# Patient Record
Sex: Female | Born: 1995 | Race: Asian | Hispanic: No | Marital: Married | Smoking: Never smoker
Health system: Southern US, Community
[De-identification: ages and names within clinical notes are randomized; demographics above are authoritative.]

## PROBLEM LIST (undated history)

## (undated) ENCOUNTER — Emergency Department (HOSPITAL_COMMUNITY): Admission: EM | Payer: Medicaid Other

## (undated) DIAGNOSIS — I10 Essential (primary) hypertension: Secondary | ICD-10-CM

## (undated) DIAGNOSIS — O139 Gestational [pregnancy-induced] hypertension without significant proteinuria, unspecified trimester: Secondary | ICD-10-CM

## (undated) DIAGNOSIS — Z789 Other specified health status: Secondary | ICD-10-CM

## (undated) DIAGNOSIS — O24419 Gestational diabetes mellitus in pregnancy, unspecified control: Secondary | ICD-10-CM

## (undated) DIAGNOSIS — Z8759 Personal history of other complications of pregnancy, childbirth and the puerperium: Secondary | ICD-10-CM

## (undated) DIAGNOSIS — Q437 Persistent cloaca: Secondary | ICD-10-CM

## (undated) HISTORY — DX: Gestational diabetes mellitus in pregnancy, unspecified control: O24.419

## (undated) HISTORY — PX: NO PAST SURGERIES: SHX2092

## (undated) HISTORY — DX: Gestational (pregnancy-induced) hypertension without significant proteinuria, unspecified trimester: O13.9

## (undated) HISTORY — DX: Other specified health status: Z78.9

## (undated) HISTORY — DX: Essential (primary) hypertension: I10

---

## 2018-04-19 DIAGNOSIS — Z9889 Other specified postprocedural states: Secondary | ICD-10-CM

## 2019-06-26 HISTORY — PX: RECTOVAGINAL FISTULA CLOSURE: SUR265

## 2020-06-02 ENCOUNTER — Ambulatory Visit (INDEPENDENT_AMBULATORY_CARE_PROVIDER_SITE_OTHER): Payer: Medicaid Other | Admitting: Family Medicine

## 2020-06-02 ENCOUNTER — Ambulatory Visit (HOSPITAL_COMMUNITY)
Admission: RE | Admit: 2020-06-02 | Discharge: 2020-06-02 | Disposition: A | Discharge status: Home / Self Care | Payer: Medicaid Other | Source: Ambulatory Visit | Attending: Family Medicine | Admitting: Family Medicine

## 2020-06-02 ENCOUNTER — Other Ambulatory Visit: Payer: Self-pay

## 2020-06-02 VITALS — BP 110/72 | HR 105 | Ht 64.25 in | Wt 141.2 lb

## 2020-06-02 DIAGNOSIS — Z0289 Encounter for other administrative examinations: Secondary | ICD-10-CM | POA: Diagnosis not present

## 2020-06-02 DIAGNOSIS — R9431 Abnormal electrocardiogram [ECG] [EKG]: Secondary | ICD-10-CM | POA: Diagnosis not present

## 2020-06-02 DIAGNOSIS — N823 Fistula of vagina to large intestine: Secondary | ICD-10-CM | POA: Diagnosis not present

## 2020-06-02 DIAGNOSIS — O26892 Other specified pregnancy related conditions, second trimester: Secondary | ICD-10-CM | POA: Insufficient documentation

## 2020-06-02 DIAGNOSIS — Z3492 Encounter for supervision of normal pregnancy, unspecified, second trimester: Secondary | ICD-10-CM | POA: Diagnosis not present

## 2020-06-02 DIAGNOSIS — Z3490 Encounter for supervision of normal pregnancy, unspecified, unspecified trimester: Secondary | ICD-10-CM

## 2020-06-02 DIAGNOSIS — Z3A18 18 weeks gestation of pregnancy: Secondary | ICD-10-CM | POA: Insufficient documentation

## 2020-06-02 DIAGNOSIS — Z3403 Encounter for supervision of normal first pregnancy, third trimester: Secondary | ICD-10-CM | POA: Insufficient documentation

## 2020-06-02 LAB — POCT URINALYSIS DIP (MANUAL ENTRY)
Bilirubin, UA: NEGATIVE
Blood, UA: NEGATIVE
Glucose, UA: NEGATIVE mg/dL
Leukocytes, UA: NEGATIVE
Nitrite, UA: NEGATIVE
Protein Ur, POC: NEGATIVE mg/dL
Spec Grav, UA: >=1.03 — AB (ref 1.010–1.025)
Urobilinogen, UA: 0.2 E.U./dL
pH, UA: 5.5 (ref 5.0–8.0)

## 2020-06-02 LAB — POCT URINE PREGNANCY: Preg Test, Ur: POSITIVE — AB

## 2020-06-02 MED ORDER — ASPIRIN EC 81 MG PO TBEC: 81.0000 mg | DELAYED_RELEASE_TABLET | Freq: Every day | ORAL | 2 refills | Status: DC

## 2020-06-02 MED ORDER — PRENATAL MULTIVITAMIN CH: 1.0000 | ORAL_TABLET | Freq: Every day | ORAL | 2 refills | Status: DC

## 2020-06-02 NOTE — Progress Notes (Signed)
Patient Name: Kelsey Mcdaniel Date of Birth: [birth year] Date of Visit: 06/02/20 PCP: Lenoria Chime, MD  Chief Complaint: initial OB exam, new patient  The patient's preferred language is Pashto. An interpreter was used for the entire visit.  Interpreter Name or ID: Abdur, video interpretor  Later called patient back to clarify history with female interpretor ID#: [id number]   Subjective: Kelsey Mcdaniel is a pleasant 24 y.o. presenting today for an initial refugee and immigrant clinic visit as well as initial prenatal visit. + fetal movement, no vaginal bleeding or leakage of fluid, no contractions or abdominal pain.  Her LMP was 5 months ago. Records from the [employer] [facility] show ultrasound performed on 05/16/20 showing [redacted]w[redacted]d gestational age with EDD by [place] 10/14/20. This was done at [facility]. AFI was normal with MVP 3.3 cm. EFW 219g. AC also measuring at 108w4d. This has been scanned into chart.  Vaccination records show she possibly received varicella and MMR vaccines on 03/13/20- these are contraindicated in pregnancy. She was given flu vaccine on 04/07/20. She was given J&J vaccine on 03/18/20.  Records show CXR screening negative for TB.   Records also showed an episode of syncope vs pre-syncope after morning sickness with nausea/vomiting on 04/13/20, her ECG was reported to have prolonged QTc and she was referred to cardiology, she states she never went and her nausea and vomiting has currently resolved and she has no further sx. Also denies any history of syncope, chest pain, palpitations at other times in this pregnancy or outside of pregnany.  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 She notes this episiotomy cut into the rectum leading to fistula that needed to be repaired. She denies trouble with diabetes, but does not she was told about 7 months in that she had very high blood pressures and had hands and feet swelling. She denies seizures or being told that she had pre-eclampsia.   PSH: Episiotomy with recto-vaginal fistula- fistula repaired 5 months postpartum  FH: Father had heart disease  Allergies:  None  Current Medications:  None- not currently taking a PNV because doesn't want her baby to grow too big  Social History: Tobacco Use: denies Alcohol Use: denies  Vitals:   06/02/20 0910  BP: 110/72  Pulse: (!) 105   HEENT: Sclera anicteric. Appears well hydrated. Neck: Supple Lungs: normal work of breathing  Abdomen: No tenderness to deep or light palpation. No rebound or guarding. Abdomen palpated without tenderness, uterine fundus noted just at level of umbilicus. Skin: no rash Psych: Pleasant and appropriate  MSK: non-antalgic gait  ECG read and interpreted by myself: NSR, normal axis, rate 72 bpm, QTc 400 ms, no ST segment changes or T wave inversions, no signs of LVH  Assessment & Plan:  [facility] IUP - per prior 18wk sono, repeat ultrasound scheduled next week - initial OB labs today + Hgb electrophoresis and lead level - deferred pelvic  exam at this time of first visit- she will be due for pap smear in the future as well  History of recto-vaginal fistula s/p repair - will refer to OB, discussed with patient risk of recurrence/affect this may have if she is wanting to have vaginal delivery again, discussed they may recommend  Cesarean section based on her history  History of pre-syncope vs syncope- per chart [facility], had hyperemesis sx with a prolonged QTc and presented with syncope that got better with IVF, ECG today normal and patient notes she has had no history of syncope, arrhythmia, chest pain, or other abnormality while pregnant or outside of pregnancy. Suspect abnormality on ECG may have been due to electrolyte abnormality and pre-syncope 2/2 dehydration, discussed with patient and her husband and no further work-up to be pursued at this time, did discussed precautions and reasons to call.  Late to [facility] - given script for PNV, repeat US scheduled for next week for anatomy/to see if consistent dating as prior US.  History of high blood pressure in pregnancy- per patient report, given script for 81 mg aspirin to start today for pre-E prevention. Blood pressure normal today.  Of note, strong preference for female providers and female interpretors. Had difficulty discussing fistula and clarifying what happened with female interpretor and needed to call back with female phone interpretor.   Designated Partner Release signed with agency 06/02/20.   Release of information signed for Health Department 06/02/20- she states that she has not been to the health department yet.   Return to care in 2 weeks [facility] with PCP Dr Thompson Grayer for follow-up OB and refugee initial intake exam.   Vaccines: Per records, reviewed MMR and varicella vaccines while pregnant. Consider placing on registry.

## 2020-06-02 NOTE — Patient Instructions (Signed)
It was wonderful to see you today.  Please bring ALL of your medications with you to every visit.   Today we talked about:  Scheduled [place] F/u appt with me EKG looked okay- heart is okay and we will monitor for symptoms  Thank you for choosing [facility].   Please call [phone] with any questions about today's appointment.  Please be sure to schedule follow up at the front  desk before you leave today.   Burley Saver, MD  Family Medicine

## 2020-06-04 LAB — URINE CULTURE, OB REFLEX: Organism ID, Bacteria: NO GROWTH

## 2020-06-04 LAB — CULTURE, OB URINE

## 2020-06-06 LAB — OBSTETRIC PANEL, INCLUDING HIV
Antibody Screen: NEGATIVE
Basophils Absolute: 0.2 10*3/uL (ref 0.0–0.2)
Basos: 2 %
EOS (ABSOLUTE): 0.5 10*3/uL — ABNORMAL HIGH (ref 0.0–0.4)
Eos: 4 %
HIV Screen 4th Generation wRfx: NONREACTIVE
Hematocrit: 30.8 % — ABNORMAL LOW (ref 34.0–46.6)
Hemoglobin: 10.5 g/dL — ABNORMAL LOW (ref 11.1–15.9)
Hepatitis B Surface Ag: NEGATIVE
Immature Grans (Abs): 0 10*3/uL (ref 0.0–0.1)
Immature Granulocytes: 0 %
Lymphocytes Absolute: 2.8 10*3/uL (ref 0.7–3.1)
Lymphs: 22 %
MCH: 29.5 pg (ref 26.6–33.0)
MCHC: 34.1 g/dL (ref 31.5–35.7)
MCV: 87 fL (ref 79–97)
Monocytes Absolute: 0.7 10*3/uL (ref 0.1–0.9)
Monocytes: 6 %
Neutrophils Absolute: 8.6 10*3/uL — ABNORMAL HIGH (ref 1.4–7.0)
Neutrophils: 66 %
Platelets: 282 10*3/uL (ref 150–450)
RBC: 3.56 x10E6/uL — ABNORMAL LOW (ref 3.77–5.28)
RDW: 13.4 % (ref 11.7–15.4)
RPR Ser Ql: NONREACTIVE
Rh Factor: POSITIVE
Rubella Antibodies, IGG: 25.1 index (ref >0.99)
WBC: 12.9 10*3/uL — ABNORMAL HIGH (ref 3.4–10.8)

## 2020-06-06 LAB — COMPREHENSIVE METABOLIC PANEL
ALT: 11 IU/L (ref 0–32)
AST: 14 IU/L (ref 0–40)
Albumin/Globulin Ratio: 0.9 — ABNORMAL LOW (ref 1.2–2.2)
Albumin: 3.4 g/dL — ABNORMAL LOW (ref 3.9–5.0)
Alkaline Phosphatase: 41 IU/L — ABNORMAL LOW (ref 44–121)
BUN/Creatinine Ratio: 15 (ref 9–23)
BUN: 8 mg/dL (ref 6–20)
Bilirubin Total: 0.4 mg/dL (ref 0.0–1.2)
CO2: 21 mmol/L (ref 20–29)
Calcium: 8.6 mg/dL — ABNORMAL LOW (ref 8.7–10.2)
Chloride: 102 mmol/L (ref 96–106)
Creatinine, Ser: 0.52 mg/dL — ABNORMAL LOW (ref 0.57–1.00)
GFR calc Af Amer: 155 mL/min/{1.73_m2} (ref >59)
GFR calc non Af Amer: 134 mL/min/{1.73_m2} (ref >59)
Globulin, Total: 3.7 g/dL (ref 1.5–4.5)
Glucose: 81 mg/dL (ref 65–99)
Potassium: 4.2 mmol/L (ref 3.5–5.2)
Sodium: 135 mmol/L (ref 134–144)
Total Protein: 7.1 g/dL (ref 6.0–8.5)

## 2020-06-06 LAB — LIPID PANEL
Chol/HDL Ratio: 3.8 ratio (ref 0.0–4.4)
Cholesterol, Total: 163 mg/dL (ref 100–199)
HDL: 43 mg/dL (ref >39)
LDL Chol Calc (NIH): 93 mg/dL (ref 0–99)
Triglycerides: 152 mg/dL — ABNORMAL HIGH (ref 0–149)
VLDL Cholesterol Cal: 27 mg/dL (ref 5–40)

## 2020-06-06 LAB — LEAD, BLOOD (ADULT >= 16 YRS): Lead-Whole Blood: 3 ug/dL (ref 0–4)

## 2020-06-06 LAB — HCV INTERPRETATION

## 2020-06-06 LAB — HGB FRACTIONATION CASCADE
Hgb A2: 2.9 % (ref 1.8–3.2)
Hgb A: 97.1 % (ref 96.4–98.8)
Hgb F: 0 % (ref 0.0–2.0)
Hgb S: 0 %

## 2020-06-06 LAB — HCV AB W REFLEX TO QUANT PCR: HCV Ab: <0.1 s/co ratio (ref 0.0–0.9)

## 2020-06-06 LAB — TSH: TSH: 2.79 u[IU]/mL (ref 0.450–4.500)

## 2020-06-06 LAB — HEPATITIS B SURFACE ANTIBODY, QUANTITATIVE: Hepatitis B Surf Ab Quant: 245.7 m[IU]/mL (ref >9.9)

## 2020-06-06 LAB — VARICELLA ZOSTER ANTIBODY, IGG: Varicella zoster IgG: 601 index (ref >165)

## 2020-06-06 LAB — HEPATITIS B CORE ANTIBODY, TOTAL: Hep B Core Total Ab: NEGATIVE

## 2020-06-10 ENCOUNTER — Other Ambulatory Visit: Payer: Self-pay

## 2020-06-10 ENCOUNTER — Ambulatory Visit: Payer: Medicaid Other | Admitting: *Deleted

## 2020-06-10 ENCOUNTER — Encounter: Payer: Self-pay | Admitting: *Deleted

## 2020-06-10 ENCOUNTER — Ambulatory Visit: Payer: Medicaid Other | Attending: Family Medicine

## 2020-06-10 VITALS — BP 115/57 | HR 91

## 2020-06-10 DIAGNOSIS — Z8759 Personal history of other complications of pregnancy, childbirth and the puerperium: Secondary | ICD-10-CM | POA: Diagnosis present

## 2020-06-10 DIAGNOSIS — Z3490 Encounter for supervision of normal pregnancy, unspecified, unspecified trimester: Secondary | ICD-10-CM | POA: Diagnosis present

## 2020-06-14 DIAGNOSIS — D721 Eosinophilia, unspecified: Secondary | ICD-10-CM | POA: Insufficient documentation

## 2020-06-14 NOTE — Progress Notes (Signed)
  Patient Name: Kelsey Mcdaniel Date of Birth: 01-25-1996 Date of Visit: 06/15/20 PCP: Billey Co, MD  Chief Complaint: follow-up OB visit   The patient's preferred language is Pashto. An interpreter was used for the entire visit.  Interpreter Name or ID: Janene Harvey, in person interpretor    Subjective: Rumor Reach is a pleasant 24 y.o. presenting today for a follow-up obstetrics visit.  She was referred to OB at our last appointment due to her history of recto-vaginal fistula s/p repair in 2019 after the birth of her first child. She has not heard about an appointment with them yet.   ROS: + fetal movement, no vaginal bleeding or leakage of fluid, no contractions  no headaches, no vision changes, no edema, no RUQ pain, no chest pain, no shortness of breath   Current Medications:  Aspirin 81 mg daily Prenatal vitamin   OBJECTIVE:  Vitals:   06/15/20 1455  BP: 112/62  Pulse: 91   General: alert & oriented, no apparent distress, well groomed HEENT: normocephalic, atraumatic, EOM grossly intact, oral mucosa moist, neck supple Respiratory: normal respiratory effort GI: non-distended, fundus 1 cm below umbilicus Skin: no rashes, no jaundice, no skin incisions noted on abdomen Psych: appropriate mood and affect  FHT: 152 bpm by bedside doppler  ASSESSMENT & PLAN  [place] IUP Late to Prenatal Care - dating by 18wk sono, consistent with recent anatomy sono on 06/10/20 at 22.0 WGA, discussed normal anatomy ultrasound results today - pap smear due, pt declined today, plan at next visit or with OB provider - need [employer] booster vaccine, discussed with patient and she plans to follow at the health department, permission given to email case manager to discuss, discussed increased risk of blood clots with J&J vaccine - Initial OB lab work normal except for eosinophilia noted, Hgb 10.5 which is lower limit of normal for second trimester, will recheck at 28 weeks, Hgb  electrophoresis normal without signs of thalassemia  History of Gestational HTN- continue aspirin 81 mg daily, baseline labs collected at initial refugee appointment, blood pressure appropriate today  History of recto-vaginal fistula- called to scheduled OB appointment today on 06/30/2020, plan to establish prenatal care there due to complex medical history  Eosinophilia- mild, noted on CBC w/diff, presumptive parasite treatment not recommended in pregnant women per CDC, plan to repeat post-partum as patient is asymptomatic at this time, could treat with albenzdazole and ivermectin postpartum if eosinophilia persists.  F/u with OB scheduled for prenatal care, can resume care with me after pregnancy or for other issues outside of pregnancy.

## 2020-06-15 ENCOUNTER — Ambulatory Visit (INDEPENDENT_AMBULATORY_CARE_PROVIDER_SITE_OTHER): Payer: Medicaid Other | Admitting: Family Medicine

## 2020-06-15 ENCOUNTER — Other Ambulatory Visit: Payer: Self-pay

## 2020-06-15 VITALS — BP 112/62 | HR 91 | Wt 145.0 lb

## 2020-06-15 DIAGNOSIS — Z3492 Encounter for supervision of normal pregnancy, unspecified, second trimester: Secondary | ICD-10-CM

## 2020-06-15 DIAGNOSIS — D721 Eosinophilia, unspecified: Secondary | ICD-10-CM

## 2020-06-15 DIAGNOSIS — Z0289 Encounter for other administrative examinations: Secondary | ICD-10-CM

## 2020-06-15 DIAGNOSIS — N823 Fistula of vagina to large intestine: Secondary | ICD-10-CM

## 2020-06-15 MED ORDER — ACETAMINOPHEN 500 MG PO TABS: 500.0000 mg | ORAL_TABLET | Freq: Four times a day (QID) | ORAL | 1 refills | Status: DC | PRN

## 2020-06-15 NOTE — Patient Instructions (Addendum)
It was wonderful to see you today.  Please bring ALL of your medications with you to every visit.   Today we talked about:  - Referral to OB GYN due to your history of rectovaginal fistula -  Follow up at health department for second J&J COVID vaccination   Thank you for choosing [facility].   Please call [phone] with any questions about today's appointment.  Please be sure to schedule follow up at the front  desk before you leave today.   Burley Saver, MD  Family Medicine

## 2020-06-25 NOTE — L&D Delivery Note (Addendum)
Delivery Note Kelsey Mcdaniel is a 25 y.o. G2P1001 at [redacted]w[redacted]d admitted for labor.   GBS Status:  Negative/-- (03/25 1016) Maximum Maternal Temperature: 98.8  Labor course: Initial SVE: 3/80/-2. Augmentation with: AROM. She then progressed to complete.  ROM: 1h 85m with light meconium fluid  Birth: As pt pushing, ~2cm hole visible b/w perineum and anus, this tore as head birthed. At 2207 a viable female was delivered via spontaneous vaginal delivery (Presentation: ROA). Nuchal cord present: Yes, loose, delivered immediately after birth of baby.  Shoulders and body delivered in usual fashion. Infant placed directly on mom's abdomen for bonding/skin-to-skin, baby dried and stimulated. Cord clamped x 2 after 1 minute and cut by FOB.  Cord blood collected.  The placenta separated spontaneously and delivered via gentle cord traction.  Pitocin infused rapidly IV per protocol.  Fundus firm with massage.  Placenta inspected and appears to be intact with a 3 VC.  Placenta/Cord with the following complications: none .  Cord pH: n/a Sponge and instrument count were correct x2.  Intrapartum complications:  None Anesthesia:  IV pain meds Episiotomy: none Lacerations:  No vaginal lacs, perineum appears intact, rectal exam reveals 4th degree. Pt adamant does not want female MD to repair. [name] not credentialed to repair 4th degree. Pt wants to wait to repair until female MD available. [name] to try to call in female MD for evaluation/repair.    [name] in and repaired 4th degree in usual fashion-please see her note Initial EBL: at birth, another during 4th degree lac repair- had steady oozing, clots removed from LUS, TXA given Total EBL (mL):  Infant: APGAR (1 MIN): 9   APGAR (5 MINS): 9   APGAR (10 MINS):    Infant weight: pending  Mom to postpartum.  Baby to Couplet care / Skin to Skin. Placenta to L&D   Plans to Breastfeed Contraception: condoms Circumcision: N/A   PP note  not routed- pt [facility]  Cheral Marker CNM, [facility] 10/11/2020 10:52 PM

## 2020-06-30 ENCOUNTER — Other Ambulatory Visit: Payer: Self-pay

## 2020-06-30 ENCOUNTER — Encounter: Payer: Self-pay | Admitting: Obstetrics & Gynecology

## 2020-06-30 ENCOUNTER — Ambulatory Visit (INDEPENDENT_AMBULATORY_CARE_PROVIDER_SITE_OTHER): Payer: Medicaid Other | Admitting: Obstetrics & Gynecology

## 2020-06-30 VITALS — BP 116/73 | HR 96 | Wt 147.7 lb

## 2020-06-30 DIAGNOSIS — Z3492 Encounter for supervision of normal pregnancy, unspecified, second trimester: Secondary | ICD-10-CM | POA: Diagnosis not present

## 2020-06-30 DIAGNOSIS — Z8759 Personal history of other complications of pregnancy, childbirth and the puerperium: Secondary | ICD-10-CM

## 2020-06-30 DIAGNOSIS — Z789 Other specified health status: Secondary | ICD-10-CM

## 2020-06-30 NOTE — Progress Notes (Signed)
History:   Kelsey Mcdaniel is a 25 y.o. G2P1001 at [redacted]w[redacted]d by second trimester ultrasound is being seen today for her first obstetrical visit and for consultation regarding possible vaginal delivery with this pregnancy vs elective cesarean section.  Pt has recently moved to the [place] from [place].  With her first pregnancy in 2020, she had a vaginal breech delivery of her first child.  She reports having an episiotomy cut with her delivery.  After her delivery and the healing process, she developed a fistula.  Exact details of this are unavailable but she reports having leakage of stool from the vagina daily.  So, it is most likely she developed a rectovaginal fistula.  Pt reports she and her husband had to sell all of their belongings to be able to afford the surgical repair.  This was completed in [place] and she did well with the surgery.  She is fully continent of both urine and stool.  She denies pain with intercourse or any pelvic pain.  She denies pain with urination.  Her first child is doing well.  With current pregnancy, she denies vaginal bleeding or discharge.  She had good fetal movement.  She denies any leakage of fluid.     HISTORY: OB History  Gravida Para Term Preterm AB Living  2 1 1  0 0 1  SAB IAB Ectopic Multiple Live Births  0 0 0 0 1    # Outcome Date GA Lbr Len/2nd Weight Sex Delivery Anes PTL Lv  2 Current           1 Term 04/20/19    F Vag-Breech None N LIV     Complications: History of episiotomy    Last pap smear is not in Epic.  Pt reports she's never been told she had an abnormal pap smear.  Past Medical History:  Diagnosis Date  . Pregnancy induced hypertension    Past Surgical History:  Procedure Laterality Date  . RECTOVAGINAL FISTULA CLOSURE  2021   Family History  Problem Relation Age of Onset  . Heart disease Father    Social History   Tobacco Use  . Smoking status: Never Smoker  . Smokeless tobacco: Never Used  Vaping Use  . Vaping Use:  Never used  Substance Use Topics  . Alcohol use: Never  . Drug use: Never   No Known Allergies Current Outpatient Medications on File Prior to Visit  Medication Sig Dispense Refill  . acetaminophen (TYLENOL) 500 MG tablet Take 1 tablet (500 mg total) by mouth every 6 (six) hours as needed. (Patient not taking: Reported on 06/30/2020) 60 tablet 1  . aspirin EC 81 MG tablet Take 1 tablet (81 mg total) by mouth daily. Swallow whole. (Patient not taking: No sig reported) 60 tablet 2  . Prenatal Vit-Fe Fumarate-FA (PRENATAL MULTIVITAMIN) TABS tablet Take 1 tablet by mouth daily at 12 noon. (Patient not taking: No sig reported) 60 tablet 2   No current facility-administered medications on file prior to visit.    Review of Systems Pertinent items noted in HPI and remainder of comprehensive ROS otherwise negative.  Physical Exam:   Vitals:   06/30/20 1413  BP: 116/73  Pulse: 96  Weight: 147 lb 11.2 oz (67 kg)   Fetal Heart Rate (bpm): 155  Physical Exam Exam conducted with a chaperone present.  Constitutional:      Appearance: Normal appearance. She is normal weight.  Abdominal:     Palpations: Abdomen is soft.  Hernia: There is no hernia in the left inguinal area or right inguinal area.     Comments: Fundus soft and measures 24 cm in height  Genitourinary:    General: Normal vulva.     Labia:        Right: No tenderness, lesion or injury.        Left: No tenderness, lesion or injury.      Urethra: No urethral lesion.     Vagina: Normal.     Rectum: Normal. Normal anal tone.     Comments: Normal anatomy and normal size of perineal body. Lymphadenopathy:     Lower Body: No left inguinal adenopathy.  Neurological:     Mental Status: She is alert.     Assessment:    Pregnancy: G2P1001 Patient Active Problem List   Diagnosis Date Noted  . History of gestational hypertension 06/30/2020  . Eosinophilia 06/14/2020  . Supervision of low-risk pregnancy, second trimester  06/02/2020  . Encounter for health examination of refugee 06/02/2020  . Rectovaginal fistula 06/02/2020     Plan:    1. History of postpartum fistula -Lengthy discussion with pt occurred.  She understands that her fistula was most likely due to vaginal breech extraction with first pregnancy after episiotomy was cut and that risks for this occurring again if has baby who is in vertex presentation is less likely.  Of course, it is reasonable to offer a elective cesarean section for this pregnancy.  She is aware her repair looks very good but that scarred tissue is more likely to tear than tissue that has never been torn, repaired.  She would very much like a trial of labor.  I think this is reasonable as long as the baby is not large for gestational age and is in vertex presentation.  I would recommend a 36 week ultrasound for estimation of weight.  Limitations of late ultrasounds for weight determination were discussed as well.   2. Supervision of low-risk pregnancy, second trimester -Pt feels very comfortable with Dr. Miquel Dunn and [facility] providing obstetrical care.  Will communicate with Dr. Miquel Dunn about pt's care.  We are very happy to take care of her if that is desired by [facility].  3. History of gestational hypertension -on baby ASA and PNV  4. Language barrier -translator used throughout visit for all communication.  40 minutes of total time was spent for this patient encounter, including preparation, face-to-face counseling with the patient and coordination of care, and documentation of the encounter.   Lum Keas, MD, FACOG Obstetrician & Gynecologist, [facility] for [facility], [facility]

## 2020-07-04 ENCOUNTER — Encounter: Payer: Self-pay | Admitting: Obstetrics & Gynecology

## 2020-07-11 NOTE — Progress Notes (Deleted)
HPI:  Patient is a 25 y.o. G2P1001 at [redacted]w[redacted]d by *** presenting for prenatal visit. Pregnancy was planned.  Concerns today:   Review of Systems: + fetal movement, denies vaginal bleeding, leakage of fluid, or contractions.   EDD: LMP: _ , Sharlyne Cai EDD by LMP: _, documented on _ EDD by U/S: _, U/S performed on _ at _ weeks Pregnancy dated by: _   Past Pregnancy History .gravidapara  .pastpregnancyhistory  Pregnancy #1_: _ / viable _male infant / NSVD_ LTCS_ for _ / _ wga / no complications_ / weight_ / hospital_  Past Medical History Patient denies personal history of the following: DM, HTN, heart disease, autoimmune disorders, renal disease, seizure disorder, hepatitis or liver disease, varicosities or phlebitis, thyroid dysfunction, blood transfusions, D (Rh) isoimmunization or sensitization, pulmonary disease (TB, asthma), or uterine anomalies. She denies mental health disorders or history/current domestic violence.  Past Surgical History Patient denies history of breast or gynecological surgeries.  Past OB/GYN History Pap smear: last done on _ & did not_ show any abnormalities, denies history of abnormal paps STD History: _  Genetic Screening: (includes patient, babies father, and any family members- if positive specify relation) Genetic disorders including thalassemia, neural tube defects, congenital heart defect, down syndrome, tay sachs, sickle cell disease or trait, hemophilia, muscular dystrophy, CF, huntingtons chorea, mental retardation/autism, or any other genetic disorders or congenital anomalies  Family History: (infant or childhood deaths, diabetes, HTN, bleeding or clotting disorders, recurrent pregnancy history)  Risk Factors: Medications used since LMP: _none Tobacco: _denies smoking or history of tobacco use Alcohol: _denies Drug use: _denies Exercise: _ BMI: _ on _ Age: <35_  Hypertension risk: none_, reviewed on _ Risk for preeclampsia  according to USPSTF & ACOG High risk (if 1 or more, start 81 mg aspirin daily at 12-16 wga, stop 5 days prior to delivery): previous pregnancy with preeclampsia, multifetal gestation, chronic hypertension, T1DM or T2DM, renal disease, autoimmune disease (APLS, SLE) Moderate risk (if 2 or more, start aspirin 81 mg daily at 12-16 wga, stop 5 days prior to delivery): nulliparous, obese (BMI >30), family history of preeclampsia in mother or sister, >63 years old, AA, low SES, history of low birth weight or SGA, previous adverse pregnancy outcome, >10 year inter-pregnancy interval DM risk: none_, reviewed on _ Screen if obese (BMI >25 or Asian BMI >23) & one or more risk factor: physical inactivity, 1st degree relative with DM, previous GDM, previous macrosomic infant (greater than 4000 gm), high risk ethnicities for DM, HTN, PCOS, or signs of metabolic disorders. Use 1 hour GTT. If >135, get HgbA1c to assess for prior DM, and 3 hour GTT for confirmation. Preterm labor risk: none_, reviewed on _ Delivery prior to 37 WGA, consider 17 hydroxyprogesterone weekly injections from 16-36 weeks, must be started prior to 21 weeks  Genetic Testing [ _ ] Offer genetic testing: Quad screen at 16-21 wks, ideally 18 wks (done at our clinic) or refer to MFM for cell free DNA and/or neural tube defect screening done by nuchal translucency ideally done at 13 WGA (or other genetic screening) _Date offered _Test or referral completed _Patient unsure, will contact office if she desires testing _Patient declined [ _ ] CF carrier screening offered (Should be offered to all women) _Date offered _Test or referral completed _Patient unsure, will contact office if she desires testing _Patient declined [ _ ] Hemoglobin Electrophoresis indicated and ordered (Indicated if high risk ethnicity - African, Mediterranean, [place], or [place] descent OR low MCV/MCH)   Home Medications:  Allergies:  NKDA  OBJECTIVE .vitals_ Delete PE after IOB unless you perform specific components General: A&Ox3 in NAD, sitting on exam table comfortably, well nourished, well dressed, appears stated age HEENT: [place]/AT, gross hearing intact, moist mucous membranes, no oral lesions Breast Exam: Symmetric and nipples are everted. No masses palpated and no lymphadenopathy in axilla. Cardiac: RRR, +S1/S2, no murmurs, gallops or clicks Resp: CTA bilaterally, no R/R/W, no increased work of breathing Abdomen: gravid, ND/NT, no RUQ tenderness, no rebound, + normoactive BS x4 quadrants, no organomegaly Extremities: no cyanosis, pulses 2+ distally, no edema, no clubbing Psych: no unusual anxiety or evidence of depression, interacts appropriately with examiner GU: external female genitalia is normal without lesions or skin changes, vagina has normal pink mucosa with small_ amount of thin white discharge, no lesions noted, cervix appears non-friable and without obvious lesions or polyps, pap test_ collected. Uterine size is consistent with _ WGA.  Recommended weight gain: BMI < 18.5 = 28-40 lbs or 13-18 kg (1 lb/week or 0.5 kg/week in 2nd and 3rd tri) BMI 18.5 - 24.9 = 25-35 lbs or 11-16 kg (1 lb/week or 0.5 kg/week in 2nd and 3rd tri) BMI 25 - 29.9 = 15-25 lbs or 7-11 kg (0.6 lb/week or 0.3 kg/week in 2nd and 3rd tri) BMI 30 or greater = 11-20 lbs or 5-9 kg (0.5 lb/week or 0.2kg/week in 2nd and 3rd tri)  Prenatal Labs Early 1 hour GTT (goal <135): _ on _, passed _ If abnormal 1 hour GTT, 3 hour GTT: fasting _ (<95), 1 hour _ (<180), 2 hour _ (<155), 3 hour _ (<140) Blood Type & Rh: _ Antibody screen: _ Hgb: _ Platelet count: _ HbsAg: _ Rubella: _ RPR: _ HIV: _ Gonorrhea: _ Chlamydia: _ Affirm:_ Urine culture: _ Pap Smear: _ Genetic screening: _  28 week labs 1 hour GTT (goal <135) : _ on _, passed_ If abnormal 1 hour GTT, 3 hour GTT: fasting _ (<95), 1 hour _ (<180), 2 hour _ (<155), 3 hour _  (<140) Repeat Hgb: _ (do on everyone) Repeat Plts: _ (do on everyone) RPR: _ (do on everyone) Antibody Screen __ (do on Rh negative pts before Rhogam is given) HIV: _ (repeat if high risk or STI this pregnancy) Hep C ab:_ (repeat if high risk or STI this pregnancy)  36 week labs GBS: _ on _ Gonorrhea: __ (repeat if high risk or STI this pregnancy) Chlamydia: __ (repeat if high risk or STI this pregnancy) HIV: _ (repeat if high risk or STI this pregnancy)   Prenatal Check List: [ _ ] U/S for fetal anatomic survey: around 20 weeks (check cervical length by transvaginal if history of LEEP or PTD, repeat if placenta previa or low lying. 20 wks ideal for checking cardiac outflow tract) [ _ ] Referral to [employer] (<32 weeks, English speaking) [ _ ] Dental visit reccomended if not routinely seeing dentist [ _ ] Influenza vaccination (at any point in pregnancy once vaccine is available) [ _ ] Rhogam: 28 wks (if Rh negative, order antibody screen under blood bank before injection is done, can be done the same day) [ _ ] GDM screening: 28 weeks (1 hour GTT or 2 hour GTT) [ _ ] Anemia screening: 28 weeks or 36 weeks (CBC, start Fe if Hgb <10.5, repeat at 36 weeks if anemic to monitor) [ _ ] Tdap vaccine: 28-36 weeks [ _ ] Repeat STD screening (  RPR on all patients & HIV/Hep C if previous STI or high risk) [ _ ] Rectovaginal GBS swab/culture: 35-37 weeks (If PCN allergic w h/o anaphylaxis, include order for Abx sensitivities to determine if pt will need clinda, erythro or vanco) [ _ ] STI Screening: 36 weeks (re-screen for all STI at 2336 wga if had STI this pregnancy or deemed high risk - include HIV, Hep C, RPR, & swabs) [ _ ] Assess for breech presentation: at visits after 36 weeks (may be candidate for ECV) [ _ ] RLTCS scheduled: 39 weeks [ _ ] IOL scheduled by 41 WGA (If IOL declined at 41 WGA, serial fetal surveillance with twice weekly NST and weekly AFI measurements. IOL at 39-41 wks for non  medical indications acceptable if discussed with faculty on call)   OB Education and Counseling Checklist: [ _ ] Expected course of prenatal care [ _ ] Continuing education - patient was provided a list of helpful websites, apps, and materials for reliable information about her pregnancy [ _ ] Prenatal vitamins Calcium/Folic Acid/Multivitamin prescribed (Calcium 1200-1500mg /day; reduces risk of pre-eclampsia if insufficient intake. Folic acid supplementation [id number]/day for primary prevention (up to 4 mg/day if increased risk NTD) ideally from 4 weeks pre-conception through first trimester, supplement is in addition to RDA of 600mcg daily from food) [ _ ] Ultrasounds Discussed initial sono for dating and fetal anatomical survey at 20 weeks unless other indications [ _ ] Nutrition counseling Discussed needing only 100-300 additional calories to support a baby, importance of taking prenatal vitamin for increased folic acid intake, avoidance of raw meat, deli meat, soft chesses, and large fish, avoidance of alcohol tobacco and drugs completely during pregnancy, as well as to limit caffeine, and wash fruits and vegetables prior to consumption. [ _ ] Domestic violence screening (pt reports that she feels safe at home, no history of abuse) [ _ ] Medications Discussed avoiding unnecessary medications. May use OTC tums, Tylenol, Zantac, and Benadryl. Also instructed to contact our office if considering taking any other medications. Provided list of common medications and which to avoid. [ _ ] Hospitals Discussed that as a group, [facility] and [facility] [ _ ] Exercise & recommended weight gain Healthy women with uncomplicated pregnancies should continue to exercise 30-60 minutes most days of the week. [ _ ] Seat belts Continue to wear seat belt with lap belt across the hips and below the uterus. [ _ ] Signs of  preeclampsia Headaches not relieved by Tylenol, change in vision, shortness of air, right upper quadrant pain [ _ ] [facility] movement [facility] if significant change in movement from baseline, monitor kick counts after 28 weeks, can use Count the Kicks app [ _ ] Signs of preterm labor Discussed signs/symptoms of preterm labor, including LOF, vaginal bleeding, and contractions. Instructed to go to hospital if these occur. [ _ ] Vaginal discharge [ _] What to excpect during labor and at the hospital FSE, IUPC, Anesthesia, AROM, c-section indications, operative vaginal delivery indications, NICU presence, skin to skin [ _ ] Newborn provider: _ [ _ ] Breastfeeding vs bottle feeding: _ Breastfeeding benefits for baby: lower risk of infection (ieAOM, RSV), asthma, childhood cancers, bonding, cost-effective Benefits for mom: lowers risk for breast cancer/diabetes/HTN, bonding, and helps with weight loss [ _ ] Pacifiers Not provided at the hospital except for painful procedures (i.e. circumcisions),  can mask feeding problems and interfere w/ establishing breastfeeding. If want pacifier, need to bring one. [ _ ] Reviewed safe sleep Alone on back in crib, no co-sleeping, no exposure to smoking, make sure all caregivers aware of recommendations [ _ ] Postpartum family planning: she elects _ [ _ ] Circumcision: Discussed procedure, risks and benefits with patient [ _ ] Anesthesia: Discussed epidural anesthesia risks and benefits [ _ ] TOLAC: risk, benefits, and alternatives were discussed and consent was signed on _.  ASSESSMENT/PLAN _ year old G_ P_ at _ weeks & _ days by _ :  [facility]leton IUP - Continue routine prenatal care, next visit in _ weeks - The above marked checklist items were discussed today - Educational handout was provided to the patient for further education

## 2020-07-11 NOTE — Patient Instructions (Incomplete)
It was wonderful to see you today.  Please bring ALL of your medications with you to every visit.   Today we talked about:  - Next prenatal visit in one month - pap smear completed today  Thank you for choosing [facility].   Please call [phone] with any questions about today's appointment.  Please be sure to schedule follow up at the front  desk before you leave today.   Burley Saver, MD  Family Medicine

## 2020-07-13 ENCOUNTER — Ambulatory Visit (INDEPENDENT_AMBULATORY_CARE_PROVIDER_SITE_OTHER): Payer: Medicaid Other | Admitting: Family Medicine

## 2020-07-13 ENCOUNTER — Other Ambulatory Visit: Payer: Self-pay | Admitting: Family Medicine

## 2020-07-13 ENCOUNTER — Other Ambulatory Visit: Payer: Self-pay

## 2020-07-13 VITALS — BP 110/80 | HR 87 | Ht 65.87 in | Wt 152.6 lb

## 2020-07-13 DIAGNOSIS — B3731 Acute candidiasis of vulva and vagina: Secondary | ICD-10-CM

## 2020-07-13 DIAGNOSIS — Z3492 Encounter for supervision of normal pregnancy, unspecified, second trimester: Secondary | ICD-10-CM

## 2020-07-13 DIAGNOSIS — N898 Other specified noninflammatory disorders of vagina: Secondary | ICD-10-CM

## 2020-07-13 DIAGNOSIS — R81 Glycosuria: Secondary | ICD-10-CM | POA: Diagnosis not present

## 2020-07-13 DIAGNOSIS — B373 Candidiasis of vulva and vagina: Secondary | ICD-10-CM | POA: Insufficient documentation

## 2020-07-13 DIAGNOSIS — R7309 Other abnormal glucose: Secondary | ICD-10-CM

## 2020-07-13 DIAGNOSIS — Z8759 Personal history of other complications of pregnancy, childbirth and the puerperium: Secondary | ICD-10-CM

## 2020-07-13 LAB — POCT URINALYSIS DIP (MANUAL ENTRY)
Bilirubin, UA: NEGATIVE
Blood, UA: NEGATIVE
Glucose, UA: 100 mg/dL — AB
Nitrite, UA: NEGATIVE
Protein Ur, POC: NEGATIVE mg/dL
Spec Grav, UA: >=1.03 — AB (ref 1.010–1.025)
Urobilinogen, UA: 0.2 E.U./dL
pH, UA: 5.5 (ref 5.0–8.0)

## 2020-07-13 LAB — POCT UA - MICROSCOPIC ONLY

## 2020-07-13 LAB — GLUCOSE, POCT (MANUAL RESULT ENTRY): POC Glucose: 116 mg/dl — AB (ref 70–99)

## 2020-07-13 MED ORDER — CLOTRIMAZOLE 1 % VA CREA: 1.0000 | TOPICAL_CREAM | Freq: Every day | VAGINAL | 0 refills | Status: AC

## 2020-07-13 NOTE — Assessment & Plan Note (Signed)
U/A microscopy notable for glucosuria, CBG elevated at 116. Patient scheduled for lab visit on 1/21 for 1 hour glucola testing to be reviewed at next routine OB appt.

## 2020-07-13 NOTE — Progress Notes (Addendum)
    SUBJECTIVE:   CHIEF COMPLAINT / HPI: vaginal discharge/irritation  In-person interpreter was present for this encounter.   Patient reports that her vulva is red, itchy and raw that has been present for close to two weeks. She denies vaginal bleeding but reports white vaginal discharge that is of the consistency of yogurt. Patient denies presence of hematuria but reports that urine touching the vulva is uncomfortable due to the irritation of the skin. Patient requests a topical medication for her symptoms. She denies any back pain, fevers or chills.   PERTINENT  PMH / PSH:  G2P1001 at [redacted]w[redacted]d  OBJECTIVE:   BP 110/80   Pulse 87   Ht 5' 5.87" (1.673 m)   Wt 152 lb 9.6 oz (69.2 kg)   SpO2 100%   BMI 24.73 kg/m   General: female appearing stated age in no acute distress Cardio: Normal S1 and S2, no S3 or S4. Rhythm is regular. No murmurs or rubs.  Bilateral radial pulses palpable Pulm: normal WOB Abdomen: Bowel sounds normal. Abdomen soft and non-tender. Gravid uterus  Extremities: No peripheral edema.   ASSESSMENT/PLAN:   Vaginal candidiasis Patient declines pelvic exam and wet prep today. Based on clinical hx and results of yeast present on urine microscopy, symptoms are consistent with vaginal candidiasis. Other diagnoses include UTI but low suspicion due to lack of UTI related symptoms.  Will collect urine culture, U/A completed today  Will treat with topical clotrimazole for 7 nights  Patient to follow up on 1/28 for routine OB care    Elevated serum glucose with glucosuria U/A microscopy notable for glucosuria, CBG elevated at 116. Patient scheduled for lab visit on 1/21 for 1 hour glucola testing to be reviewed at next routine OB appt.     Ronnald Ramp, MD [facility]

## 2020-07-13 NOTE — Patient Instructions (Addendum)
Based on your symptoms, I will be treating you for vaginal candidiasis or yeast infection with a cream called Clotrimazole. Please apply at bedtime for 7 nights.   Your urine showed glucose which is concerning for gestational diabetes so I will check your blood sugar level today and determine if you need another medication.

## 2020-07-13 NOTE — Assessment & Plan Note (Signed)
Patient declines pelvic exam and wet prep today. Based on clinical hx and results of yeast present on urine microscopy, symptoms are consistent with vaginal candidiasis. Other diagnoses include UTI but low suspicion due to lack of UTI related symptoms.  Will collect urine culture, U/A completed today  Will treat with topical clotrimazole for 7 nights  Patient to follow up on 1/28 for routine OB care

## 2020-07-15 ENCOUNTER — Other Ambulatory Visit: Payer: Medicaid Other

## 2020-07-15 ENCOUNTER — Ambulatory Visit: Payer: Medicaid Other | Admitting: Family Medicine

## 2020-07-15 ENCOUNTER — Other Ambulatory Visit: Payer: Self-pay

## 2020-07-15 DIAGNOSIS — R81 Glycosuria: Secondary | ICD-10-CM

## 2020-07-15 DIAGNOSIS — N823 Fistula of vagina to large intestine: Secondary | ICD-10-CM

## 2020-07-15 DIAGNOSIS — Z8759 Personal history of other complications of pregnancy, childbirth and the puerperium: Secondary | ICD-10-CM

## 2020-07-15 DIAGNOSIS — R7309 Other abnormal glucose: Secondary | ICD-10-CM

## 2020-07-15 DIAGNOSIS — Z3492 Encounter for supervision of normal pregnancy, unspecified, second trimester: Secondary | ICD-10-CM

## 2020-07-15 LAB — CULTURE, OB URINE

## 2020-07-15 LAB — URINE CULTURE, OB REFLEX

## 2020-07-16 LAB — GLUCOSE, 1 HOUR GESTATIONAL: Gestational Diabetes Screen: 109 mg/dL (ref 65–139)

## 2020-07-18 NOTE — Progress Notes (Unsigned)
  [facility] Prenatal Visit  Kelsey Mcdaniel is a 25 y.o. G2P1001 at [redacted]w[redacted]d here for routine follow up. She is dated by midtrimester ultrasound.  She reports backache. She reports fetal movement. She denies vaginal bleeding, contractions, or loss of fluid. See flow sheet for details.  Vitals:   07/22/20 0858  BP: 96/60  Pulse: 94      A/P: Pregnancy at [redacted]w[redacted]d.  Doing well.   1. Routine prenatal care:  Marland Kitchen Dating reviewed, dating tab is correct . Fetal heart tones Appropriate . Fundal height within expected range.  . Infant feeding choice: Breastfeeding . Contraception choice: Undecided , is not thinking she would want any medications. . Female fetus  . The patient does not have a history of Cesarean delivery and no referral to Center for [facility] is indicated . Influenza vaccine- previously given 04/07/2020 . Tdap was given today. . CBC, RPR, and HIV were obtained today.  1 hr GTT already collected and normal.  . Rh status was reviewed and patient does need Rhogam.  Rhogam was not given today.  . Pregnancy education regarding benefits of breastfeeding, contraception, fetal growth, expected weight gain, and safe infant sleep were discussed.  . Preterm labor and fetal movement precautions reviewed.  2. Pregnancy issues include the following and were addressed as appropriate today:  ASSESSMENT & PLAN  Singleton IUP Late to Prenatal Care - dating by 18wk sono, consistent with recent anatomy sono on 06/10/20 at 22.0 WGA - will need pap smear postpartum - has received J&J initially, then received Moderna booster last month, will bring vaccine record to next appt so we can document - repeat CBC, RPR, HIV today - Tdap given today 07/22/20  History of Gestational HTN- started aspirin 81 mg daily, baseline labs collected at initial refugee appointment, blood pressure appropriate today  History of recto-vaginal fistula- per OB, after extensive discussion patient  elects to continue [facility] and labor vaginally - plan for growth sono at 36 WGA- per OB if LGA would reconsider trial of vaginal labor/delivery   Eosinophilia- mild, noted on CBC w/diff, presumptive parasite treatment not recommended in pregnant women per CDC, plan to repeat post-partum as patient is asymptomatic at this time, could treat with albenzdazole and ivermectin postpartum if eosinophilia persists.  Back Pain, Sacral Dysfunction - exercises given today, PRN apap sent to pharmacy GERD- Pepcid sent to pharmacy  Resolved Yeast infection Glucosuria- noted last week on urinalysis, 1 hr GTT wnl, on repeat urinalysis today resolved  . Problem list and pregnancy box updated: Yes.   Follow up in 2 weeks.

## 2020-07-22 ENCOUNTER — Other Ambulatory Visit: Payer: Self-pay

## 2020-07-22 ENCOUNTER — Encounter: Payer: Self-pay | Admitting: Family Medicine

## 2020-07-22 ENCOUNTER — Ambulatory Visit (INDEPENDENT_AMBULATORY_CARE_PROVIDER_SITE_OTHER): Payer: Medicaid Other | Admitting: Family Medicine

## 2020-07-22 VITALS — BP 96/60 | HR 94 | Wt 155.0 lb

## 2020-07-22 DIAGNOSIS — Z23 Encounter for immunization: Secondary | ICD-10-CM

## 2020-07-22 DIAGNOSIS — Z3403 Encounter for supervision of normal first pregnancy, third trimester: Secondary | ICD-10-CM

## 2020-07-22 DIAGNOSIS — R81 Glycosuria: Secondary | ICD-10-CM

## 2020-07-22 DIAGNOSIS — K219 Gastro-esophageal reflux disease without esophagitis: Secondary | ICD-10-CM

## 2020-07-22 DIAGNOSIS — Z8759 Personal history of other complications of pregnancy, childbirth and the puerperium: Secondary | ICD-10-CM

## 2020-07-22 DIAGNOSIS — M545 Low back pain, unspecified: Secondary | ICD-10-CM

## 2020-07-22 LAB — POCT URINALYSIS DIP (MANUAL ENTRY)
Bilirubin, UA: NEGATIVE
Blood, UA: NEGATIVE
Glucose, UA: NEGATIVE mg/dL
Ketones, POC UA: NEGATIVE mg/dL
Leukocytes, UA: NEGATIVE
Nitrite, UA: NEGATIVE
Protein Ur, POC: NEGATIVE mg/dL
Spec Grav, UA: 1.025 (ref 1.010–1.025)
Urobilinogen, UA: 0.2 E.U./dL
pH, UA: 7 (ref 5.0–8.0)

## 2020-07-22 MED ORDER — ACETAMINOPHEN 500 MG PO TABS: 500.0000 mg | ORAL_TABLET | Freq: Four times a day (QID) | ORAL | 1 refills | Status: DC | PRN

## 2020-07-22 MED ORDER — FAMOTIDINE 20 MG PO TABS: 20.0000 mg | ORAL_TABLET | Freq: Every day | ORAL | 1 refills | Status: DC

## 2020-07-22 NOTE — Patient Instructions (Addendum)
It was wonderful to see you today.  Please bring ALL of your medications with you to every visit.   Today we talked about:  - Tetanus vaccine given today - lab work checked today, will call with results  - exercises given for low back pain, PRN acetaminophen sent to pharmacy - Pepcid sent for acid reflux - F/u appt in 2 weeks with any provider if I am not available  Thank you for choosing [facility].   Please call [phone] with any questions about today's appointment.  Please be sure to schedule follow up at the front  desk before you leave today.   Burley Saver, MD  Family Medicine

## 2020-07-23 LAB — HIV ANTIBODY (ROUTINE TESTING W REFLEX): HIV Screen 4th Generation wRfx: NONREACTIVE

## 2020-07-23 LAB — CBC
Hematocrit: 28.9 % — ABNORMAL LOW (ref 34.0–46.6)
Hemoglobin: 9.5 g/dL — ABNORMAL LOW (ref 11.1–15.9)
MCH: 28.4 pg (ref 26.6–33.0)
MCHC: 32.9 g/dL (ref 31.5–35.7)
MCV: 87 fL (ref 79–97)
Platelets: 292 10*3/uL (ref 150–450)
RBC: 3.34 x10E6/uL — ABNORMAL LOW (ref 3.77–5.28)
RDW: 12.7 % (ref 11.7–15.4)
WBC: 12.6 10*3/uL — ABNORMAL HIGH (ref 3.4–10.8)

## 2020-07-23 LAB — RPR: RPR Ser Ql: NONREACTIVE

## 2020-07-26 ENCOUNTER — Telehealth: Payer: Self-pay | Admitting: Family Medicine

## 2020-07-26 DIAGNOSIS — O99019 Anemia complicating pregnancy, unspecified trimester: Secondary | ICD-10-CM

## 2020-07-26 DIAGNOSIS — D509 Iron deficiency anemia, unspecified: Secondary | ICD-10-CM

## 2020-07-26 MED ORDER — FERROUS SULFATE 325 (65 FE) MG PO TBEC
325.0000 mg | DELAYED_RELEASE_TABLET | ORAL | 1 refills | Status: DC
Start: 2020-07-26 — End: 2021-03-28

## 2020-07-26 NOTE — Telephone Encounter (Signed)
Called with Pashto phone interpretor, patient's husband picked up but stated he was at work and then hung up, unable to discuss.  Called patient's sponsor Mac Stroupe per patient's request at last appt, let him know PO ferrous sulfate every other day sent to pharmacy to start taking and he states should be able to pick this up and bring to her.  Yehuda Savannah MD

## 2020-08-01 ENCOUNTER — Other Ambulatory Visit: Payer: Self-pay

## 2020-08-01 ENCOUNTER — Ambulatory Visit (INDEPENDENT_AMBULATORY_CARE_PROVIDER_SITE_OTHER): Payer: Medicaid Other | Admitting: Family Medicine

## 2020-08-01 VITALS — BP 100/60 | HR 94 | Wt 156.0 lb

## 2020-08-01 DIAGNOSIS — O99019 Anemia complicating pregnancy, unspecified trimester: Secondary | ICD-10-CM | POA: Insufficient documentation

## 2020-08-01 DIAGNOSIS — Z3403 Encounter for supervision of normal first pregnancy, third trimester: Secondary | ICD-10-CM

## 2020-08-01 NOTE — Progress Notes (Signed)
  [facility] Prenatal Visit  Kelsey Mcdaniel is a 25 y.o. G2P1001 at [redacted]w[redacted]d here for routine follow up. She is dated by [place] when arrived in [place] at 18 weeks.  She reports no complaints. She reports fetal movement. She denies vaginal bleeding, contractions, or loss of fluid. See flow sheet for details.  In-person interpreter present for entirety of encounter  Vitals:   08/01/20 1102  BP: 100/60  Pulse: 94     A/P: Pregnancy at [redacted]w[redacted]d.  Doing well.   1. Routine prenatal care:  Marland Kitchen Dating reviewed, dating tab is correct . Fetal heart tones Appropriate . Fundal height within expected range.  . Infant feeding choice: Breastfeeding . Contraception choice: Undecided  . Infant circumcision desired not applicable  . The patient does not have a history of Cesarean delivery and no referral to Center for [facility] is indicated . Influenza vaccine previously administered.   . Tdap was not given today.  Given at last visit. 1 hour glucola, CBC, RPR, and HIV were not obtained today.  Previously obtained  . Rh status was reviewed and patient does not need Rhogam.  Rhogam was not given today.  . Pregnancy medical home and PHQ-9 forms were not done today and reviewed.   . Childbirth and education classes were not offered. . Pregnancy education regarding benefits of breastfeeding, contraception, fetal growth, expected weight gain, and safe infant sleep were discussed.  . Preterm labor and fetal movement precautions reviewed.  2. Pregnancy issues include the following and were addressed as appropriate today:  . Late to prenatal care  Dated by 18-week sonogram consistent with anatomy scan on 06/10/2020  Needs postpartum Pap smear  Has had Moderna booster last month, again did not bring her vaccine record  with her  Anemia of pregnancy  Started on ferrous sulfate every other day by Dr. Miquel Dunn after last visit on 1/28, tolerating well  Repeat in 4 weeks, 6 weeks from initiation of  treatment  History of gestational hypertension  Has been on aspirin 81 mg daily, BP appropriate today  History of rectovaginal fistula  Per OB, patient would like to labor vaginally and continue prenatal care with [facility]  We will need a growth sonogram at 36 weeks per OB, if LGA would reconsider trial of vaginal labor/delivery  Eosinophilia  Mild and noted on CBC, plan to repeat postpartum as she is asymptomatic and presumptive treatment is not recommended in pregnant women per CDC  . Problem list and pregnancy box updated: Yes.   Patient scheduled in [facility] during third trimester on 3/3.  Follow up 2 weeks.

## 2020-08-01 NOTE — Patient Instructions (Signed)
Third Trimester of Pregnancy  The third trimester of pregnancy is from week 28 through week 40. This is also called months 7 through 9. This trimester is when your unborn baby (fetus) is growing very fast. At the end of the ninth month, the unborn baby is about 20 inches long. It weighs about 6-10 pounds. Body changes during your third trimester Your body continues to go through many changes during this time. The changes vary and generally return to normal after the baby is born. Physical changes  Your weight will continue to increase. You may gain 25-35 pounds (11-16 kg) by the end of the pregnancy. If you are underweight, you may gain 28-40 lb (about 13-18 kg). If you are overweight, you may gain 15-25 lb (about 7-11 kg).  You may start to get stretch marks on your hips, belly (abdomen), and breasts.  Your breasts will continue to grow and may hurt. A yellow fluid (colostrum) may leak from your breasts. This is the first milk you are making for your baby.  You may have changes in your hair.  Your belly button may stick out.  You may have more swelling in your hands, face, or ankles. Health changes  You may have heartburn.  You may have trouble pooping (constipation).  You may get hemorrhoids. These are swollen veins in the butt that can itch or get painful.  You may have swollen veins (varicose veins) in your legs.  You may have more body aches in the pelvis, back, or thighs.  You may have more tingling or numbness in your hands, arms, and legs. The skin on your belly may also feel numb.  You may feel short of breath as your womb (uterus) gets bigger. Other changes  You may pee (urinate) more often.  You may have more problems sleeping.  You may notice the unborn baby "dropping," or moving lower in your belly.  You may have more discharge coming from your vagina.  Your joints may feel loose, and you may have pain around your pelvic bone. Follow these instructions at  home: Medicines  Take over-the-counter and prescription medicines only as told by your doctor. Some medicines are not safe during pregnancy.  Take a prenatal vitamin that contains at least 600 micrograms (mcg) of folic acid. Eating and drinking  Eat healthy meals that include: ? Fresh fruits and vegetables. ? Whole grains. ? Good sources of protein, such as meat, eggs, or tofu. ? Low-fat dairy products.  Avoid raw meat and unpasteurized juice, milk, and cheese. These carry germs that can harm you and your baby.  Eat 4 or 5 small meals rather than 3 large meals a day.  You may need to take these actions to prevent or treat trouble pooping: ? Drink enough fluids to keep your pee (urine) pale yellow. ? Eat foods that are high in fiber. These include beans, whole grains, and fresh fruits and vegetables. ? Limit foods that are high in fat and sugar. These include fried or sweet foods. Activity  Exercise only as told by your doctor. Stop exercising if you start to have cramps in your womb.  Avoid heavy lifting.  Do not exercise if it is too hot or too humid, or if you are in a place of great height (high altitude).  If you choose to, you may have sex unless your doctor tells you not to. Relieving pain and discomfort  Take breaks often, and rest with your legs raised (elevated) if you have   leg cramps or low back pain.  Take warm water baths (sitz baths) to soothe pain or discomfort caused by hemorrhoids. Use hemorrhoid cream if your doctor approves.  Wear a good support bra if your breasts are tender.  If you develop bulging, swollen veins in your legs: ? Wear support hose as told by your doctor. ? Raise your feet for 15 minutes, 3-4 times a day. ? Limit salt in your food. Safety  Talk to your doctor before traveling far distances.  Do not use hot tubs, steam rooms, or saunas.  Wear your seat belt at all times when you are in a car.  Talk with your doctor if someone is  hurting you or yelling at you a lot. Preparing for your baby's arrival To prepare for the arrival of your baby:  Take prenatal classes.  Visit the hospital and tour the maternity area.  Buy a rear-facing car seat. Learn how to install it in your car.  Prepare the baby's room. Take out all pillows and stuffed animals from the baby's crib. General instructions  Avoid cat litter boxes and soil used by cats. These carry germs that can cause harm to the baby and can cause a loss of your baby by miscarriage or stillbirth.  Do not douche or use tampons. Do not use scented sanitary pads.  Do not smoke or use any products that contain nicotine or tobacco. If you need help quitting, ask your doctor.  Do not drink alcohol.  Do not use herbal medicines, illegal drugs, or medicines that were not approved by your doctor. Chemicals in these products can affect your baby.  Keep all follow-up visits. This is important. Where to find more information  American Pregnancy Association: americanpregnancy.org  American College of Obstetricians and Gynecologists: www.acog.org  Office on Women's Health: womenshealth.gov/pregnancy Contact a doctor if:  You have a fever.  You have mild cramps or pressure in your lower belly.  You have a nagging pain in your belly area.  You vomit, or you have watery poop (diarrhea).  You have bad-smelling fluid coming from your vagina.  You have pain when you pee, or your pee smells bad.  You have a headache that does not go away when you take medicine.  You have changes in how you see, or you see spots in front of your eyes. Get help right away if:  Your water breaks.  You have regular contractions that are less than 5 minutes apart.  You are spotting or bleeding from your vagina.  You have very bad belly cramps or pain.  You have trouble breathing.  You have chest pain.  You faint.  You have not felt the baby move for the amount of time told by  your doctor.  You have new or increased pain, swelling, or redness in an arm or leg. Summary  The third trimester is from week 28 through week 40 (months 7 through 9). This is the time when your unborn baby is growing very fast.  During this time, your discomfort may increase as you gain weight and as your baby grows.  Get ready for your baby to arrive by taking prenatal classes, buying a rear-facing car seat, and preparing the baby's room.  Get help right away if you are bleeding from your vagina, you have chest pain and trouble breathing, or you have not felt the baby move for the amount of time told by your doctor. This information is not intended to replace advice given   to you by your health care provider. Make sure you discuss any questions you have with your health care provider. Document Revised: 11/18/2019 Document Reviewed: 09/24/2019 Elsevier Patient Education  2021 Elsevier Inc.   Dental list         Updated 11.20.18 These dentists all accept Medicaid.  The list is a courtesy and for your convenience. Estos dentistas aceptan Medicaid.  La lista es para su [place] y es una cortesa.     [facility]     [phone] [address]  [address] [place] [address] Se habla espaol From 62 to 36 years old Parent may go with child only for cleaning Vinson Moselle DDS     [phone] Milus Banister, DDS (Spanish speaking) [address] [place]  [address] Se habla espaol From 53 to 64 years old Parent may go with child   Marolyn Hammock DMD    [phone] [address] [place] [address] Se habla espaol [place] spoken From 64 years old Parent may go with child [facility]     [phone] [address] [place] [address] Se habla espaol From 48 to 22 years old Parent may NOT go with child  Winfield Rast DDS  [phone] [facility]      [address]  [place] [address] Se habla  espaol [place] spoken (preferred to bring translator) From teeth coming in to 13 years old Parent may go with child  [facility]     [phone] [address] [place]. [place] [address] Requires certification. Call for information. Requiere certificacin. Llame para informacin. Algunos dias se habla espaol  From birth to 20 years Parent possibly goes with child   Bradd Canary DDS     [phone] [address]  [address] [place] [address] Se habla espaol From 18 months to 18 years  Parent may go with child  [facility] DDS     Garlon Hatchet DDS  [phone] [address] [place] [address] Se habla espaol From 81 year old Parent may go with child   Melynda Ripple DDS    [phone] [address] [place] [address] Se habla espaol  From 18 months to 63 years old Parent may go with child Dorian Pod DDS    [phone] [address] [place] [address] Se habla espaol From 16 to 6 years old Parent may go with child  [facility]    [phone] [address] [place] [address] No se Wayne Sever From birth [facility]  [phone] [address] [place] [address] Se habla espanol Interpretation for other languages Special needs children welcome  Geryl Councilman, DDS PA     [phone] [address]  [place], [place] [address] From 25 years old   Special needs children welcome  [facility]   [phone] Dr. Orlean Patten [address] [place], [place] [address] Se habla espaol From birth to 12 years Special needs children welcome   [facility] [phone] [address] [place], [place] [address]   [facility] [phone] [address] [place], [place] [address]

## 2020-08-15 NOTE — Progress Notes (Signed)
  [facility] Prenatal Visit  Kelsey Mcdaniel is a 25 y.o. G2P1001 at [redacted]w[redacted]d here for routine follow up. She is dated by early ultrasound.  She reports backache that primarily affects her at night.  She reports fetal movement. She denies vaginal bleeding, contractions, or loss of fluid.  See flow sheet for details.  Vitals:   08/16/20 0920  BP: (!) 98/54  Pulse: 99    PE General: female appearing stated age in no acute distress Cardio: Normal S1 and S2, no S3 or S4. Rhythm is regular. systolic murmurs,no rubs.  Bilateral radial pulses palpable Pulm: Clear to auscultation bilaterally, no crackles, wheezing, or diminished breath sounds. Normal respiratory effort, stable on RA Abdomen: Gravid uterus, Bowel sounds normal. Abdomen soft and non-tender.  Extremities: No peripheral edema. Warm/ well perfused.     A/P: Pregnancy at [redacted]w[redacted]d.  Doing well.   1. Routine prenatal care:  Marland Kitchen Dating reviewed, dating tab is correct . Fetal heart tones: Appropriate . Fundal height: within expected range.  . The patient does not have a history of HSV and valacyclovir is not indicated at this time.  . The patient does not have a history of Cesarean delivery and no referral to [facility] is indicated . Infant feeding choice: Breastfeeding . Contraception choice: Undecided  . Infant circumcision desired not applicable . Influenza vaccine previously administered.   . Tdap was not given today. 1/28 given  . COVID vaccination was discussed and patient declines at this time.  . Childbirth and education classes were offered. . Pregnancy education regarding benefits of breastfeeding, contraception, fetal growth, expected weight gain, and safe infant sleep were discussed.  . Preterm labor and fetal movement precautions reviewed.       -156 from 141, WNL for weight gain, current BMI 25  2. Pregnancy issues include the following and were addressed as appropriate today: . 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     Problem list and pregnancy box updated: Yes.   Scheduled for [facility] clinic in third trimester on 3/3.

## 2020-08-16 ENCOUNTER — Other Ambulatory Visit: Payer: Self-pay

## 2020-08-16 ENCOUNTER — Encounter: Payer: Self-pay | Admitting: Family Medicine

## 2020-08-16 ENCOUNTER — Ambulatory Visit (INDEPENDENT_AMBULATORY_CARE_PROVIDER_SITE_OTHER): Payer: Medicaid Other | Admitting: Family Medicine

## 2020-08-16 VITALS — BP 98/54 | HR 99 | Wt 156.2 lb

## 2020-08-16 DIAGNOSIS — Z3483 Encounter for supervision of other normal pregnancy, third trimester: Secondary | ICD-10-CM

## 2020-08-16 DIAGNOSIS — O99013 Anemia complicating pregnancy, third trimester: Secondary | ICD-10-CM

## 2020-08-16 DIAGNOSIS — N823 Fistula of vagina to large intestine: Secondary | ICD-10-CM

## 2020-08-16 NOTE — Patient Instructions (Addendum)
Please use the attached stretches to help with your back pain. I expect this to improve in the next few weeks.    [email or web address]">  Third Trimester of Pregnancy  The third trimester of pregnancy is from week 28 through week 40. This is months 7 through 9. The third trimester is a time when the unborn baby (fetus) is growing rapidly. At the end of the ninth month, the fetus is about 20 inches long and weighs 6-10 pounds. Body changes during your third trimester During the third trimester, your body will continue to go through many changes. The changes vary and generally return to normal after your baby is born. Physical changes  Your weight will continue to increase. You can expect to gain 25-35 pounds (11-16 kg) by the end of the pregnancy if you begin pregnancy at a normal weight. If you are underweight, you can expect to gain 28-40 lb (about 13-18 kg), and if you are overweight, you can expect to gain 15-25 lb (about 7-11 kg).  You may begin to get stretch marks on your hips, abdomen, and breasts.  Your breasts will continue to grow and may hurt. A yellow fluid (colostrum) may leak from your breasts. This is the first milk you are producing for your baby.  You may have changes in your hair. These can include thickening of your hair, rapid growth, and changes in texture. Some people also have hair loss during or after pregnancy, or hair that feels dry or thin.  Your belly button may stick out.  You may notice more swelling in your hands, face, or ankles. Health changes  You may have heartburn.  You may have constipation.  You may develop hemorrhoids.  You may develop swollen, bulging veins (varicose veins) in your legs.  You may have increased body aches in the pelvis, back, or thighs. This is due to weight gain and increased hormones that are relaxing your joints.  You may have increased tingling or numbness in your hands,  arms, and legs. The skin on your abdomen may also feel numb.  You may feel short of breath because of your expanding uterus. Other changes  You may urinate more often because the fetus is moving lower into your pelvis and pressing on your bladder.  You may have more problems sleeping. This may be caused by the size of your abdomen, an increased need to urinate, and an increase in your body's metabolism.  You may notice the fetus "dropping," or moving lower in your abdomen (lightening).  You may have increased vaginal discharge.  You may notice that you have pain around your pelvic bone as your uterus distends. Follow these instructions at home: Medicines  Follow your health care provider's instructions regarding medicine use. Specific medicines may be either safe or unsafe to take during pregnancy. Do not take any medicines unless approved by your health care provider.  Take a prenatal vitamin that contains at least 600 micrograms (mcg) of folic acid. Eating and drinking  Eat a healthy diet that includes fresh fruits and vegetables, whole grains, good sources of protein such as meat, eggs, or tofu, and low-fat dairy products.  Avoid raw meat and unpasteurized juice, milk, and cheese. These carry germs that can harm you and your baby.  Eat 4 or 5 small meals rather than 3 large meals a day.  You may need to take these actions to prevent or treat constipation: ? Drink enough fluid to keep your urine pale yellow. ? Eat foods  that are high in fiber, such as beans, whole grains, and fresh fruits and vegetables. ? Limit foods that are high in fat and processed sugars, such as fried or sweet foods. Activity  Exercise only as directed by your health care provider. Most people can continue their usual exercise routine during pregnancy. Try to exercise for 30 minutes at least 5 days a week. Stop exercising if you experience contractions in the uterus.  Stop exercising if you develop pain or  cramping in the lower abdomen or lower back.  Avoid heavy lifting.  Do not exercise if it is very hot or humid or if you are at a high altitude.  If you choose to, you may continue to have sex unless your health care provider tells you not to. Relieving pain and discomfort  Take frequent breaks and rest with your legs raised (elevated) if you have leg cramps or low back pain.  Take warm sitz baths to soothe any pain or discomfort caused by hemorrhoids. Use hemorrhoid cream if your health care provider approves.  Wear a supportive bra to prevent discomfort from breast tenderness.  If you develop varicose veins: ? Wear support hose as told by your health care provider. ? Elevate your feet for 15 minutes, 3-4 times a day. ? Limit salt in your diet. Safety  Talk to your health care provider before traveling far distances.  Do not use hot tubs, steam rooms, or saunas.  Wear your seat belt at all times when driving or riding in a car.  Talk with your health care provider if someone is verbally or physically abusive to you. Preparing for birth To prepare for the arrival of your baby:  Take prenatal classes to understand, practice, and ask questions about labor and delivery.  Visit the hospital and tour the maternity area.  Purchase a rear-facing car seat and make sure you know how to install it in your car.  Prepare the baby's room or sleeping area. Make sure to remove all pillows and stuffed animals from the baby's crib to prevent suffocation. General instructions  Avoid cat litter boxes and soil used by cats. These carry germs that can cause birth defects in the baby. If you have a cat, ask someone to clean the litter box for you.  Do not douche or use tampons. Do not use scented sanitary pads.  Do not use any products that contain nicotine or tobacco, such as cigarettes, e-cigarettes, and chewing tobacco. If you need help quitting, ask your health care provider.  Do not use  any herbal remedies, illegal drugs, or medicines that were not prescribed to you. Chemicals in these products can harm your baby.  Do not drink alcohol.  You will have more frequent prenatal exams during the third trimester. During a routine prenatal visit, your health care provider will do a physical exam, perform tests, and discuss your overall health. Keep all follow-up visits. This is important. Where to find more information  American Pregnancy Association: americanpregnancy.org  [employer] of Obstetricians and Gynecologists: https://www.todd-brady.net/  Office on [employer] Health: MightyReward.co.nz Contact a health care provider if you have:  A fever.  Mild pelvic cramps, pelvic pressure, or nagging pain in your abdominal area or lower back.  Vomiting or diarrhea.  Bad-smelling vaginal discharge or foul-smelling urine.  Pain when you urinate.  A headache that does not go away when you take medicine.  Visual changes or see spots in front of your eyes. Get help right away if:  Your water breaks.  You have regular contractions less than 5 minutes apart.  You have spotting or bleeding from your vagina.  You have severe abdominal pain.  You have difficulty breathing.  You have chest pain.  You have fainting spells.  You have not felt your baby move for the time period told by your health care provider.  You have new or increased pain, swelling, or redness in an arm or leg. Summary  The third trimester of pregnancy is from week 28 through week 40 (months 7 through 9).  You may have more problems sleeping. This can be caused by the size of your abdomen, an increased need to urinate, and an increase in your body's metabolism.  You will have more frequent prenatal exams during the third trimester. Keep all follow-up visits. This is important. This information is not intended to replace advice given to you by your health care provider. Make sure  you discuss any questions you have with your health care provider. Document Revised: 11/18/2019 Document Reviewed: 09/24/2019 Elsevier Patient Education  2021 [employer].

## 2020-08-19 ENCOUNTER — Other Ambulatory Visit: Payer: Self-pay | Admitting: Obstetrics and Gynecology

## 2020-08-19 ENCOUNTER — Ambulatory Visit
Admission: RE | Admit: 2020-08-19 | Discharge: 2020-08-19 | Disposition: A | Discharge status: Home / Self Care | Payer: Medicaid Other | Source: Ambulatory Visit | Attending: Obstetrics and Gynecology | Admitting: Obstetrics and Gynecology

## 2020-08-19 ENCOUNTER — Other Ambulatory Visit: Payer: Self-pay

## 2020-08-19 DIAGNOSIS — Z111 Encounter for screening for respiratory tuberculosis: Secondary | ICD-10-CM

## 2020-08-25 ENCOUNTER — Ambulatory Visit (INDEPENDENT_AMBULATORY_CARE_PROVIDER_SITE_OTHER): Payer: Medicaid Other | Admitting: Family Medicine

## 2020-08-25 ENCOUNTER — Other Ambulatory Visit: Payer: Self-pay

## 2020-08-25 VITALS — BP 102/62 | HR 87 | Wt 158.0 lb

## 2020-08-25 DIAGNOSIS — Z3483 Encounter for supervision of other normal pregnancy, third trimester: Secondary | ICD-10-CM

## 2020-08-25 NOTE — Patient Instructions (Signed)
It was great seeing you today!  I am glad your pregnancy is going well thus far!  Go to the [facility]U at [facility] if:  You begin to have strong, frequent contractions  Your water breaks.  Sometimes it is a big gush of fluid, sometimes it is just a trickle that keeps getting your underwear wet or running down your legs  You have vaginal bleeding.  It is normal to have a small amount of spotting if your cervix was checked.   You do not feel your baby moving like normal.  If you do not, get something to eat and drink and lay down and focus on feeling your baby move.   If your baby is still not moving like normal, you should go to MAU.   Please follow up in 2 weeks for your next OB appointment, if anything arises between now and then, please don't hesitate to contact our office.   Thank you for allowing [place] to be a part of your medical care!  Thank you, Dr. Robyne Peers

## 2020-08-25 NOTE — Progress Notes (Addendum)
  [facility] Prenatal Visit  Kelsey Mcdaniel is a 25 y.o. G2P1001 at [redacted]w[redacted]d here for routine follow up. She is dated by midtrimester ultrasound.  She reports backache and occasional lower abdominal pain.  She reports fetal movement. She denies vaginal bleeding, contractions, or loss of fluid.  See flow sheet for details.  Vitals:   08/25/20 1132  BP: 102/62  Pulse: 87     A/P: Pregnancy at [redacted]w[redacted]d.  Doing well.   Routine prenatal care:  Dating reviewed, dating tab is correct Fetal heart tones: Appropriate Fundal height: within expected range.  The patient does not have a history of HSV and valacyclovir is not indicated at this time.  The patient does not have a history of Cesarean delivery and no referral to [facility] is indicated Infant feeding choice: Breastfeeding Contraception choice: Condoms Infant circumcision desired not applicable Influenza vaccine previously administered.   Tdap was not given today but was previously administered on 07/22/2020. COVID vaccination (both doses along with the booster) already administered at prior visit.  Childbirth and education classes were offered. Pregnancy education regarding benefits of breastfeeding, contraception, fetal growth, expected weight gain, and safe infant sleep were discussed.  Preterm labor and fetal movement precautions reviewed.   2. Pregnancy issues include the following and were addressed as appropriate today:  PHQ-9 score of 0 reviewed.  Problem list and pregnancy box updated: Yes.   In-person interpretation utilized throughout the entirety of the encounter.  Follow up 2 weeks, patient declined PAP. Consider PAP at next follow up visit.

## 2020-08-26 LAB — CBC WITH DIFFERENTIAL/PLATELET
Basophils Absolute: 0.1 10*3/uL (ref 0.0–0.2)
Basos: 0 %
EOS (ABSOLUTE): 0.3 10*3/uL (ref 0.0–0.4)
Eos: 2 %
Hematocrit: 30.5 % — ABNORMAL LOW (ref 34.0–46.6)
Hemoglobin: 10.1 g/dL — ABNORMAL LOW (ref 11.1–15.9)
Immature Grans (Abs): 0.1 10*3/uL (ref 0.0–0.1)
Immature Granulocytes: 1 %
Lymphocytes Absolute: 2.8 10*3/uL (ref 0.7–3.1)
Lymphs: 21 %
MCH: 28.6 pg (ref 26.6–33.0)
MCHC: 33.1 g/dL (ref 31.5–35.7)
MCV: 86 fL (ref 79–97)
Monocytes Absolute: 1.1 10*3/uL — ABNORMAL HIGH (ref 0.1–0.9)
Monocytes: 8 %
Neutrophils Absolute: 9.2 10*3/uL — ABNORMAL HIGH (ref 1.4–7.0)
Neutrophils: 68 %
Platelets: 260 10*3/uL (ref 150–450)
RBC: 3.53 x10E6/uL — ABNORMAL LOW (ref 3.77–5.28)
RDW: 14.2 % (ref 11.7–15.4)
WBC: 13.5 10*3/uL — ABNORMAL HIGH (ref 3.4–10.8)

## 2020-08-26 LAB — FERRITIN: Ferritin: 12 ng/mL — ABNORMAL LOW (ref 15–150)

## 2020-09-09 ENCOUNTER — Other Ambulatory Visit: Payer: Self-pay | Admitting: Family Medicine

## 2020-09-09 NOTE — Progress Notes (Signed)
[facility] Visit  Kelsey Mcdaniel is a 25 y.o. G2P1001 at [redacted]w[redacted]d (via 18wk ultrasound) who presents to [facility] for routine follow up. Prenatal course, history, notes, ultrasounds, and laboratory results reviewed. Some constant lower pelvic pain, worse with movement.   Denies cramping/ctx, fluid leaking, vaginal bleeding, or decreased fetal movement. Taking PNV, iron every other day, and aspirin.    Primary Prenatal Care Provider: Dr Miquel Dunn  Postpartum Plans: - delivery planning: vaginal, discussed risk of recurrence of fistula with high risk OB and aware of risks but desires vaginal delivery, will plan for growth [place] next week 09/19/2020 to assess EFW - circumcision: n/a girl - feeding: breast - pediatrician: [facility] - contraception: condoms  Objective:  FHR: 144 Uterine size: 36 cm  GU: Normal appearance of labia majora and minora, without lesions. Vagina tissue pink, moist, without lesions or abrasions. Cervix normal appearance, friable, mucoid discharge noted in vaginal vault, no leakage of fluid from os or pooling, small amount of bleeding notes with spatula used for pap smear. Abd: soft, no guarding or rebound, gravid, some tenderness over pubic symphysis MSK: mild TTP of bilateral lumbar paraspinal muscles, no midline tenderness  Sabino Niemann CMA present as chaperone for pelvic exam.  Assessment & Plan  1. Routine prenatal care: - GBS & GC/Chlamydia collected today - pap smear collected today - Vertex by bedside ultrasound  2. 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 Iron def anemia in pregnancy- on 08/25/20 Hgb 10.1, ferritin 12. Continue iron every other day. Consider rechecking at next visit.  4. History of gHTN in previous pregnancy- on  aspirin, blood pressure appropriate today, asymptomatic. Discussed pre-E signs/symptoms in detail today.  Next prenatal visit in 1 weeks . Labor & fetal movement precautions discussed.  Burley Saver, MD [facility]

## 2020-09-16 ENCOUNTER — Other Ambulatory Visit: Payer: Self-pay

## 2020-09-16 ENCOUNTER — Ambulatory Visit (INDEPENDENT_AMBULATORY_CARE_PROVIDER_SITE_OTHER): Payer: Medicaid Other | Admitting: Family Medicine

## 2020-09-16 ENCOUNTER — Other Ambulatory Visit (HOSPITAL_COMMUNITY)
Admission: RE | Admit: 2020-09-16 | Discharge: 2020-09-16 | Disposition: A | Discharge status: Home / Self Care | Payer: Medicaid Other | Source: Ambulatory Visit | Attending: Family Medicine | Admitting: Family Medicine

## 2020-09-16 VITALS — BP 96/58 | HR 100 | Wt 162.0 lb

## 2020-09-16 DIAGNOSIS — Z3403 Encounter for supervision of normal first pregnancy, third trimester: Secondary | ICD-10-CM | POA: Insufficient documentation

## 2020-09-16 NOTE — Patient Instructions (Addendum)
It was wonderful to see you today.  Please bring ALL of your medications with you to every visit.   Today we talked about:  - You will have weekly appointments now - We checked GBS and GC/chlamydia today, and done your pap smear - We did an ultrasound to confirm your baby is head down! - We have scheduled an ultrasound to look at the size of your baby for next Monday 09/19/2020 - Pregnancy belt would be helpful for your lower abdominal pain  We will see you back in one week.   Thank you for choosing [facility].   Please call [phone] with any questions about today's appointment.  Please be sure to schedule follow up at the front  desk before you leave today.   Burley Saver, MD  Family Medicine   Pregnancy Related Return Precautions The follow are signs/symptoms that are abnormal in pregnancy and may require further evaluation by a physician: Go to the MAU at [facility] at [facility] if:  You have cramping/contractions that do not go away with drinking water, especially if they are lasting 30 seconds to 1.5 minutes, coming and going every 5-10 minutes for an hour or more, or are getting stronger and you cannot walk or talk while having a contraction/cramp.  Your water breaks.  Sometimes it is a big gush of fluid, sometimes it is just a trickle that keeps getting your underwear wet or running down your legs  You have vaginal bleeding.     You do not feel your baby moving like normal.  If you do not, get something to eat and drink (something cold or something with sugar like peanut butter or juice) and lay down and focus on feeling your baby move. If your baby is still not moving like normal, you should go to MAU. You should feel your baby move 6 times in one hour, or 10 times in two hours.  You have a persistent headache that does not go away with 1 g of Tylenol, vision changes, chest pain, difficulty breathing, severe pain in your right upper  abdomen, worsening leg swelling- these can all be signs of high blood pressure in pregnancy and need to be evaluated by a provider immediately  These are all concerning in pregnancy and if you have any of these I recommend you call your PCP and present to the Maternity Admissions Unit (map below) for further evaluation.  For any pregnancy-related emergencies, please go to the Maternity Admissions Unit in the [facility]'s [facility]'s Center at [facility]. You will use hospital Entrance C.

## 2020-09-19 ENCOUNTER — Ambulatory Visit: Payer: Medicaid Other | Attending: Family Medicine

## 2020-09-19 ENCOUNTER — Ambulatory Visit: Payer: Medicaid Other | Admitting: *Deleted

## 2020-09-19 ENCOUNTER — Encounter: Payer: Self-pay | Admitting: *Deleted

## 2020-09-19 ENCOUNTER — Other Ambulatory Visit: Payer: Self-pay

## 2020-09-19 DIAGNOSIS — D649 Anemia, unspecified: Secondary | ICD-10-CM

## 2020-09-19 DIAGNOSIS — Z3A36 36 weeks gestation of pregnancy: Secondary | ICD-10-CM | POA: Diagnosis not present

## 2020-09-19 DIAGNOSIS — Z362 Encounter for other antenatal screening follow-up: Secondary | ICD-10-CM | POA: Diagnosis not present

## 2020-09-19 DIAGNOSIS — O99013 Anemia complicating pregnancy, third trimester: Secondary | ICD-10-CM

## 2020-09-19 DIAGNOSIS — N823 Fistula of vagina to large intestine: Secondary | ICD-10-CM | POA: Diagnosis not present

## 2020-09-19 DIAGNOSIS — Z8759 Personal history of other complications of pregnancy, childbirth and the puerperium: Secondary | ICD-10-CM

## 2020-09-20 LAB — CULTURE, BETA STREP (GROUP B ONLY): Strep Gp B Culture: NEGATIVE

## 2020-09-20 LAB — CYTOLOGY - PAP
Chlamydia: NEGATIVE
Comment: NEGATIVE
Comment: NEGATIVE
Comment: NORMAL
Diagnosis: NEGATIVE
Neisseria Gonorrhea: NEGATIVE
Trichomonas: NEGATIVE

## 2020-09-23 ENCOUNTER — Encounter: Payer: Self-pay | Admitting: Family Medicine

## 2020-09-23 ENCOUNTER — Other Ambulatory Visit: Payer: Self-pay

## 2020-09-23 ENCOUNTER — Ambulatory Visit (INDEPENDENT_AMBULATORY_CARE_PROVIDER_SITE_OTHER): Payer: Medicaid Other | Admitting: Family Medicine

## 2020-09-23 VITALS — BP 100/56 | HR 86 | Wt 162.5 lb

## 2020-09-23 DIAGNOSIS — Z3483 Encounter for supervision of other normal pregnancy, third trimester: Secondary | ICD-10-CM

## 2020-09-23 DIAGNOSIS — O99019 Anemia complicating pregnancy, unspecified trimester: Secondary | ICD-10-CM

## 2020-09-23 MED ORDER — BLOOD PRESSURE MONITOR AUTOMAT DEVI: 1.0000 [IU] | Freq: Every day | 0 refills | Status: DC

## 2020-09-23 NOTE — Patient Instructions (Signed)
Pregnancy Related Return Precautions The follow are signs/symptoms that are abnormal in pregnancy and may require further evaluation by a physician: Go to the MAU at [facility] at [place] if: You have cramping/contractions that do not go away with drinking water, especially if they are lasting 30 seconds to 1.5 minutes, coming and going every 5-10 minutes for an hour or more, or are getting stronger and you cannot walk or talk while having a contraction/cramp. Your water breaks.  Sometimes it is a big gush of fluid, sometimes it is just a trickle that keeps getting your underwear wet or running down your legs You have vaginal bleeding.    You do not feel your baby moving like normal.  If you do not, get something to eat and drink (something cold or something with sugar like peanut butter or juice) and lay down and focus on feeling your baby move. If your baby is still not moving like normal, you should go to MAU. You should feel your baby move 6 times in one hour, or 10 times in two hours. You have a persistent headache that does not go away with 1 g of Tylenol, vision changes, chest pain, difficulty breathing, severe pain in your right upper abdomen, worsening leg swelling- these can all be signs of high blood pressure in pregnancy and need to be evaluated by a provider immediately  These are all concerning in pregnancy and if you have any of these I recommend you call your PCP and present to the Maternity Admissions Unit (map below) for further evaluation.  For any pregnancy-related emergencies, please go to the Maternity Admissions Unit in the [facility] at [facility]. You will use hospital Entrance C.    

## 2020-09-23 NOTE — Progress Notes (Signed)
  [facility] Prenatal Visit  Kelsey Mcdaniel is a 25 y.o. G2P1001 at [redacted]w[redacted]d here for routine follow up. She is dated by 18 week ultrasound.  She reports backache and leg cramps that wake her up at night.  Has been having cramps for about 3 months. She reports getting medicine from over the counter but is unsure what she has been taking. Reports that it is not working. She also reports swelling in her hands first thing in the morning that improves throughout the day.   She reports fetal movement. She denies vaginal bleeding, contractions, or loss of fluid. See flow sheet for details.  She denies headaches, changes in vision, abdominal pain. She has been taking her ASA daily.    Vitals:   09/23/20 1535  BP: (!) 100/56  Pulse: 86  Mild edema in her lower extremities, R > L.   A/P: Pregnancy at [redacted]w[redacted]d.  Doing well.   1. Routine prenatal care  . Dating reviewed, dating tab is correct . Fetal heart tones Appropriate . Fundal height within expected range.  . Fetal position confirmed Vertex using Ultrasound .  Marland Kitchen GBS not collected today due to previously collected. .  . Repeat GC/CT not collected today due to previously collected .  Marland Kitchen The patient does not have a history of HSV and valacyclovir is not indicated at this time.  . Infant feeding choice: Breastfeeding . Contraception choice: condoms  . Infant circumcision desired not applicable . Influenza vaccine previously administered.   . Tdap previously administered between 27-36 weeks  . COVID vaccination: previously administered .  Marland Kitchen Pregnancy education regarding preterm labor, fetal movement,  benefits of breastfeeding, contraception, fetal growth, expected weight gain, and safe infant sleep were discussed.    2. Pregnancy issues include the following and were addressed as appropriate today:  Back and hip pain - Conservative management. discussed stretches and using a yoga ball to help with pain.   HTN: BP wnl today. She does  not have a cuff at home. Sent one to the pharamcy for her. MAU precautions provided.   Anemia during Pregnancy: repeat CBC today   Lower extremity edema: no concern for DVT, likely due to pregnancy. Talked about elevation and provided return precuations.   Ramadan starts tomorrow. Patient is asking for recommendations and reports that she is not required to fast. Discussed importance of staying hydrated.   . Problem list and pregnancy box updated: Yes.   Follow up 1 week.   Melene Plan, M.D.  9:43 PM 09/23/2020

## 2020-09-24 LAB — CBC
Hematocrit: 32.7 % — ABNORMAL LOW (ref 34.0–46.6)
Hemoglobin: 10.6 g/dL — ABNORMAL LOW (ref 11.1–15.9)
MCH: 28.1 pg (ref 26.6–33.0)
MCHC: 32.4 g/dL (ref 31.5–35.7)
MCV: 87 fL (ref 79–97)
Platelets: 283 10*3/uL (ref 150–450)
RBC: 3.77 x10E6/uL (ref 3.77–5.28)
RDW: 15.2 % (ref 11.7–15.4)
WBC: 15.3 10*3/uL — ABNORMAL HIGH (ref 3.4–10.8)

## 2020-09-29 NOTE — Progress Notes (Signed)
[facility] Visit  Kelsey Mcdaniel is a 25 y.o. G2P1001 at [redacted]w[redacted]d (via 73wk sono) who presents to for routine follow up. Prenatal course, history, notes, ultrasounds, and laboratory results reviewed.  Denies cramping/ctx, fluid leaking, vaginal bleeding, or decreased fetal movement. Taking PNV.    Primary Prenatal Care Provider: Dr Miquel Dunn  Postpartum Plans: - delivery planning: vaginal, discussed risk of recurrence of fistula with high risk OB and aware of risks but desires vaginal delivery, growth ultrasound EFW 61%ile at 36.3 WGA - circumcision: n/a girl - feeding: breast - pediatrician: [facility] - contraception: condoms  FHR: 144 Uterine size: 37 cm TWG: 23 lbs (2 lbs since last week)  Assessment & Plan  1. Routine prenatal care: -  Vertex by Leopolds, GBS negative, f/u in 1 week  2. 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 Right lower extremity swelling- more noted in the foot, worsening since last week, but she does note right calf pain only and occasional shortness of breath and sometimes chest pain. Calf diameters are both 35 cm, symmetric, and most of the swelling seems to be in her right foot, with the feet R>L swelling. But with worsening since last week, asymmetry, and pain, need to rule out DVT in pregnancy. Appt scheduled for 1500 today at [facility] [address] location, date and time given to patient today. If positive for DVT, will send to MAU.  4. Iron def anemia in pregnancy- Hgb 10.6 on 09/23/20. Continue iron every other day.  5. History of gHTN in previous pregnancy- on aspirin, blood pressure appropriate today, asymptomatic. Discussed pre-E signs/symptoms in detail today.  Next prenatal visit in 1 weeks . Labor & fetal movement precautions discussed. Offer cervical exam at next appt and  schedule for induction of labor at 41 WGA.  11/23/20, MD [facility] Faculty

## 2020-09-30 ENCOUNTER — Ambulatory Visit (HOSPITAL_COMMUNITY)
Admission: RE | Admit: 2020-09-30 | Discharge: 2020-09-30 | Disposition: A | Discharge status: Home / Self Care | Payer: Medicaid Other | Source: Ambulatory Visit | Attending: Family Medicine | Admitting: Family Medicine

## 2020-09-30 ENCOUNTER — Ambulatory Visit (INDEPENDENT_AMBULATORY_CARE_PROVIDER_SITE_OTHER): Payer: Medicaid Other | Admitting: Family Medicine

## 2020-09-30 ENCOUNTER — Other Ambulatory Visit: Payer: Self-pay

## 2020-09-30 VITALS — BP 105/60 | HR 82 | Wt 164.6 lb

## 2020-09-30 DIAGNOSIS — M7989 Other specified soft tissue disorders: Secondary | ICD-10-CM | POA: Insufficient documentation

## 2020-09-30 DIAGNOSIS — Z3483 Encounter for supervision of other normal pregnancy, third trimester: Secondary | ICD-10-CM

## 2020-09-30 NOTE — Patient Instructions (Addendum)
It was wonderful to see you today.  Please bring ALL of your medications with you to every visit.   Today we talked about:  - Ultrasound appointment: at [address] - will call if results abnormal - Follow-up appointment scheduled in 1 week  Thank you for choosing [facility].   Please call [phone] with any questions about today's appointment.  Please be sure to schedule follow up at the front  desk before you leave today.   Burley Saver, MD  Family Medicine   Pregnancy Related Return Precautions The follow are signs/symptoms that are abnormal in pregnancy and may require further evaluation by a physician: Go to the MAU at [facility] at [facility] if:  You have cramping/contractions that do not go away with drinking water, especially if they are lasting 30 seconds to 1.5 minutes, coming and going every 5-10 minutes for an hour or more, or are getting stronger and you cannot walk or talk while having a contraction/cramp.  Your water breaks.  Sometimes it is a big gush of fluid, sometimes it is just a trickle that keeps getting your underwear wet or running down your legs  You have vaginal bleeding.     You do not feel your baby moving like normal.  If you do not, get something to eat and drink (something cold or something with sugar like peanut butter or juice) and lay down and focus on feeling your baby move. If your baby is still not moving like normal, you should go to MAU. You should feel your baby move 6 times in one hour, or 10 times in two hours.  You have a persistent headache that does not go away with 1 g of Tylenol, vision changes, chest pain, difficulty breathing, severe pain in your right upper abdomen, worsening leg swelling- these can all be signs of high blood pressure in pregnancy and need to be evaluated by a provider immediately  These are all concerning in pregnancy and if you have any of these  I recommend you call your PCP and present to the Maternity Admissions Unit (map below) for further evaluation.  For any pregnancy-related emergencies, please go to the Maternity Admissions Unit in the Women's & Children's Center at [facility]. You will use hospital Entrance C.

## 2020-10-03 ENCOUNTER — Telehealth: Payer: Self-pay

## 2020-10-03 NOTE — Telephone Encounter (Signed)
Noted, have added to her pregnancy chart to request this interpretor. Not sure if we are able to accommodate this in the hospital.

## 2020-10-03 NOTE — Telephone Encounter (Signed)
Kelsey Mcdaniel patients advocate calls nurse line requesting a note be added to her chart to contact a specific interpreter when she goes into labor. Kelsey Mcdaniel I am not sure how that works, however will forward to PCP.   Interpreter: Kelsey Mcdaniel

## 2020-10-05 NOTE — Progress Notes (Signed)
  [facility] Prenatal Visit  Kelsey Mcdaniel is a 25 y.o. G2P1001 at [redacted]w[redacted]d here for routine follow up. She is dated by 18 week ultrasound.  She reports feels some cramping pain. Happened 6 times yesterday, lasting 3-4 seconds. Spaced out by hours.. She reports fetal movement. She denies vaginal bleeding, contractions, or loss of fluid. See flow sheet for details.  Vitals:   10/06/20 0942  BP: 102/64  Pulse: 83    A/P: Pregnancy at [redacted]w[redacted]d.  Doing well.   1. Routine prenatal care:  Marland Kitchen Dating reviewed, dating tab is correct . Fetal heart tones Appropriate . Fundal height within expected range.  . Fetal position confirmed Vertex using Leopold's .  Marland Kitchen Infant feeding choice: Breastfeeding . Contraception choice: Undecided ; plans to use condoms . Infant circumcision desired not applicable . Influenza vaccine previously administered.   . Tdap previously administered between 27-36 weeks  . GBS and gc/chlamydia testing results were reviewed today.   . Pregnancy education regarding labor, fetal movement,  benefits of breastfeeding, contraception, and safe infant sleep were discussed.  . Labor and fetal movement precautions reviewed. . Induction of labor discussed. She will need to be scheduled for induction at next visit, for approximately 41 weeks. BPP scheduled for 4/20.   2. Pregnancy issues include the following and were addressed as appropriate today:  .     Hx rectovaginal fistula repair after previous delivery of breech presentation. After OB consult and discussion of risk plan is to labor vaginally, [place] 09/19/20 @36 .3 WGA with EFW 61%ile, 3012g  Hx Anemia: taking iron supplements every other day  Hx of gHTN in previous pregnancy: taking ASA daily. Has BP cuff that she uses at home, reports normal BP's. BP normotensive here.   Declined cervical check today. Can offer again at next visit. .     Problem list and pregnancy box updated: Yes.   Follow up 1 week.

## 2020-10-06 ENCOUNTER — Ambulatory Visit (INDEPENDENT_AMBULATORY_CARE_PROVIDER_SITE_OTHER): Payer: Medicaid Other | Admitting: Family Medicine

## 2020-10-06 ENCOUNTER — Other Ambulatory Visit: Payer: Self-pay

## 2020-10-06 VITALS — BP 102/64 | HR 83 | Wt 164.0 lb

## 2020-10-06 DIAGNOSIS — Z3403 Encounter for supervision of normal first pregnancy, third trimester: Secondary | ICD-10-CM | POA: Diagnosis not present

## 2020-10-06 NOTE — Patient Instructions (Addendum)
It was wonderful to meet you today.  Please bring ALL of your medications with you to every visit.   Today we talked about:  -Your baby is growing well and sounds good.  -You have an appointment for an ultrasound on 4/20 at 1:15 at Center for [facility] at [facility].  -You have another appointment with me 4/22 at 10:45AM.   -Please go to the Maternal Assessment Unit at the hospital if you feel decreased fetal movement, are having regular contractions, have vaginal bleeding and/or your water breaks.   Thank you for choosing [facility].   Please call [phone] with any questions about today's appointment.  Please be sure to schedule follow up at the front  desk before you leave today.   Sabino Dick, DO PGY-1 Family Medicine

## 2020-10-11 ENCOUNTER — Other Ambulatory Visit: Payer: Self-pay

## 2020-10-11 ENCOUNTER — Inpatient Hospital Stay (HOSPITAL_COMMUNITY)
Admission: AD | Admit: 2020-10-11 | Discharge: 2020-10-13 | DRG: 768 | Disposition: A | Discharge status: Home / Self Care | Payer: Medicaid Other | Attending: Obstetrics and Gynecology | Admitting: Obstetrics and Gynecology

## 2020-10-11 ENCOUNTER — Encounter (HOSPITAL_COMMUNITY): Payer: Self-pay | Admitting: Obstetrics and Gynecology

## 2020-10-11 DIAGNOSIS — Z20822 Contact with and (suspected) exposure to covid-19: Secondary | ICD-10-CM | POA: Diagnosis present

## 2020-10-11 DIAGNOSIS — O26893 Other specified pregnancy related conditions, third trimester: Secondary | ICD-10-CM | POA: Diagnosis present

## 2020-10-11 DIAGNOSIS — O9902 Anemia complicating childbirth: Secondary | ICD-10-CM | POA: Diagnosis present

## 2020-10-11 DIAGNOSIS — Z3A39 39 weeks gestation of pregnancy: Secondary | ICD-10-CM

## 2020-10-11 DIAGNOSIS — D509 Iron deficiency anemia, unspecified: Secondary | ICD-10-CM | POA: Diagnosis present

## 2020-10-11 DIAGNOSIS — Z8759 Personal history of other complications of pregnancy, childbirth and the puerperium: Secondary | ICD-10-CM

## 2020-10-11 DIAGNOSIS — D649 Anemia, unspecified: Secondary | ICD-10-CM | POA: Diagnosis not present

## 2020-10-11 LAB — RESP PANEL BY RT-PCR (FLU A&B, COVID) ARPGX2
Influenza A by PCR: NEGATIVE
Influenza B by PCR: NEGATIVE
SARS Coronavirus 2 by RT PCR: NEGATIVE

## 2020-10-11 LAB — CBC
HCT: 34 % — ABNORMAL LOW (ref 36.0–46.0)
Hemoglobin: 11.1 g/dL — ABNORMAL LOW (ref 12.0–15.0)
MCH: 28.3 pg (ref 26.0–34.0)
MCHC: 32.6 g/dL (ref 30.0–36.0)
MCV: 86.7 fL (ref 80.0–100.0)
Platelets: 256 10*3/uL (ref 150–400)
RBC: 3.92 MIL/uL (ref 3.87–5.11)
RDW: 15.6 % — ABNORMAL HIGH (ref 11.5–15.5)
WBC: 15.3 10*3/uL — ABNORMAL HIGH (ref 4.0–10.5)
nRBC: 0 % (ref 0.0–0.2)

## 2020-10-11 LAB — TYPE AND SCREEN
ABO/RH(D): A POS
Antibody Screen: NEGATIVE

## 2020-10-11 MED ORDER — ACETAMINOPHEN 325 MG PO TABS: 650.0000 mg | ORAL_TABLET | ORAL | Status: DC | PRN

## 2020-10-11 MED ORDER — TRANEXAMIC ACID-NACL 1000-0.7 MG/100ML-% IV SOLN
INTRAVENOUS | Status: AC
  Filled 2020-10-11: qty 100

## 2020-10-11 MED ORDER — CEFAZOLIN SODIUM-DEXTROSE 2-4 GM/100ML-% IV SOLN: 2.0000 g | Freq: Once | INTRAVENOUS | Status: DC

## 2020-10-11 MED ORDER — LIDOCAINE HCL (PF) 1 % IJ SOLN
30.0000 mL | INTRAMUSCULAR | Status: AC | PRN
  Administered 2020-10-11: 30 mL via SUBCUTANEOUS
  Filled 2020-10-11: qty 30

## 2020-10-11 MED ORDER — OXYTOCIN-SODIUM CHLORIDE 30-0.9 UT/500ML-% IV SOLN
2.5000 [IU]/h | INTRAVENOUS | Status: DC
  Administered 2020-10-11: 2.5 [IU]/h via INTRAVENOUS
  Filled 2020-10-11: qty 500

## 2020-10-11 MED ORDER — LACTATED RINGERS IV SOLN: 500.0000 mL | INTRAVENOUS | Status: DC | PRN

## 2020-10-11 MED ORDER — ONDANSETRON HCL 4 MG/2ML IJ SOLN: 4.0000 mg | Freq: Four times a day (QID) | INTRAMUSCULAR | Status: DC | PRN

## 2020-10-11 MED ORDER — SOD CITRATE-CITRIC ACID 500-334 MG/5ML PO SOLN: 30.0000 mL | ORAL | Status: DC | PRN

## 2020-10-11 MED ORDER — LACTATED RINGERS IV SOLN: INTRAVENOUS | Status: DC

## 2020-10-11 MED ORDER — OXYTOCIN BOLUS FROM INFUSION
333.0000 mL | Freq: Once | INTRAVENOUS | Status: AC
  Administered 2020-10-11: 333 mL via INTRAVENOUS

## 2020-10-11 MED ORDER — FENTANYL CITRATE (PF) 100 MCG/2ML IJ SOLN
50.0000 ug | INTRAMUSCULAR | Status: DC | PRN
  Administered 2020-10-11 (×3): 100 ug via INTRAVENOUS
  Filled 2020-10-11 (×3): qty 2

## 2020-10-11 NOTE — Progress Notes (Addendum)
Patient ID: Kelsey Mcdaniel, female   DOB: 08-30-1995, 25 y.o.   MRN: [id number] Patient with h/o RV fistula, s/p repair x 4 in [place] following previous breech vaginal birth. Following delivery evidence of 4th degree without a large 2nd degree. Anal sphincter is separated and difficult to find.  4th degree, approximately 0.5 cm in length repaired with 4-0 Vicryl on an SH in running normal fashion in 2 layers. 3rd degree 3c repaired with 2-0 Vicryl on CT1 in 2 figure of eights. 2nd degree repaired in usual fashion. Rectal exam post repair intact.  Lidocaine used for anesthesia.

## 2020-10-11 NOTE — Discharge Summary (Signed)
Postpartum Discharge Summary  Date of Service updated     Patient Name: Kelsey Mcdaniel DOB: September 23, 1995 MRN: [id number]  Date of admission: 10/11/2020 Delivery date:10/11/2020  Delivering provider: Wells Guiles R  Date of discharge: 10/13/2020  Admitting diagnosis: Normal labor [O80, Z37.9] Intrauterine pregnancy: [redacted]w[redacted]d     Secondary diagnosis:  Active Problems:   Normal labor  Additional problems: 4th degree lac    Discharge diagnosis: Term Pregnancy Delivered                                              Post partum procedures:None Augmentation: AROM Complications: None  Hospital course: Onset of Labor With Vaginal Delivery      25 y.o. yo G2P1001 at [redacted]w[redacted]d was admitted in Active Labor on 10/11/2020. Patient had an uncomplicated labor course as follows:  Membrane Rupture Time/Date: 8:48 PM ,10/11/2020   Delivery Method:Vaginal, Spontaneous  Episiotomy: None  Lacerations:  4th degree  Patient had an uncomplicated postpartum course.  She is ambulating, tolerating a regular diet, passing flatus, and urinating well. Patient is discharged home in stable condition on 10/13/20.  Newborn Data: Birth date:10/11/2020  Birth time:10:07 PM  Gender:Female  Living status:Living  Apgars:9 ,9  Weight:3311 g   Magnesium Sulfate received: No BMZ received: No Rhophylac:N/A MMR:N/A T-DaP:Given prenatally Flu: given prenatally Transfusion:No  Physical exam  Vitals:   10/12/20 1100 10/12/20 1635 10/12/20 2138 10/13/20 0633  BP: (!) 96/59 103/63 113/63 106/67  Pulse: 74 72 65 63  Resp: $Remo'16 16 18 16  'GIojG$ Temp: 98 F (36.7 C) 98.5 F (36.9 C) 99.2 F (37.3 C) 98 F (36.7 C)  TempSrc:  Oral Oral Oral  SpO2:   100%   Weight:       General: alert, cooperative and no distress Lochia: appropriate Uterine Fundus: firm Incision: N/A DVT Evaluation: No evidence of DVT seen on physical exam. Labs: Lab Results  Component Value Date   WBC 15.3 (H) 10/11/2020   HGB 11.1 (L) 10/11/2020    HCT 34.0 (L) 10/11/2020   MCV 86.7 10/11/2020   PLT 256 10/11/2020   CMP Latest Ref Rng & Units 06/02/2020  Glucose 65 - 99 mg/dL 81  BUN 6 - 20 mg/dL 8  Creatinine 0.57 - 1.00 mg/dL 0.52(L)  Sodium 134 - 144 mmol/L 135  Potassium 3.5 - 5.2 mmol/L 4.2  Chloride 96 - 106 mmol/L 102  CO2 20 - 29 mmol/L 21  Calcium 8.7 - 10.2 mg/dL 8.6(L)  Total Protein 6.0 - 8.5 g/dL 7.1  Total Bilirubin 0.0 - 1.2 mg/dL 0.4  Alkaline Phos 44 - 121 IU/L 41(L)  AST 0 - 40 IU/L 14  ALT 0 - 32 IU/L 11   Edinburgh Score: Edinburgh Postnatal Depression Scale Screening Tool 10/13/2020  I have been able to laugh and see the funny side of things. 0  I have looked forward with enjoyment to things. 0  I have blamed myself unnecessarily when things went wrong. 0  I have been anxious or worried for no good reason. 0  I have felt scared or panicky for no good reason. 0  Things have been getting on top of me. 0  I have been so unhappy that I have had difficulty sleeping. 0  I have felt sad or miserable. 0  I have been so unhappy that I have been crying. 0  The thought of harming myself has occurred to me. 0  Edinburgh Postnatal Depression Scale Total 0     After visit meds:  Allergies as of 10/13/2020   No Known Allergies     Medication List    STOP taking these medications   aspirin EC 81 MG tablet     TAKE these medications   acetaminophen 500 MG tablet Commonly known as: TYLENOL Take 1 tablet (500 mg total) by mouth every 6 (six) hours as needed.   bisacodyl 10 MG suppository Commonly known as: DULCOLAX Place 1 suppository (10 mg total) rectally daily as needed for moderate constipation.   Blood Pressure Monitor Automat Devi 1 Units by Does not apply route daily.   docusate sodium 100 MG capsule Commonly known as: COLACE Take 1 capsule (100 mg total) by mouth in the morning, at noon, and at bedtime.   docusate sodium 100 MG capsule Commonly known as: COLACE Take 1 capsule (100 mg  total) by mouth 2 (two) times daily.   famotidine 20 MG tablet Commonly known as: Pepcid Take 1 tablet (20 mg total) by mouth daily.   ferrous sulfate 325 (65 FE) MG EC tablet Take 1 tablet (325 mg total) by mouth every other day.   measles, mumps & rubella vaccine injection Commonly known as: MMR Inject 0.5 mLs into the skin once for 1 dose.   polyethylene glycol 17 g packet Commonly known as: MIRALAX / GLYCOLAX Take 17 g by mouth daily.   polyethylene glycol 17 g packet Commonly known as: MIRALAX / GLYCOLAX Take 17 g by mouth daily. Start taking on: October 14, 2020   prenatal multivitamin Tabs tablet Take 1 tablet by mouth daily at 12 noon.        Discharge home in stable condition Infant Feeding: Breast Infant Disposition:home with mother Discharge instruction: per After Visit Summary and Postpartum booklet. Activity: Advance as tolerated. Pelvic rest for 6 weeks.  Diet: routine diet Future Appointments: No future appointments. Follow up Visit:  -referral for Pelvic floor pt ordered, brief description of pelvic floor therapy given to husband and patient  -BP is normal; not enrolled in [employer] program  -Detailed bowel regimen instructions given. Explained the need to purchase medicines OTC if her insurance does not pay for them. Discussed that she needs to avoid straining, needs to increase water and fiber, decrease sugars and carbs to promote BM.   - patient should be seen at Center for [facility] care  (any site) for 4th degree inspection with female only MD who does pelvic surgery in case patient needs surgery. Per. Dr. Loni Muse, this should be in the next 10-14 days.   Detailed instructions given for patient to have follow up perineal check on 5/4 at [facility]. There were no other appt available at [facility] with female only MD in the next ten days. Then, patient is to keep appt with Dr. Kennon Rounds on 5/25 for PP visit at [address]. [facility] [place] coordinated care for this  patient to have these separate follow up appts.   -Emphasized to patient the importance of these appointments, discussed where to go and that they are different locations but she needs to keep both of these appts.    10/13/2020 Starr Lake, CNM

## 2020-10-11 NOTE — H&P (Signed)
OBSTETRIC ADMISSION HISTORY AND PHYSICAL  Kelsey Mcdaniel is a 25 y.o. female G2P1001 with IUP at [redacted]w[redacted]d by 18wk [place] presenting for labor. Reports contractions started last night and have gotten more painful. Also reports noting some gushes of blood earlier at home. In MAU, noted to have some dark red vaginal bleeding. She reports +FMs, No LOF,  no blurry vision, headaches or peripheral edema, and RUQ pain.  She plans on breast feeding. She declines birth control. She received her prenatal care at [facility]   Dating: By 18 wk [place] --->  Estimated Date of Delivery: 10/14/20  Sono:    @[redacted]w[redacted]d , CWD, normal anatomy, cephalic presentation, 3012g, EFW   Prenatal History/Complications:   -hx of breech extraction with last delivery, complicated by rectovaginal fistula, s/p repair. No incontinence issues currently. Had consult with OB.  -h/o getational HTN in prior pregnancy, no issues this pregnancy  -Iron deficiency anemia - on PO iron    Past Medical History: Past Medical History:  Diagnosis Date  . Pregnancy induced hypertension     Past Surgical History: Past Surgical History:  Procedure Laterality Date  . RECTOVAGINAL FISTULA CLOSURE  2021    Obstetrical History: OB History    Gravida  2   Para  1   Term  1   Preterm  0   AB  0   Living  1     SAB      IAB      Ectopic      Multiple      Live Births  1           Social History Social History   Socioeconomic History  . Marital status: Married    Spouse name: Not on file  . Number of children: Not on file  . Years of education: Not on file  . Highest education level: Not on file  Occupational History  . Not on file  Tobacco Use  . Smoking status: Never Smoker  . Smokeless tobacco: Never Used  Vaping Use  . Vaping Use: Never used  Substance and Sexual Activity  . Alcohol use: Never  . Drug use: Never  . Sexual activity: Yes  Other Topics Concern  . Not on file  Social History Narrative  . Not on file    Social Determinants of Health   Financial Resource Strain: Not on file  Food Insecurity: Food Insecurity Present  . Worried About 2022 in the Last Year: Sometimes true  . Ran Out of Food in the Last Year: Sometimes true  Transportation Needs: Unmet Transportation Needs  . Lack of Transportation (Medical): Yes  . Lack of Transportation (Non-Medical): Yes  Physical Activity: Not on file  Stress: Not on file  Social Connections: Not on file    Family History: Family History  Problem Relation Age of Onset  . Heart disease Father     Allergies: No Known Allergies  Medications Prior to Admission  Medication Sig Dispense Refill Last Dose  . aspirin EC 81 MG tablet Take 1 tablet (81 mg total) by mouth daily. Swallow whole. 60 tablet 2 10/11/2020 at Unknown time  . Prenatal Vit-Fe Fumarate-FA (PRENATAL MULTIVITAMIN) TABS tablet Take 1 tablet by mouth daily at 12 noon. 60 tablet 2 10/11/2020 at Unknown time  . acetaminophen (TYLENOL) 500 MG tablet Take 1 tablet (500 mg total) by mouth every 6 (six) hours as needed. (Patient not taking: No sig reported) 60 tablet 1   . Blood  Pressure Monitoring (BLOOD PRESSURE MONITOR AUTOMAT) DEVI 1 Units by Does not apply route daily. 1 each 0   . famotidine (PEPCID) 20 MG tablet Take 1 tablet (20 mg total) by mouth daily. (Patient not taking: No sig reported) 60 tablet 1   . ferrous sulfate 325 (65 FE) MG EC tablet Take 1 tablet (325 mg total) by mouth every other day. 30 tablet 1      Review of Systems   All systems reviewed and negative except as stated in HPI  Blood pressure 122/68, pulse 92, temperature 98 F (36.7 C), temperature source Oral, resp. rate 16, weight 74.3 kg, SpO2 99 %. General appearance: alert, cooperative and appears stated age Lungs: normal effort Heart: regular rate and rhythm Abdomen: soft, non-tender; bowel sounds normal Pelvic: deferred (bloody show on pad)  Extremities: Homans sign is negative, no sign  of DVT Presentation: cephalic Fetal monitoring baseline 130, mod variability, pos accels, no decels  Uterine activity q3-4 min  Dilation: 4.5 Effacement (%): 80 Station: -2 Exam by:: Erle Crocker, RN   Prenatal labs: ABO, Rh: A/Positive/-- (12/09 1027) Antibody: Negative (12/09 1027) Rubella: 25.10 (12/09 1027) RPR: Non Reactive (01/28 1021)  HBsAg: Negative (12/09 1027)  HIV: Non Reactive (01/28 1021)  GBS: Negative/-- (03/25 1016)  1 hr Glucola passed Genetic screening  declined Anatomy US normal  Prenatal Transfer Tool  Maternal Diabetes: No Genetic Screening: Declined Maternal Ultrasounds/Referrals: Normal Fetal Ultrasounds or other Referrals:  None Maternal Substance Abuse:  No Significant Maternal Medications:  None Significant Maternal Lab Results: Group B Strep negative  No results found for this or any previous visit (from the past 24 hour(s)).  Patient Active Problem List   Diagnosis Date Noted  . Anemia during pregnancy 08/01/2020  . Vaginal candidiasis 07/13/2020  . Elevated serum glucose with glucosuria 07/13/2020  . History of gestational hypertension 06/30/2020  . Eosinophilia 06/14/2020  . Supervision of low-risk first pregnancy, third trimester 06/02/2020  . Encounter for health examination of refugee 06/02/2020  . Rectovaginal fistula 06/02/2020    Assessment/Plan:  Kelsey Mcdaniel is a 25 y.o. G2P1001 at [redacted]w[redacted]d here for labor.  #Labor: Expectant mgmt at this time.   #History of rectovaginal Fistula: s/p repair in 2020 in [place]. Patient has discussed options with both her prenatal care provider as well as with our OB group and has elected for an attempt at vaginal delivery.  #Vaginal Bleeding: stable at this time, reassuring fetal strip. Will monitor.   #Pain: Per patient request #FWB: Cat I #ID:  gbs neg #MOF: breast #MOC:declines  #Language barrier: in person interpreter  #History of gHTN: in prior pregnancy. normotensive on  admission, will monitor. #Fasting: for Ramadan. Will avoid fluids during hours of fasting per patient request.   Gita Kudo, MD  10/11/2020, 5:31 PM

## 2020-10-11 NOTE — Progress Notes (Signed)
OB Note Patient with uncomplicated SVD. Exam by Cedric Fishman and OB Fellow [place] done and they believe there is potentially laceration at the introitus to the rectal mucosa, ?involving it but they state the perineum feels intact  I spoke to the patient and she declines an exam by female provider. There are no female attendings on for tomorrow. I told her that we are unable to repair it unless an exam is done to see the full extent of the laceration. She states that she prefers it not repaired. I told her that the laceration would not heal itself and she runs the risk of having another RV fistula.  She states she understands this.  I told her I will try and see if I can find a female attending that can exam her  In person interpreter used.  Cornelia Copa MD Attending Center for [employer] (Faculty Practice) 10/11/2020 Time: 2240

## 2020-10-11 NOTE — Progress Notes (Signed)
Patient ID: Kelsey Mcdaniel, female   DOB: 1995/12/27, 25 y.o.   MRN: [id number] Kelsey Mcdaniel is a 25 y.o. G2P1001 at [redacted]w[redacted]d admitted for active labor  Subjective: requesting pain meds and uncomfortable w/ contractions  Objective: BP 112/60   Pulse 83   Temp 98.5 F (36.9 C) (Axillary)   Resp 16   Wt 74.3 kg   SpO2 99%   BMI 26.55 kg/m  No intake/output data recorded.  FHT:  FHR: 135 bpm, variability: moderate,  accelerations:  Present,  decelerations:  Absent UC:   regular, every 3-5 minutes  SVE:   Dilation: 6 Effacement (%): 80 Station: -1 Exam by:: Joellyn Haff, CNM Offered AROM, pt wants AROM small amt light MSF  Labs: Lab Results  Component Value Date   WBC 15.3 (H) 10/11/2020   HGB 11.1 (L) 10/11/2020   HCT 34.0 (L) 10/11/2020   MCV 86.7 10/11/2020   PLT 256 10/11/2020    Assessment / Plan: Spontaneous labor, progressing normally, now AROM'd, light MSF  Labor: active Fetal Wellbeing:  Category I Pain Control:  IV pain meds Pre-eclampsia: N/A I/D:  GBS neg Anticipated MOD: NSVB  Cheral Marker CNM, WHNP-BC 10/11/2020, 8:52 PM

## 2020-10-11 NOTE — Lactation Note (Signed)
Lactation Consultation Note  Patient Name: Kelsey Mcdaniel [id number] Date: 10/11/2020 Reason for consult: L&D Initial assessment Age:25 y.o. Consult was done thru Pashto interpreter:  L&D consult with 60+ minutes old infant and P2 mother. Parents, support person and interpreter present at time of consult. Congratulated them on their newborn. Assisted with latch laid back position to right breast. Discussed STS as ideal transition for infants after birth helping with temperature, blood sugar and comfort. Talked about primal reflexes such as rooting, hands to mouth, searching for the breast among others.   Colostrum is easily expressed, mother verbalized some discomfort with expression. Explained LC services availability during postpartum stay. Thanked family for their time.   Maternal Data Has patient been taught Hand Expression?: Yes Does the patient have breastfeeding experience prior to this delivery?: Yes How long did the patient breastfeed?: 24 months, mother stopped bf bc she got pregnant with this baby  Feeding Mother's Current Feeding Choice: Breast Milk  LATCH Score Latch: Repeated attempts needed to sustain latch, nipple held in mouth throughout feeding, stimulation needed to elicit sucking reflex.  Audible Swallowing: A few with stimulation  Type of Nipple: Everted at rest and after stimulation  Comfort (Breast/Nipple): Soft / non-tender  Hold (Positioning): Assistance needed to correctly position infant at breast and maintain latch.  LATCH Score: 7   Interventions Interventions: Breast feeding basics reviewed;Assisted with latch;Skin to skin;Breast massage;Hand express;Education  Discharge WIC Program:  (unknown, mother is a refugee and just relocated to this area)  Consult Status Consult Status: Follow-up Date: 10/12/20 Follow-up type: In-patient    Nihaal Friesen A Higuera Ancidey 10/11/2020, 11:17 PM

## 2020-10-11 NOTE — MAU Note (Signed)
Kelsey Mcdaniel is a 25 y.o. at [redacted]w[redacted]d here in MAU reporting: since last night has been having lower back and abdominal pain. Today these pains have gotten closer and more painful. Having the pains about every 7 minutes. States she felt a gush of bleeding when she was using the bathroom and so she put on a pad but has not checked for more bleeding. No LOF. +FM  Onset of complaint: last night  Pain score: 7/10  Vitals:   10/11/20 1538  BP: 135/75  Pulse: (!) 102  Resp: 16  Temp: 98 F (36.7 C)  SpO2: 99%     FHT: deferred, pt wearing a dress and reporting + FM  Lab orders placed from triage: none

## 2020-10-12 ENCOUNTER — Other Ambulatory Visit: Payer: Medicaid Other

## 2020-10-12 ENCOUNTER — Encounter (HOSPITAL_COMMUNITY): Payer: Self-pay | Admitting: Obstetrics and Gynecology

## 2020-10-12 DIAGNOSIS — O139 Gestational [pregnancy-induced] hypertension without significant proteinuria, unspecified trimester: Secondary | ICD-10-CM

## 2020-10-12 DIAGNOSIS — O09299 Supervision of pregnancy with other poor reproductive or obstetric history, unspecified trimester: Secondary | ICD-10-CM

## 2020-10-12 LAB — RPR: RPR Ser Ql: NONREACTIVE

## 2020-10-12 MED ORDER — TETANUS-DIPHTH-ACELL PERTUSSIS 5-2.5-18.5 LF-MCG/0.5 IM SUSY
0.5000 mL | PREFILLED_SYRINGE | Freq: Once | INTRAMUSCULAR | Status: DC
Start: 2020-10-12 — End: 2020-10-13

## 2020-10-12 MED ORDER — IBUPROFEN 600 MG PO TABS
600.0000 mg | ORAL_TABLET | Freq: Four times a day (QID) | ORAL | Status: DC
  Administered 2020-10-12 – 2020-10-13 (×6): 600 mg via ORAL
  Filled 2020-10-12 (×6): qty 1

## 2020-10-12 MED ORDER — MEASLES, MUMPS & RUBELLA VAC IJ SOLR: 0.5000 mL | Freq: Once | INTRAMUSCULAR | Status: DC

## 2020-10-12 MED ORDER — ACETAMINOPHEN 325 MG PO TABS
650.0000 mg | ORAL_TABLET | ORAL | Status: DC | PRN
  Administered 2020-10-12 – 2020-10-13 (×4): 650 mg via ORAL
  Filled 2020-10-12 (×4): qty 2

## 2020-10-12 MED ORDER — SIMETHICONE 80 MG PO CHEW: 80.0000 mg | CHEWABLE_TABLET | ORAL | Status: DC | PRN

## 2020-10-12 MED ORDER — COCONUT OIL OIL: 1.0000 "application " | TOPICAL_OIL | Status: DC | PRN

## 2020-10-12 MED ORDER — BISACODYL 10 MG RE SUPP: 10.0000 mg | Freq: Every day | RECTAL | Status: DC | PRN

## 2020-10-12 MED ORDER — POLYETHYLENE GLYCOL 3350 17 G PO PACK
17.0000 g | PACK | Freq: Every day | ORAL | Status: DC
  Administered 2020-10-12 – 2020-10-13 (×2): 17 g via ORAL
  Filled 2020-10-12 (×2): qty 1

## 2020-10-12 MED ORDER — ONDANSETRON HCL 4 MG PO TABS: 4.0000 mg | ORAL_TABLET | ORAL | Status: DC | PRN

## 2020-10-12 MED ORDER — SENNOSIDES-DOCUSATE SODIUM 8.6-50 MG PO TABS
2.0000 | ORAL_TABLET | Freq: Every day | ORAL | Status: DC
  Administered 2020-10-12 – 2020-10-13 (×2): 2 via ORAL
  Filled 2020-10-12 (×2): qty 2

## 2020-10-12 MED ORDER — ZOLPIDEM TARTRATE 5 MG PO TABS: 5.0000 mg | ORAL_TABLET | Freq: Every evening | ORAL | Status: DC | PRN

## 2020-10-12 MED ORDER — DOCUSATE SODIUM 100 MG PO CAPS
100.0000 mg | ORAL_CAPSULE | Freq: Two times a day (BID) | ORAL | Status: DC
  Administered 2020-10-12 – 2020-10-13 (×3): 100 mg via ORAL
  Filled 2020-10-12 (×3): qty 1

## 2020-10-12 MED ORDER — ONDANSETRON HCL 4 MG/2ML IJ SOLN: 4.0000 mg | INTRAMUSCULAR | Status: DC | PRN

## 2020-10-12 MED ORDER — DIPHENHYDRAMINE HCL 25 MG PO CAPS: 25.0000 mg | ORAL_CAPSULE | Freq: Four times a day (QID) | ORAL | Status: DC | PRN

## 2020-10-12 MED ORDER — DIBUCAINE (PERIANAL) 1 % EX OINT: 1.0000 "application " | TOPICAL_OINTMENT | CUTANEOUS | Status: DC | PRN

## 2020-10-12 MED ORDER — PRENATAL MULTIVITAMIN CH
1.0000 | ORAL_TABLET | Freq: Every day | ORAL | Status: DC
  Administered 2020-10-12 – 2020-10-13 (×2): 1 via ORAL
  Filled 2020-10-12 (×2): qty 1

## 2020-10-12 MED ORDER — SODIUM CHLORIDE 0.9 % IV SOLN: 250.0000 mL | INTRAVENOUS | Status: DC | PRN

## 2020-10-12 MED ORDER — SODIUM CHLORIDE 0.9% FLUSH: 3.0000 mL | INTRAVENOUS | Status: DC | PRN

## 2020-10-12 MED ORDER — BENZOCAINE-MENTHOL 20-0.5 % EX AERO
1.0000 "application " | INHALATION_SPRAY | CUTANEOUS | Status: DC | PRN
  Administered 2020-10-12: 1 via TOPICAL
  Filled 2020-10-12: qty 56

## 2020-10-12 MED ORDER — FLEET ENEMA 7-19 GM/118ML RE ENEM: 1.0000 | ENEMA | Freq: Every day | RECTAL | Status: DC | PRN

## 2020-10-12 MED ORDER — SODIUM CHLORIDE 0.9% FLUSH
3.0000 mL | Freq: Two times a day (BID) | INTRAVENOUS | Status: DC
  Administered 2020-10-12: 2.5 mL via INTRAVENOUS

## 2020-10-12 MED ORDER — WITCH HAZEL-GLYCERIN EX PADS: 1.0000 "application " | MEDICATED_PAD | CUTANEOUS | Status: DC | PRN

## 2020-10-12 NOTE — Progress Notes (Signed)
Post Partum Day 1 Subjective: Eating, drinking, voiding, ambulating well. Some weakness in legs bilaterally. +flatus.  Lochia and pain wnl.  Denies dizziness, lightheadedness, or sob. No complaints.  Wants to go home today d/t 25yo child upset/crying/wants parents (currently w/ FOBs sister)  Objective: Blood pressure (!) 101/55, pulse 69, temperature 98.1 F (36.7 C), temperature source Oral, resp. rate 16, weight 74.3 kg, SpO2 100 %.  Physical Exam:  General: alert, cooperative and no distress Lochia: appropriate Uterine Fundus: firm Incision: n/a DVT Evaluation: No evidence of DVT seen on physical exam. Negative Homan's sign. No cords or calf tenderness. No significant calf/ankle edema.  Recent Labs    10/11/20 1742  HGB 11.1*  HCT 34.0*    Assessment/Plan: Doing well PPD#1, wants to go home (d/t 25yo daughter upset), understands 24hr state labs on baby can't be done until after 2200 tonight. Can check w/ pediatrician.  Continue bowel regimen for 4th degree lac Plans condoms Breastfeeding   LOS: 1 day   Cheral Marker 10/12/2020, 7:57 AM

## 2020-10-12 NOTE — Progress Notes (Signed)
Laurence Spates in-person interpreter, utilized for admission teaching, plan of care, assessment, and patient questions. All concerns addressed. Breakfast, lunch, and dinner order noted and submitted to [employer] for in-room delivery at designated times per pt request. Home food warmed and hot tea given to MOB and FOB. Interpreter left for the night, but will return tomorrow afternoon/evening; phone number given and written on board for use anytime.    Elvia Collum, RN

## 2020-10-13 ENCOUNTER — Other Ambulatory Visit: Payer: Self-pay | Admitting: Student

## 2020-10-13 DIAGNOSIS — N823 Fistula of vagina to large intestine: Secondary | ICD-10-CM

## 2020-10-13 MED ORDER — MEASLES, MUMPS & RUBELLA VAC IJ SOLR: 0.5000 mL | Freq: Once | INTRAMUSCULAR | 0 refills | Status: AC

## 2020-10-13 MED ORDER — BISACODYL 10 MG RE SUPP: 10.0000 mg | Freq: Every day | RECTAL | 0 refills | Status: DC | PRN

## 2020-10-13 MED ORDER — POLYETHYLENE GLYCOL 3350 17 G PO PACK: 17.0000 g | PACK | Freq: Every day | ORAL | 0 refills | Status: DC

## 2020-10-13 MED ORDER — DOCUSATE SODIUM 100 MG PO CAPS: 100.0000 mg | ORAL_CAPSULE | Freq: Three times a day (TID) | ORAL | 1 refills | Status: DC

## 2020-10-13 MED ORDER — DOCUSATE SODIUM 100 MG PO CAPS: 100.0000 mg | ORAL_CAPSULE | Freq: Two times a day (BID) | ORAL | 0 refills | Status: DC

## 2020-10-13 NOTE — Lactation Note (Signed)
This note was copied from a baby's chart. Lactation Consultation Note  Patient Name: Kelsey Mcdaniel [id number] Date: 10/13/2020 Reason for consult: Follow-up assessment;Term Age:25 hours   P2 mother whose infant is now 32 hours old.  This is a term baby at 39+4 weeks.    Pashto interpreter [phone]) used for interpretation.  Baby was asleep in mother's arms when I arrived.  Mother had no questions/concerns related to breast feeding.  She has been latching and feeding well. Last LATCH score was an 8; voiding and stooling well.  Baby has been discharged and family is awaiting a discharge for mother.  They are anxious to get home to their three year old.  Mother offered a manual pump, however, she politely declined.  Father present and had no questions.  RN in room during my visit.     Maternal Data Has patient been taught Hand Expression?: Yes  Feeding Mother's Current Feeding Choice: Breast Milk  LATCH Score Latch: Grasps breast easily, tongue down, lips flanged, rhythmical sucking.  Audible Swallowing: A few with stimulation  Type of Nipple: Everted at rest and after stimulation  Comfort (Breast/Nipple): Soft / non-tender  Hold (Positioning): Assistance needed to correctly position infant at breast and maintain latch. (ENCOURAGE DEEPER LATCH)  LATCH Score: 8   Lactation Tools Discussed/Used    Interventions Interventions: Education  Discharge Discharge Education: Engorgement and breast care Pump: Personal (Does not desire a personal or a manual pump)  Consult Status Consult Status: Complete Date: 10/13/20 Follow-up type: Call as needed    Arlenis Blaydes R Emberlie Gotcher 10/13/2020, 10:57 AM

## 2020-10-13 NOTE — Progress Notes (Signed)
AMN interpreter used for maternal/infant discharge instructions and followup appointments and medications. Kelsey Mcdaniel states understanding information.

## 2020-10-14 ENCOUNTER — Encounter: Payer: Medicaid Other | Admitting: Family Medicine

## 2020-10-25 ENCOUNTER — Inpatient Hospital Stay (HOSPITAL_COMMUNITY): Payer: Medicaid Other

## 2020-10-25 ENCOUNTER — Encounter (HOSPITAL_COMMUNITY): Payer: Self-pay | Admitting: Obstetrics & Gynecology

## 2020-10-25 ENCOUNTER — Other Ambulatory Visit: Payer: Self-pay

## 2020-10-25 ENCOUNTER — Inpatient Hospital Stay (HOSPITAL_COMMUNITY)
Admission: AD | Admit: 2020-10-25 | Discharge: 2020-10-25 | Disposition: A | Discharge status: Home / Self Care | Payer: Medicaid Other | Attending: Obstetrics & Gynecology | Admitting: Obstetrics & Gynecology

## 2020-10-25 DIAGNOSIS — O99893 Other specified diseases and conditions complicating puerperium: Secondary | ICD-10-CM | POA: Insufficient documentation

## 2020-10-25 DIAGNOSIS — M545 Low back pain, unspecified: Secondary | ICD-10-CM | POA: Insufficient documentation

## 2020-10-25 DIAGNOSIS — N939 Abnormal uterine and vaginal bleeding, unspecified: Secondary | ICD-10-CM | POA: Diagnosis not present

## 2020-10-25 DIAGNOSIS — Z79899 Other long term (current) drug therapy: Secondary | ICD-10-CM | POA: Insufficient documentation

## 2020-10-25 DIAGNOSIS — R42 Dizziness and giddiness: Secondary | ICD-10-CM | POA: Insufficient documentation

## 2020-10-25 LAB — COMPREHENSIVE METABOLIC PANEL
ALT: 17 U/L (ref 0–44)
AST: 18 U/L (ref 15–41)
Albumin: 3 g/dL — ABNORMAL LOW (ref 3.5–5.0)
Alkaline Phosphatase: 49 U/L (ref 38–126)
Anion gap: 9 (ref 5–15)
BUN: 13 mg/dL (ref 6–20)
CO2: 23 mmol/L (ref 22–32)
Calcium: 8.7 mg/dL — ABNORMAL LOW (ref 8.9–10.3)
Chloride: 105 mmol/L (ref 98–111)
Creatinine, Ser: 0.62 mg/dL (ref 0.44–1.00)
GFR, Estimated: >60 mL/min (ref >60)
Glucose, Bld: 86 mg/dL (ref 70–99)
Potassium: 4.1 mmol/L (ref 3.5–5.1)
Sodium: 137 mmol/L (ref 135–145)
Total Bilirubin: 0.2 mg/dL — ABNORMAL LOW (ref 0.3–1.2)
Total Protein: 7.2 g/dL (ref 6.5–8.1)

## 2020-10-25 LAB — CBC
HCT: 35.7 % — ABNORMAL LOW (ref 36.0–46.0)
Hemoglobin: 11.3 g/dL — ABNORMAL LOW (ref 12.0–15.0)
MCH: 28 pg (ref 26.0–34.0)
MCHC: 31.7 g/dL (ref 30.0–36.0)
MCV: 88.4 fL (ref 80.0–100.0)
Platelets: 347 10*3/uL (ref 150–400)
RBC: 4.04 MIL/uL (ref 3.87–5.11)
RDW: 14.4 % (ref 11.5–15.5)
WBC: 8.1 10*3/uL (ref 4.0–10.5)
nRBC: 0 % (ref 0.0–0.2)

## 2020-10-25 LAB — TYPE AND SCREEN
ABO/RH(D): A POS
Antibody Screen: NEGATIVE

## 2020-10-25 LAB — HCG, QUANTITATIVE, PREGNANCY: hCG, Beta Chain, Quant, S: 10 m[IU]/mL — ABNORMAL HIGH (ref <5)

## 2020-10-25 MED ORDER — OXYCODONE-ACETAMINOPHEN 5-325 MG PO TABS
1.0000 | ORAL_TABLET | Freq: Once | ORAL | Status: AC
  Administered 2020-10-25: 1 via ORAL
  Filled 2020-10-25: qty 1

## 2020-10-25 MED ORDER — MISOPROSTOL 200 MCG PO TABS
800.0000 ug | ORAL_TABLET | Freq: Once | ORAL | Status: AC
  Administered 2020-10-25: 800 ug via ORAL
  Filled 2020-10-25: qty 4

## 2020-10-25 NOTE — MAU Provider Note (Addendum)
History     CSN: [id number]  Arrival date and time: 10/25/20 0910  Event Date/Time  First Provider Initiated Contact with Patient 10/25/20 1004     Chief Complaint  Patient presents with  . Vaginal Bleeding  . Back Pain  . Dizziness   HPI Kelsey Mcdaniel is a 25 y.o. [id number] postpartum patient. She presents to MAU via [facility] with chief complaints of heavy vaginal bleeding, low back pain and dizzinnes. All three complaints are new onset this morning.  She endorses passing multiple medium-sized clots at home. Patient states she has been resting comfortably, only physical exertion was entertaining a friend for tea yesterday.   Patient is s.p SVD on 10/11/2020. Patient's delivery was complicated by 4th degree perineal laceration. Hx fourth degree with subsequent RV fistual repair. She reports that she is passing stool and flatus without difficulty and is maintaining the bowel regimen she started while inpatient.  Patient is exclusively breastfeeding. She received prenatal care with [facility]  OB History    Gravida  2   Para  2   Term  2   Preterm  0   AB  0   Living  2     SAB      IAB      Ectopic      Multiple  0   Live Births  2           Past Medical History:  Diagnosis Date  . Pregnancy induced hypertension     Past Surgical History:  Procedure Laterality Date  . RECTOVAGINAL FISTULA CLOSURE  2021    Family History  Problem Relation Age of Onset  . Heart disease Father     Social History   Tobacco Use  . Smoking status: Never Smoker  . Smokeless tobacco: Never Used  Vaping Use  . Vaping Use: Never used  Substance Use Topics  . Alcohol use: Never  . Drug use: Never    Allergies: No Known Allergies  Medications Prior to Admission  Medication Sig Dispense Refill Last Dose  . acetaminophen (TYLENOL) 500 MG tablet Take 1 tablet (500 mg total) by mouth every 6 (six) hours as needed. (Patient not taking: No sig reported) 60 tablet 1   . bisacodyl  (DULCOLAX) 10 MG suppository Place 1 suppository (10 mg total) rectally daily as needed for moderate constipation. 12 suppository 0   . Blood Pressure Monitoring (BLOOD PRESSURE MONITOR AUTOMAT) DEVI 1 Units by Does not apply route daily. 1 each 0   . docusate sodium (COLACE) 100 MG capsule Take 1 capsule (100 mg total) by mouth in the morning, at noon, and at bedtime. 120 capsule 1   . docusate sodium (COLACE) 100 MG capsule Take 1 capsule (100 mg total) by mouth 2 (two) times daily. 10 capsule 0   . famotidine (PEPCID) 20 MG tablet Take 1 tablet (20 mg total) by mouth daily. (Patient not taking: No sig reported) 60 tablet 1   . ferrous sulfate 325 (65 FE) MG EC tablet Take 1 tablet (325 mg total) by mouth every other day. 30 tablet 1   . polyethylene glycol (MIRALAX / GLYCOLAX) 17 g packet Take 17 g by mouth daily. 14 each 0   . polyethylene glycol (MIRALAX / GLYCOLAX) 17 g packet Take 17 g by mouth daily. 14 each 0   . Prenatal Vit-Fe Fumarate-FA (PRENATAL MULTIVITAMIN) TABS tablet Take 1 tablet by mouth daily at 12 noon. 60 tablet 2     Review of  Systems  Gastrointestinal: Positive for abdominal pain.  Genitourinary: Positive for vaginal bleeding.  Neurological: Positive for dizziness.  All other systems reviewed and are negative.  Physical Exam   Blood pressure 120/68, pulse 88, temperature 98.3 F (36.8 C), temperature source Oral, resp. rate 16, SpO2 98 %, unknown if currently breastfeeding.  Physical Exam Vitals reviewed. Exam conducted with a chaperone present.  Constitutional:      Appearance: Normal appearance. She is not ill-appearing.  Cardiovascular:     Rate and Rhythm: Normal rate and regular rhythm.     Pulses: Normal pulses.     Heart sounds: Normal heart sounds.  Pulmonary:     Effort: Pulmonary effort is normal.  Abdominal:     General: Bowel sounds are normal.     Tenderness: There is no abdominal tenderness.  Genitourinary:    General: Normal vulva.      Comments: Speculum exam not attempted. Bimanual confirms repair of 4th degree is intact. Lemon-sized clot removed with gentle manual manipulation of clot. Skin:    General: Skin is warm and dry.     Capillary Refill: Capillary refill takes less than 2 seconds.  Neurological:     Mental Status: She is alert and oriented to person, place, and time.  Psychiatric:        Mood and Affect: Mood normal.        Behavior: Behavior normal.        Thought Content: Thought content normal.        Judgment: Judgment normal.     MAU Course  Procedures   --Patient actively passing large clot on arrival to MAU. Additional clot manually removed during exam. Scant bleeding between these episodes  Orders Placed This Encounter  Procedures  . US PELVIS (TRANSABDOMINAL ONLY)  . CBC  . Comprehensive metabolic panel  . hCG, quantitative, pregnancy  . Diet NPO time specified  . Type and screen [facility]  . Saline lock IV   Patient Vitals for the past 24 hrs:  BP Temp Temp src Pulse Resp SpO2  10/25/20 1405 106/66 98.2 F (36.8 C) Oral 67 18 98 %  10/25/20 0944 120/68 98.3 F (36.8 C) Oral 88 16 98 %  10/25/20 0916 106/75 98.7 F (37.1 C) -- 95 16 99 %   Results for orders placed or performed during the hospital encounter of 10/25/20 (from the past 24 hour(s))  CBC     Status: Abnormal   Collection Time: 10/25/20 10:05 AM  Result Value Ref Range   WBC 8.1 4.0 - 10.5 K/uL   RBC 4.04 3.87 - 5.11 MIL/uL   Hemoglobin 11.3 (L) 12.0 - 15.0 g/dL   HCT 74.2 (L) 59.5 - 63.8 %   MCV 88.4 80.0 - 100.0 fL   MCH 28.0 26.0 - 34.0 pg   MCHC 31.7 30.0 - 36.0 g/dL   RDW 75.6 43.3 - 29.5 %   Platelets 347 150 - 400 K/uL   nRBC 0.0 0.0 - 0.2 %  Comprehensive metabolic panel     Status: Abnormal   Collection Time: 10/25/20 10:05 AM  Result Value Ref Range   Sodium 137 135 - 145 mmol/L   Potassium 4.1 3.5 - 5.1 mmol/L   Chloride 105 98 - 111 mmol/L   CO2 23 22 - 32 mmol/L   Glucose, Bld  86 70 - 99 mg/dL   BUN 13 6 - 20 mg/dL   Creatinine, Ser 1.88 0.44 - 1.00 mg/dL   Calcium  8.7 (L) 8.9 - 10.3 mg/dL   Total Protein 7.2 6.5 - 8.1 g/dL   Albumin 3.0 (L) 3.5 - 5.0 g/dL   AST 18 15 - 41 U/L   ALT 17 0 - 44 U/L   Alkaline Phosphatase 49 38 - 126 U/L   Total Bilirubin 0.2 (L) 0.3 - 1.2 mg/dL   GFR, Estimated >99 >77 mL/min   Anion gap 9 5 - 15  hCG, quantitative, pregnancy     Status: Abnormal   Collection Time: 10/25/20 10:05 AM  Result Value Ref Range   hCG, Beta Chain, Quant, S 10 (H) <5 mIU/mL  Type and screen [facility]     Status: None   Collection Time: 10/25/20 10:05 AM  Result Value Ref Range   ABO/RH(D) A POS    Antibody Screen NEG    Sample Expiration      10/28/2020,2359 Performed at [facility], [address], [place], [place] [address]    Meds ordered this encounter  Medications  . misoprostol (CYTOTEC) tablet 800 mcg    Retained products  . oxyCODONE-acetaminophen (PERCOCET/ROXICET) 5-325 MG per tablet 1 tablet   US PELVIS (TRANSABDOMINAL ONLY)  Result Date: 10/25/2020 CLINICAL DATA:  Heavy vaginal bleeding postpartum, spontaneous vaginal delivery on 10/12/2019; grade 4 perineal laceration EXAM: TRANSABDOMINAL ULTRASOUND OF PELVIS TECHNIQUE: Transabdominal ultrasound examination of the pelvis was performed including evaluation of the uterus, ovaries, adnexal regions, and pelvic cul-de-sac. COMPARISON:  None FINDINGS: Uterus Measurements: 9.8 x 5.9 x 8.1 cm = volume: 247 mL. Anteverted. Normal morphology without mass Endometrium Endometrial layers poorly defined. Large complex heterogeneous collections seen distending the endometrial canal measuring up to 28 mm thick, consisting of peripheral echogenic material at fundus and posteriorly as well as heterogeneous isoechoic to hypoechoic material which likely represents blood clot. No definite internal blood flow is seen within any of the material distending the endometrial canal.  Right ovary Measurements: 2.5 x 1.6 x 1.4 cm = volume: 2.9 mL. Normal morphology without mass Left ovary Measurements: 3.4 x 2.9 x 2.7 cm = volume: 14.0 mL. Normal morphology without mass Other findings:  No free pelvic fluid.  No adnexal masses. IMPRESSION: Endometrial canal distended 28 mm thick by a combination of complex isoechoic to hypoechoic material as well as more echogenic material at the superior and posterior aspects superiorly. Findings likely represent a large amount of hemorrhage/clot within the endometrial canal with suspicion of coexistent retained products of conception. Electronically Signed   By: Ulyses Southward M.D.   On: 10/25/2020 12:40   Assessment and Plan  --25 y.o. [id number] postpartum patient --4th degree perineal laceration, repair intact --Large clot/RPOC, confirmed with Dr. Macon Large patient is appropriate for Cytotec --800 mcg Cytotec given in MAU --Reviewed expectations for timing and amount of bleeding with pt and husband --Language barrier: AMN interpreters utilized for all patient interaction --Discharge home in stable condition  F/U: --Patient has office appointment tomorrow at [facility]  Calvert Cantor, CNM 10/25/2020, 3:55 PM

## 2020-10-25 NOTE — ED Provider Notes (Signed)
Emergency Medicine Provider Triage Evaluation Note  Kelsey Mcdaniel , a 25 y.o. female  was evaluated in triage.  Pt complains of post-partum bleeding. G2P2 w/ SVD, uncomplicated, on 4/19, discharged on 4/21. For the past few hours, she's had increased vaginal bleeding. She reports associated back pain and weakness.  Review of Systems  Positive: Vaginal bleeding, weakness, back pain Negative: No fevers, vomiting, syncope  Physical Exam  BP 106/75 (BP Location: Right Arm)   Pulse 95   Temp 98.7 F (37.1 C)   Resp 16   SpO2 99%  Gen:   Awake, no distress  Resp:  Normal effort  MSK:   Moves extremities without difficulty  Neuro:  Fluent speech, A & O x3  Medical Decision Making  Medically screening exam initiated at 9:21 AM.  Appropriate orders placed.  Marda Gaumond was informed that the remainder of the evaluation will be completed by another provider, this initial triage assessment does not replace that evaluation, and the importance of remaining in the ED until their evaluation is complete.  Pt 2 weeks post-partum w/ new vaginal bleeding.  Hemodynamically stable and comfortable on exam.  I discussed with MAU provider and they will see the patient for further work-up.  Patient transferred by wheelchair to MAU.   Simrat Kendrick, Ambrose Finland, MD 10/25/20 1020

## 2020-10-25 NOTE — MAU Note (Signed)
Kelsey Mcdaniel is a 25 y.o. here in MAU reporting: s/p SVD on 4/19 with 4th degree laceration. This morning reports increased vaginal bleeding. States 10 days after delivery bleeding had gotten much lighter and then this morning bleeding had soaked through her clothes and onto the sheets. When she went to the bathroom the bleeding was like "water from the tap". Had several large clots. Having back pain and is also dizzy.  Onset of complaint: today  Pain score: 5/10  Vitals:   10/25/20 0916 10/25/20 0944  BP: 106/75 120/68  Pulse: 95 88  Resp: 16 16  Temp: 98.7 F (37.1 C) 98.3 F (36.8 C)  SpO2: 99% 98%     Lab orders placed from triage: none

## 2020-10-25 NOTE — Progress Notes (Addendum)
Provider and RN at bedside. Plan of care discussed. Patient verbalized understanding and agreed.  Harlow Asa # [phone]

## 2020-10-25 NOTE — Discharge Instructions (Signed)
Obstetrics and Gynecology, 128(1), e1-e15. [id number]">  Care of a Perineal Tear The following information offers guidance about how to care for a perineal tear (laceration). Some women develop a perineal tear during a vaginal birth. This can happen as the baby emerges from the birth canal and the area between the vagina and the anus (perineum) is stretched, or a surgical cut is made (episiotomy). There are four degrees of perineal tears based on the depth and length of the laceration.  First degree. This involves a shallow tear at the edge of the vaginal opening that extends slightly into the perineal skin.  Second degree. This involves tearing described in first-degree perineal tear, and an additional deeper tear of the vaginal opening and perineal tissues. It may also include tearing of a muscle just under the perineal skin.  Third degree. This involves tearing described in first-degree and second-degree perineal tears. Tearing in the third degree extends into the muscle of the anus (anal sphincter).  Fourth degree. This involves tears described in all three degrees. Tearing in the fourth degree extends into the rectum. First-degree and second-degree perineal tears may or may not be stitched closed, depending on their location and appearance. Third-degree and fourth-degree perineal tears are stitched closed immediately after the baby's birth. What are the risks? Depending on the type of perineal tear you have, you may be at risk for:  Bleeding.  Developing a collection of blood in the perineal tear area (hematoma).  Pain when you urinate or have a bowel movement.  Infection at the site of the tear.  Fever.  Trouble controlling urination or bowel movements (incontinence).  Painful sex. How to care for a perineal tear Wound care  Take sitz baths as told by your health care provider to speed up healing. In a sitz bath, you sit down in warm water. A  sitz bath can be taken in one of two ways: ? Using an over-the-toilet sitz bath. ? Using a bathtub. The water should be deep enough to cover your hips and buttocks.  Wear a sanitary pad as told by your health care provider. Change the pad as often as told.  Leave stitches (sutures), skin glue, or adhesive tape in place. Do not remove adhesive tape unless your health care provider tells you to do so.  Check your wound every day for signs of infection. Check for: ? Redness, swelling, or pain. ? Fluid or blood. ? Warmth. ? Pus or a bad smell.      Managing pain  If directed, put ice on the affected area. To do this: ? Put ice in a plastic bag. ? Place a towel between your skin and the bag. ? Leave the ice on for 20 minutes, 2-3 times a day. ? Remove the ice if your skin turns bright red. This is very important. If you cannot feel pain, heat, or cold, you have a greater risk of damage to the area.  Apply a numbing spray to the perineal tear site as told by your health care provider. This may help with discomfort.  Take or apply over-the-counter and prescription medicines only as told by your health care provider.  If told, put about three witch hazel-containing hemorrhoid treatment pads on top of your sanitary pad. The witch hazel in the hemorrhoid pads helps with swelling and discomfort.  If comfortable, sit on an inflatable ring or pillow.   General instructions  Use a squirt bottle to squeeze warm water on your perineum after urinating to  clean the area. This is instead of wiping and should be done from front to back. Pat the area gently to dry it.  Do not have sex, use tampons, or place anything in your vagina for at least 6 weeks or as told by your health care provider.  Keep all follow-up visits, including postpartum visits. This is important. Contact a health care provider if:  Your pain is not relieved with medicines.  You have painful urination.  You have redness,  swelling, or pain around your tear.  You have fluid or blood coming from your tear.  Your tear feels warm to the touch.  You have pus or a bad smell coming from your tear.  You have a fever. Get help right away if:  Your tear opens.  You cannot urinate.  You have an increase in bleeding.  You have severe pain. Summary  A perineal tear is a tear (laceration) in the tissue between the opening of the vagina and the anus (perineum).  There are four degrees of perineal tears based on how deep and long the laceration is.  First-degree and second-degree perineal tears may or may not be stitched closed, depending on their location and appearance. Third-degree and fourth-degree perineal tears are stitched closed immediately after the baby's birth.  Get help right away if your tear opens, you cannot urinate, you have an increase in bleeding, or you have severe pain. This information is not intended to replace advice given to you by your health care provider. Make sure you discuss any questions you have with your health care provider. Document Revised: 02/25/2020 Document Reviewed: 02/25/2020 Elsevier Patient Education  2021 Elsevier Inc.  

## 2020-10-26 ENCOUNTER — Encounter: Payer: Self-pay | Admitting: Obstetrics and Gynecology

## 2020-10-26 ENCOUNTER — Ambulatory Visit (INDEPENDENT_AMBULATORY_CARE_PROVIDER_SITE_OTHER): Payer: Medicaid Other | Admitting: Obstetrics and Gynecology

## 2020-10-26 NOTE — Progress Notes (Signed)
Pt is in the office for vaginal incision check due to 4th degree laceration from delivery on 10-11-20.

## 2020-10-26 NOTE — Progress Notes (Signed)
25 yo P2 s/p SVD on 4/19 with 4th degree laceration presenting for perineal check. Patient was seen in MAU yesterday secondary to heavy vaginal bleeding with passage of clots. Patient was treated with cytotec and reports significant improvement in her vaginal bleeding. Patient reports a regular bowel movement  Past Medical History:  Diagnosis Date  . Pregnancy induced hypertension    Past Surgical History:  Procedure Laterality Date  . RECTOVAGINAL FISTULA CLOSURE  2021   Family History  Problem Relation Age of Onset  . Heart disease Father    Social History   Tobacco Use  . Smoking status: Never Smoker  . Smokeless tobacco: Never Used  Vaping Use  . Vaping Use: Never used  Substance Use Topics  . Alcohol use: Never  . Drug use: Never   ROS See pertinent in HPI. All other systems reviewed and non contributory  Blood pressure 106/72, pulse 96, weight 143 lb (64.9 kg), currently breastfeeding. GENERAL: Well-developed, well-nourished female in no acute distress.  ABDOMEN: Soft, nontender, nondistended. No organomegaly. PELVIC: Normal external female genitalia. Vagina is pink and rugated. 4th degree laceration healing well EXTREMITIES: No cyanosis, clubbing, or edema, 2+ distal pulses.  A/P 25 yo here for perineal check s/p 4th degree laceration - Perineum is healing well - continue stool softeners - follow up for postpartum visit in a few weeks

## 2020-11-16 ENCOUNTER — Encounter: Payer: Self-pay | Admitting: Family Medicine

## 2020-11-16 ENCOUNTER — Ambulatory Visit (INDEPENDENT_AMBULATORY_CARE_PROVIDER_SITE_OTHER): Payer: Medicaid Other | Admitting: Family Medicine

## 2020-11-16 ENCOUNTER — Other Ambulatory Visit: Payer: Self-pay

## 2020-11-16 VITALS — BP 123/77 | HR 75 | Wt 148.0 lb

## 2020-11-16 DIAGNOSIS — Z30011 Encounter for initial prescription of contraceptive pills: Secondary | ICD-10-CM

## 2020-11-16 MED ORDER — NORETHINDRONE 0.35 MG PO TABS: 1.0000 | ORAL_TABLET | Freq: Every day | ORAL | 3 refills | Status: DC

## 2020-11-16 NOTE — Progress Notes (Signed)
Post Partum Visit Note  Kelsey Mcdaniel is a 25 y.o. G22P2002 female who presents for a postpartum visit. She is 5 weeks postpartum following a normal spontaneous vaginal delivery.  I have fully reviewed the prenatal and intrapartum course. The delivery was at 39 gestational weeks.  Anesthesia: IV pain meds. Postpartum course has been complicated by 4th degree laceration repaired. Baby is doing well. Baby is feeding by breast. Bleeding no bleeding. Bowel function is normal. Bladder function is normal. Patient is not sexually active. Contraception method is none. Postpartum depression screening: negative. Has control of her bowels. Some pain at rectum when walking.   The pregnancy intention screening data noted above was reviewed. Potential methods of contraception were discussed. The patient elected to proceed with Oral Contraceptive.    Edinburgh Postnatal Depression Scale - 11/16/20 1453      Edinburgh Postnatal Depression Scale:  In the Past 7 Days   I have been able to laugh and see the funny side of things. 0    I have looked forward with enjoyment to things. 0    I have blamed myself unnecessarily when things went wrong. 0    I have been anxious or worried for no good reason. 0    I have felt scared or panicky for no good reason. 0    Things have been getting on top of me. 0    I have been so unhappy that I have had difficulty sleeping. 0    I have felt sad or miserable. 0    I have been so unhappy that I have been crying. 0    The thought of harming myself has occurred to me. 0    Edinburgh Postnatal Depression Scale Total 0           Health Maintenance Due  Topic Date Due  . HPV VACCINES (1 - 2-dose series) Never done    The following portions of the patient's history were reviewed and updated as appropriate: allergies, current medications, past family history, past medical history, past social history, past surgical history and problem list.  Review of Systems Pertinent  items noted in HPI and remainder of comprehensive ROS otherwise negative.  Objective:  BP 123/77   Pulse 75   Wt 148 lb (67.1 kg)   Breastfeeding Yes   BMI 23.99 kg/m    General:  alert, cooperative and appears stated age  Lungs: normal effort  Heart:  regular rate and rhythm  Abdomen: soft, non-tender; bowel sounds normal; no masses,  no organomegaly   GU exam:  small defect with agglutination of labia above introitus rectum. Rectal exam reveals intact mucosa without evidence of fistula       Assessment:   Normal postpartum exam.  Oral contraception initial prescription - since still nursing trial of POPs - Plan: norethindrone (MICRONOR) 0.35 MG tablet  Postpartum care and examination  Fourth degree perineal laceration during delivery - healing well. no evidence of fistula at this time    Plan:   Essential components of care per ACOG recommendations:  1.  Mood and well being: Patient with positive depression screening today. Reviewed local resources for support.  - Patient tobacco use? No.   - hx of drug use? No.    2. Infant care and feeding:  -Patient currently breastmilk feeding? Yes. Reviewed importance of draining breast regularly to support lactation.  -Social determinants of health (SDOH) reviewed in EPIC. The following needs were identified food bank  3. Sexuality,  contraception and birth spacing - Patient does not know want a pregnancy in the next year.  Desired family size is 4 children.  - Reviewed forms of contraception in tiered fashion. Patient desired oral progesterone-only contraceptive today.   - Discussed birth spacing of 18 months  4. Sleep and fatigue -Encouraged family/partner/community support of 4 hrs of uninterrupted sleep to help with mood and fatigue  5. Physical Recovery  - Discussed patients delivery and complications. She describes her labor as good. - Patient had a Vaginal problems after delivery including 4th degree and h/o RV fisula.  Patient had a 4th degree laceration. Perineal healing reviewed. Patient expressed understanding - Patient has urinary incontinence? No. - Patient is not safe to resume physical and sexual activity for 2 more weeks  6.  Health Maintenance - HM due items addressed Yes - Last pap smear  Diagnosis  Date Value Ref Range Status  09/16/2020   Final   - Negative for intraepithelial lesion or malignancy (NILM)   Pap smear not done at today's visit.  -Breast Cancer screening indicated? No.   7. Chronic Disease/Pregnancy Condition follow up: None   Reva Bores, MD [facility], [facility]

## 2020-12-21 ENCOUNTER — Ambulatory Visit (INDEPENDENT_AMBULATORY_CARE_PROVIDER_SITE_OTHER): Payer: Medicaid Other | Admitting: Family Medicine

## 2020-12-21 ENCOUNTER — Encounter: Payer: Self-pay | Admitting: Family Medicine

## 2020-12-21 ENCOUNTER — Other Ambulatory Visit: Payer: Self-pay

## 2020-12-21 NOTE — Patient Instructions (Signed)
Thank you for coming to see me today. It was a pleasure. Today we talked about:   Continue to take your pills.  We have scheduled you with the Urogynecologist specialist for further evaluation.    Do NOT have intercourse until a doctor says that you can.  Please follow-up with PCP in 1 month.  If you have any questions or concerns, please do not hesitate to call the office at [phone].  Best,   Luis Abed, DO

## 2020-12-21 NOTE — Progress Notes (Signed)
    SUBJECTIVE:   CHIEF COMPLAINT / HPI:   Perineal pain Patient had a vaginal delivery on 4/19 at 39 weeks and 4 days with 1/4 degree laceration.  She has been seen for postpartum follow-up, most recently on 5/25.  Her exam at that time showed a small defect with agglutination of labia above introitus rectum, no evidence of fistula at that time. Sexual intercourse: has not had On POPs and having some bleeding off and on Main concern is that she has difficulty controlling her bowel gas and feels it coming into her vagina as well No urine leakage No Bms in vagina Has occasional pain with defecation when she is constipated  iPad Pashto interpretor used for entirety of encounter  PERTINENT  PMH / PSH: Hx fourth-degree laceration and prior rectovaginal fistula  OBJECTIVE:   BP 112/60   Pulse 86   Ht 5\' 5"  (1.651 m)   Wt 155 lb 6.4 oz (70.5 kg)   SpO2 99%   Breastfeeding Yes   BMI 25.86 kg/m    Physical Exam: General: In NAD Respiratory: Breathing comfortably on room air GU: Pelvic exam performed with patient supine.  Chaperone (Dr. ) in room.  Bilateral labia with small area of agglutination at inferior aspect of introitus.  No obvious rectovaginal fistula observed with speculum exam.  Good rectal tone, no palpable defect on rectal exam.     ASSESSMENT/PLAN:   Fourth degree laceration of perineum during delivery, postpartum Given patient's concern for possible fistula with gas in both vagina and rectum, history of fourth degree laceration, and history of prior recto-vaginal fistula, will refer to urogynecology for further evaluation.  No obvious fistula seen on examination today.  Ensured patient has enough Miralax and to continue regimen to keep stools soft.  F/U PCP in 1 month.       Miquel Dunn, DO [facility]

## 2020-12-21 NOTE — Assessment & Plan Note (Addendum)
Given patient's concern for possible fistula with gas in both vagina and rectum, history of fourth degree laceration, and history of prior recto-vaginal fistula, will refer to urogynecology for further evaluation.  No obvious fistula seen on examination today.  Ensured patient has enough Miralax and to continue regimen to keep stools soft.  F/U PCP in 1 month.

## 2021-01-13 ENCOUNTER — Other Ambulatory Visit: Payer: Self-pay

## 2021-01-13 ENCOUNTER — Ambulatory Visit (INDEPENDENT_AMBULATORY_CARE_PROVIDER_SITE_OTHER): Payer: Medicaid Other | Admitting: Family Medicine

## 2021-01-13 DIAGNOSIS — D509 Iron deficiency anemia, unspecified: Secondary | ICD-10-CM | POA: Insufficient documentation

## 2021-01-13 DIAGNOSIS — Z30011 Encounter for initial prescription of contraceptive pills: Secondary | ICD-10-CM

## 2021-01-13 DIAGNOSIS — K5900 Constipation, unspecified: Secondary | ICD-10-CM

## 2021-01-13 HISTORY — DX: Constipation, unspecified: K59.00

## 2021-01-13 MED ORDER — POLYETHYLENE GLYCOL 3350 17 G PO PACK: 17.0000 g | PACK | Freq: Two times a day (BID) | ORAL | 0 refills | Status: AC

## 2021-01-13 MED ORDER — NORETHINDRONE 0.35 MG PO TABS: 1.0000 | ORAL_TABLET | Freq: Every day | ORAL | 3 refills | Status: DC

## 2021-01-13 MED ORDER — DOCUSATE SODIUM 100 MG PO CAPS: 100.0000 mg | ORAL_CAPSULE | Freq: Two times a day (BID) | ORAL | 0 refills | Status: DC

## 2021-01-13 NOTE — Assessment & Plan Note (Signed)
-   s/p PO iron x 2 months, will repeat CBC, lab closed by end of visit, plan to do at next appt, no vaginal bleeding

## 2021-01-13 NOTE — Patient Instructions (Addendum)
It was wonderful to see you today.  Please bring ALL of your medications with you to every visit.   Today we talked about:  - You have a follow up with urogynecology on 03/28/2021  - You can get your lab work checked anytime our lab is open, M-F 830-5 pm - Keep taking Miralax twice daily and Colace twice daily   Thank you for choosing [facility].   Please call [phone] with any questions about today's appointment.  Please be sure to schedule follow up at the front  desk before you leave today.   Burley Saver, MD  Family Medicine

## 2021-01-13 NOTE — Progress Notes (Signed)
    SUBJECTIVE:   CHIEF COMPLAINT / HPI:   History of fourth degree s/p repair- had been incontinent of gas, no stool or urinary incontinence. Has appointment with urogyn on 03/28/2021. Has been seen by OBGYN already who did not have further recommendations at this time. No blood in stool. Having BM daily but sometimes hard. Some thin pink tinged vaginal discharge, no burning or itching. No vaginal bleeding or clots.  Anemia- postpartum, had been prescribed PO iron, she has completed it.  Now taking progesterone only pills for birth control, has one more month but wants refills.  Entire appt completed with Pashto interpretor.  PERTINENT  PMH / PSH: history of rectovaginal fistula s/p repair  OBJECTIVE:   BP 108/64   Pulse 85   Ht 5\' 5"  (1.651 m)   Wt 157 lb 9.6 oz (71.5 kg)   SpO2 98%   BMI 26.23 kg/m   General: alert & oriented, no apparent distress, well groomed HEENT: normocephalic, atraumatic, EOM grossly intact, oral mucosa moist, neck supple Respiratory: normal respiratory effort GI: non-distended, non-tender, + bowel sounds Skin: no rashes, no jaundice Psych: appropriate mood and affect   ASSESSMENT/PLAN:   Fourth degree laceration of perineum during delivery, postpartum - still having gas incontinence, denies urine or stool incontinence - follow up scheduled with urogynecology, date and time of appointment given to her today - discussed continuing Miralax and BID colace to maintain regular soft BM  Constipation - having BM daily but sometimes hard, no blood in stools - Continue BID Miralax and colace, scripts printed today  Iron deficiency anemia - s/p PO iron x 2 months, will repeat CBC, lab closed by end of visit, plan to do at next appt, no vaginal bleeding     , MD [facility]

## 2021-01-13 NOTE — Assessment & Plan Note (Addendum)
-   still having gas incontinence, denies urine or stool incontinence - follow up scheduled with urogynecology, date and time of appointment given to her today - discussed continuing Miralax and BID colace to maintain regular soft BM

## 2021-01-13 NOTE — Assessment & Plan Note (Signed)
-   having BM daily but sometimes hard, no blood in stools - Continue BID Miralax and colace, scripts printed today

## 2021-02-16 ENCOUNTER — Ambulatory Visit: Payer: Medicaid Other | Attending: Student | Admitting: Physical Therapy

## 2021-03-22 ENCOUNTER — Ambulatory Visit: Payer: Medicaid Other | Attending: Student | Admitting: Physical Therapy

## 2021-03-22 ENCOUNTER — Other Ambulatory Visit: Payer: Self-pay

## 2021-03-22 ENCOUNTER — Ambulatory Visit: Payer: Medicaid Other | Admitting: Physical Therapy

## 2021-03-22 DIAGNOSIS — R279 Unspecified lack of coordination: Secondary | ICD-10-CM | POA: Insufficient documentation

## 2021-03-22 DIAGNOSIS — M6281 Muscle weakness (generalized): Secondary | ICD-10-CM | POA: Insufficient documentation

## 2021-03-22 NOTE — Therapy (Addendum)
[facility] [address] [place], [place], [address] Phone: [phone]   Fax:  [phone]  Physical Therapy Evaluation  Patient Details  Name: Kelsey Mcdaniel MRN: [id number] Date of Birth: 1995-10-07 Referring Provider (PT): O70.3 (ICD-10-CM) - Fourth degree laceration of perineum during delivery, postpartum   Encounter Date: 03/22/2021   PT End of Session - 03/22/21 1702     Visit Number 1    Date for PT Re-Evaluation 06/21/21    Authorization Type healthy blue    PT Start Time 1639   once interpreter present and pt able to start sesssion.   PT Stop Time 1700    PT Time Calculation (min) 21 min    Activity Tolerance Patient tolerated treatment well    Behavior During Therapy WFL for tasks assessed/performed             Past Medical History:  Diagnosis Date   Pregnancy induced hypertension     Past Surgical History:  Procedure Laterality Date   RECTOVAGINAL FISTULA CLOSURE  2021    There were no vitals filed for this visit.    Subjective Assessment - 03/22/21 1637     Subjective Pt reports 4th degree tear with first child who is 3, and now has second child who is 5 months currently, only having symptoms of gas incontinence.    Pertinent History fourth degree tear with first child, recto-vaginal fistula    Currently in Pain? No/denies                [facility] PT Assessment - 03/22/21 0001       Assessment   Medical Diagnosis Kooistra, Charlesetta Garibaldi, CNM    Referring Provider (PT) O70.3 (ICD-10-CM) - Fourth degree laceration of perineum during delivery, postpartum    Onset Date/Surgical Date --   3 years   Prior Therapy no      Precautions   Precautions None      Restrictions   Weight Bearing Restrictions No      Balance Screen   Has the patient fallen in the past 6 months No    Has the patient had a decrease in activity level because of a fear of falling?  No    Is the patient  reluctant to leave their home because of a fear of falling?  No      Home Nurse, mental health Private residence    Living Arrangements Spouse/significant other;Children;Other relatives;Parent      Prior Function   Level of Independence Independent      Cognition   Overall Cognitive Status Within Functional Limits for tasks assessed      Sensation   Light Touch Appears Intact      Coordination   Gross Motor Movements are Fluid and Coordinated Yes    Fine Motor Movements are Fluid and Coordinated Yes      Posture/Postural Control   Posture/Postural Control Postural limitations    Postural Limitations Rounded Shoulders;Decreased thoracic kyphosis;Posterior pelvic tilt      ROM / Strength   AROM / PROM / Strength AROM;Strength      AROM   Overall AROM Comments WFL      Strength   Overall Strength Comments bil hips 3/5 throughout and decreased core strength noted.      Flexibility   Soft Tissue Assessment /Muscle Length yes      Palpation   Spinal mobility decreased rib mobility with expansion, decreased thoracic mobility with P/A  Objective measurements completed on examination: See above findings.     Pelvic Floor Special Questions - 03/22/21 0001     Prior Pelvic/Prostate Exam Yes   (-) for current fistula   Are you Pregnant or attempting pregnancy? No    Prior Pregnancies Yes    Number of Pregnancies 2    Number of Vaginal Deliveries 2    Any difficulty with labor and deliveries Yes   4th degree tear   Currently Sexually Active Yes    Is this Painful No    History of sexually transmitted disease No    Marinoff Scale no problems    Urinary Leakage No    Urinary urgency No    Urinary frequency no    Fecal incontinence No    Falling out feeling (prolapse) No    Pelvic Floor Internal Exam deferred at this time                       PT Education - 03/22/21 1702     Education Details Pt educated  on exam findings, POC and HEP    Person(s) Educated Patient    Methods Explanation;Demonstration;Tactile cues;Verbal cues    Comprehension Verbalized understanding;Returned demonstration              PT Short Term Goals - 03/22/21 1716       PT SHORT TERM GOAL #1   Title pt to be I with HEP    Time 4    Period Weeks    Status New    Target Date 04/19/21      PT SHORT TERM GOAL #2   Title pt to demonstrate 5/5 hip strength for improved pelvic stability    Time 4    Period Weeks    Status New    Target Date 04/19/21      PT SHORT TERM GOAL #3   Title pt to report no more than one instance gas incontinence per day for improved QOL    Time 4    Period Weeks    Status Revised    Target Date 04/19/21               PT Long Term Goals - 03/22/21 1717       PT LONG TERM GOAL #1   Title pt to be I with advanced HEP    Time 3    Period Months    Status New    Target Date 06/21/21      PT LONG TERM GOAL #2   Title pt to report no more than 2 instances of gas incontinence per month for improved QOL    Time 3    Period Months    Status New    Target Date 06/21/21      PT LONG TERM GOAL #3   Title pt to demonstrated improved lifting mechanics of 20# for improved safety and mobility with lifting children and decreased strain at pelvic floor    Time 3    Period Months    Status New    Target Date 06/21/21      PT LONG TERM GOAL #4   Title pt to demonstrate improved breathing mechanics with proper PF/core contrac/relax at least 75% of the time for decreased stain at pelvic floor    Time 3    Period Months    Status New    Target Date 06/21/21  Plan - 03/22/21 1703     Clinical Impression Statement Pt presenting to clininc as 25yo female referred for fourth degree laceration of perineaum during delivery postpartum however pt reports this was with her first child 3 years ago and now has had second child who is 48 months old and did  not have the tear with this child. 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  Sexual intercourse: has not had  On POPs and having some bleeding off and on  Main concern is that she has difficulty controlling her bowel gas and feels it coming into her vagina as well  No urine leakage  No Bms in vagina  Has occasional pain with defecation when she is constipated.  No obvious fistula seen on examination today." at 6/29 note. Pt reported today she had incontinence of gas and reports she thinks she feels this from rectum and vagina as well. Pt unfortunately had great difficulty with language barrier even with multiple interpreters attempting and pt requiring female only, pt able to state simple problems and answer yes or no questions. Internal assessment deferred due to language and cultural carriers until able to insure pt consent and understanding. Pt was found to have bil hip weakness and decreased thoracic mobility. Still nursing 12 month old, and had no DRA but did have core weakness. Pt would benefit from additional PT to address needs and better assess pelvic floor concerns.    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    Examination-Activity Limitations Continence    Examination-Participation Restrictions Interpersonal Relationship;Community Activity    Stability/Clinical Decision Making Evolving/Moderate complexity    Rehab Potential Good    PT Frequency 1x / week    PT Duration 8 weeks    PT Treatment/Interventions ADLs/Self Care Home Management;Aquatic Therapy;Functional mobility  training;Therapeutic activities;Therapeutic exercise;Neuromuscular re-education;Manual techniques;Patient/family education;Passive range of motion;Scar mobilization;Energy conservation;Taping    PT Next Visit Plan assess pelvic floor    Consulted and Agree with Plan of Care Patient             Patient will benefit from skilled therapeutic intervention in order to improve the following deficits and impairments:  Postural dysfunction, Decreased strength, Decreased mobility, Impaired flexibility, Improper body mechanics, Decreased endurance, Decreased coordination  Visit Diagnosis: Lack of coordination - Plan: PT plan of care cert/re-cert  Muscle weakness (generalized) - Plan: PT plan of care cert/re-cert     Problem List Patient Active Problem List   Diagnosis Date Noted   Constipation 01/13/2021   Iron deficiency anemia 01/13/2021   Fourth degree laceration of perineum during delivery, postpartum 12/21/2020   History of gestational hypertension 06/30/2020   Encounter for health examination of refugee 06/02/2020   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 Therefore if needed post surgery, can be re-evaluated as  needed.    Remaining deficits: Unable to formally reassess as pt only present for initial evaluation   Education / Equipment: Only present for initial evaluation   Patient agrees to discharge. Patient goals were not met. Patient is being discharged due to a change in medical status. Thank you for the referral.  Stacy Gardner, PT, DPT 10/07/228:25 AM   [facility] [address] [place], [place], [address] Phone: [phone]   Fax:  [phone]  Name: Kelsey Mcdaniel MRN: [id number] Date of Birth: 1995-08-31

## 2021-03-27 NOTE — Progress Notes (Signed)
[facility] New Patient Evaluation and Consultation  Referring Provider: Westley Chandler, MD PCP: Billey Co, MD Date of Service: 03/28/2021  SUBJECTIVE Chief Complaint: New Patient (Initial Visit) (PVR-2)  History of Present Illness: Kelsey Mcdaniel is a 25 y.o. Asian middle [place] female seen in consultation at the request of Dr. Manson Passey for evaluation of fistula.    Review of records significant for: S/p SVD on 10/11/20 and had a 4th degree laceration. Has a history fo rectovaginal fistula s/p repair in [place] following breech vaginal birth.  Current:  Patient states that with the first delivery, she had an episiotomy that went all the way to the rectum. She had bowel incontinence and had 3 procedure to repair the vagina. She reports there was a connection between the rectum and vagina.   After the last delivery in April, she feels gas coming from the front and the back of the vagina. Cannot hold it. Feels that her stomach is more bloated as well. Bowel movements have been normal, has not noticed any stool in the vagina. Has been taking miralax and high fiber diet which has helped.   No issues with urination or leakage of urine. Had some urinary leakage when she had a cold with cough a few months ago, but this has since resolved.  Denies pain with intercourse. Feels "loose".  Past Medical History:  Past Medical History:  Diagnosis Date   Pregnancy induced hypertension      Past Surgical History:   Past Surgical History:  Procedure Laterality Date   RECTOVAGINAL FISTULA CLOSURE  2021     Past OB/GYN History: OB History  Gravida Para Term Preterm AB Living  2 2 2  0 0 2  SAB IAB Ectopic Multiple Live Births        0 2    # Outcome Date GA Lbr Len/2nd Weight Sex Delivery Anes PTL Lv  2 Term 10/11/20 [redacted]w[redacted]d 01:20 / 00:07 7 lb 4.8 oz (3.311 kg) F Vag-Spont None  LIV     Birth Comments: None  1 Term 04/20/19    F Vag-Breech None N LIV     Complications:  History of episiotomy      Medications: She has a current medication list which includes the following prescription(s): prenatal multivitamin.   Allergies: Patient has No Known Allergies.   Social History:  Social History   Tobacco Use   Smoking status: Never   Smokeless tobacco: Never  Vaping Use   Vaping Use: Never used  Substance Use Topics   Alcohol use: Never   Drug use: Never    Family History:   Family History  Problem Relation Age of Onset   Heart disease Father      Review of Systems: Review of Systems  Constitutional:  Negative for fever, malaise/fatigue and weight loss.  Respiratory:  Negative for cough, shortness of breath and wheezing.   Cardiovascular:  Negative for chest pain, palpitations and leg swelling.  Gastrointestinal:  Negative for abdominal pain and blood in stool.  Genitourinary:  Negative for dysuria.  Musculoskeletal:  Negative for myalgias.  Skin:  Negative for rash.  Neurological:  Negative for dizziness and headaches.  Endo/Heme/Allergies:  Does not bruise/bleed easily.  Psychiatric/Behavioral:  Negative for depression. The patient is not nervous/anxious.     OBJECTIVE Physical Exam: Vitals:   03/28/21 0947  BP: 106/70  Pulse: 82  Weight: 163 lb (73.9 kg)  Height: 5' 2.21" (1.58 m)    Physical Exam Constitutional:  General: She is not in acute distress. Pulmonary:     Effort: Pulmonary effort is normal.  Abdominal:     General: There is no distension.     Palpations: Abdomen is soft.     Tenderness: There is no abdominal tenderness. There is no rebound.  Musculoskeletal:        General: No swelling. Normal range of motion.  Skin:    General: Skin is warm and dry.     Findings: No rash.  Neurological:     Mental Status: She is alert and oriented to person, place, and time.  Psychiatric:        Mood and Affect: Mood normal.        Behavior: Behavior normal.     GU / Detailed Urogynecologic Evaluation:  Pelvic  Exam: Normal external labia Bartholin's and Skene's glands normal in appearance; urethral meatus normal in appearance, no urethral masses or discharge.   CST: negative  Perineum is only a thin band of tissue with dovetail cloacal connection of rectum to vagina behind tissue band.  Speculum exam reveals normal vaginal mucosa without atrophy. Cervix normal appearance. Uterus normal single, nontender. Adnexa no mass, fullness, tenderness.    Pelvic floor strength III/V, puborectalis II/V external anal sphincter I/V  Rectal Exam:  Diminished sphincter tone, cloacal defect with thin band for perineal body. External anal sphincter palpable inferiorly but absent from about 10 o'clock to 2 o'clock. no distal rectocele.  Pelvic floor musculature: Right levator non-tender, Right obturator non-tender, Left levator non-tender, Left obturator non-tender  POP-Q:   POP-Q  -3                                            Aa   -3                                           Ba  -7.5                                              C   3                                            Gh  1.5                                            Pb  9                                            tvl   -3                                            Ap  -3  Bp  -8                                              D       Post-Void Residual (PVR) by Bladder Scan: In order to evaluate bladder emptying, we discussed obtaining a postvoid residual and she agreed to this procedure.  Procedure: The ultrasound unit was placed on the patient's abdomen in the suprapubic region after the patient had voided. A PVR of 2 ml was obtained by bladder scan.  Laboratory Results: Unable to leave urine sample  ASSESSMENT AND PLAN Kelsey Mcdaniel is a 25 y.o. with:  1. Cloacal malformation    - Cloacal defect with thin band of perineal tissue distally. Otherwise rectum/ vagina is intact.  - We  discussed repair with sphincteroplasty and perineoplasty. Since she has had several of these repairs previously (no operative report available as this was done in [place]), we reviewed that there is a liklihood that this repair can fail. Since she had an exam prior to her delivery which showed an intact perineum, this was likely due to her most recent vaginal delivery.  - We discussed that if this next repair fails, she may need another procedure with the assistance of plastics (flaps) to build the perineum.  - Will plan for perineoplasty with anal sphincteroplasty. Will notify scheduler.  - We also reviewed that for future deliveries, would recommend cesarean delivery to prevent further complications and perineal breakdown.   Return for pre op   Marguerita Beards, MD   Medical Decision Making:  - Review and summation of prior records

## 2021-03-28 ENCOUNTER — Encounter: Payer: Self-pay | Admitting: Obstetrics and Gynecology

## 2021-03-28 ENCOUNTER — Ambulatory Visit (INDEPENDENT_AMBULATORY_CARE_PROVIDER_SITE_OTHER): Payer: Medicaid Other | Admitting: Obstetrics and Gynecology

## 2021-03-28 ENCOUNTER — Other Ambulatory Visit: Payer: Self-pay

## 2021-03-28 VITALS — BP 106/70 | HR 82 | Ht 62.21 in | Wt 163.0 lb

## 2021-03-28 DIAGNOSIS — Q437 Persistent cloaca: Secondary | ICD-10-CM

## 2021-03-30 NOTE — Progress Notes (Signed)
[facility] Pre-Operative visit  Subjective Chief Complaint: Kelsey Mcdaniel presents for a preoperative encounter.   History of Present Illness: Kelsey Mcdaniel is a 25 y.o. female who presents for preoperative visit.  She is scheduled to undergo  on perineoplasty and sphincteroplasty.  Her symptoms include anal incontinence after vaginal delivery.   Past Medical History:  Diagnosis Date   Pregnancy induced hypertension      Past Surgical History:  Procedure Laterality Date   RECTOVAGINAL FISTULA CLOSURE  2021    has No Known Allergies.   Family History  Problem Relation Age of Onset   Heart disease Father     Social History   Tobacco Use   Smoking status: Never   Smokeless tobacco: Never  Vaping Use   Vaping Use: Never used  Substance Use Topics   Alcohol use: Never   Drug use: Never     Review of Systems was negative for a full 10 system review except as noted in the History of Present Illness.   Current Outpatient Medications:    Misc. Devices (SITZ BATH) MISC, 1 each by Does not apply route daily., Disp: 1 each, Rfl: 0   acetaminophen (TYLENOL) 500 MG tablet, Take 1 tablet (500 mg total) by mouth every 6 (six) hours as needed (pain)., Disp: 30 tablet, Rfl: 0   ibuprofen (ADVIL) 600 MG tablet, Take 1 tablet (600 mg total) by mouth every 6 (six) hours as needed., Disp: 30 tablet, Rfl: 0   magnesium hydroxide (MILK OF MAGNESIA) 400 MG/5ML suspension, Take 355 mLs by mouth once for 1 dose. Take the day before surgery for bowel prep, Disp: 355 mL, Rfl: 0   oxyCODONE (OXY IR/ROXICODONE) 5 MG immediate release tablet, Take 1 tablet (5 mg total) by mouth every 4 (four) hours as needed for severe pain., Disp: 10 tablet, Rfl: 0   polyethylene glycol powder (GLYCOLAX/MIRALAX) 17 GM/SCOOP powder, Take 17 g by mouth daily. Drink 17g (1 scoop) dissolved in water per day., Disp: 255 g, Rfl: 0   Prenatal Vit-Fe Fumarate-FA (PRENATAL MULTIVITAMIN) TABS tablet, Take 1  tablet by mouth daily at 12 noon. (Patient not taking: No sig reported), Disp: 60 tablet, Rfl: 2   Objective Vitals:   03/31/21 1008  BP: 118/78  Pulse: 88    Gen: NAD CV: S1 S2 RRR Lungs: Clear to auscultation bilaterally Abd: soft, nontender  Previous Pelvic Exam showed: Perineum is only a thin band of tissue with dovetail cloacal connection of rectum to vagina behind tissue band.  Speculum exam reveals normal vaginal mucosa without atrophy. Cervix normal appearance. Uterus normal single, nontender. Adnexa no mass, fullness, tenderness.     Pelvic floor strength III/V, puborectalis II/V external anal sphincter I/V   Rectal Exam:  Diminished sphincter tone, cloacal defect with thin band for perineal body. External anal sphincter palpable inferiorly but absent from about 10 o'clock to 2 o'clock. no distal rectocele.   Pelvic floor musculature: Right levator non-tender, Right obturator non-tender, Left levator non-tender, Left obturator non-tender    Assessment/ Plan  Assessment: The patient is a 25 y.o. year old scheduled to undergo perineoplasty and sphincteroplasty. Verbal consent was obtained for these procedures.  Plan: General Surgical Consent: The patient has previously been counseled on alternative treatments, and the decision by the patient and provider was to proceed with the procedure listed above.  For all procedures, there are risks of bleeding, infection, damage to surrounding organs including but not limited to bowel, bladder, blood vessels, ureters and  nerves, and need for further surgery if an injury were to occur. These risks are all low with minimally invasive surgery.   There are risks of numbness and weakness at any body site or buttock/rectal pain.  It is possible that baseline pain can be worsened by surgery, either with or without mesh. If surgery is vaginal, there is also a low risk of possible conversion to laparoscopy or open abdominal incision where  indicated. Very rare risks include blood transfusion, blood clot, heart attack, pneumonia, or death.   She does not accept blood transfusions- we discussed the use of cell saver and will see if we can obtain for the surgery.   Pre-operative instructions:  She was instructed to not take Aspirin/NSAIDs x 7days prior to surgery.  Antibiotic prophylaxis was ordered as indicated. Milk of magnesia was ordered for bowel prep- she should take 1 bottle the day before surgery.   Post-operative instructions:  She was provided with specific post-operative instructions, including precautions and signs/symptoms for which we would recommend contacting us, in addition to daytime and after-hours contact phone numbers. This was provided on a handout.   Post-operative medications: Prescriptions for motrin, tylenol, miralax, milk of magnesia and oxycodone were sent to her pharmacy. Discussed using ibuprofen and tylenol on a schedule to limit use of narcotics.   Laboratory testing:  Day of surgery UPT   Preoperative clearance:  She does not require surgical clearance.    Post-operative follow-up:  A post-operative appointment will be made for 6 weeks from the date of surgery. If she needs a post-operative nurse visit for a voiding trial, that will be set up after she leaves the hospital.    Patient will call the clinic or use MyChart should anything change or any new issues arise.   Marguerita Beards, MD

## 2021-03-31 ENCOUNTER — Encounter: Payer: Self-pay | Admitting: Obstetrics and Gynecology

## 2021-03-31 ENCOUNTER — Ambulatory Visit (INDEPENDENT_AMBULATORY_CARE_PROVIDER_SITE_OTHER): Payer: Medicaid Other | Admitting: Obstetrics and Gynecology

## 2021-03-31 ENCOUNTER — Other Ambulatory Visit: Payer: Self-pay

## 2021-03-31 VITALS — BP 118/78 | HR 88

## 2021-03-31 DIAGNOSIS — Z01818 Encounter for other preprocedural examination: Secondary | ICD-10-CM

## 2021-03-31 MED ORDER — ACETAMINOPHEN 500 MG PO TABS: 500.0000 mg | ORAL_TABLET | Freq: Four times a day (QID) | ORAL | 0 refills | Status: DC | PRN

## 2021-03-31 MED ORDER — POLYETHYLENE GLYCOL 3350 17 GM/SCOOP PO POWD: 17.0000 g | Freq: Every day | ORAL | 0 refills | Status: DC

## 2021-03-31 MED ORDER — OXYCODONE HCL 5 MG PO TABS
5.0000 mg | ORAL_TABLET | ORAL | 0 refills | Status: DC | PRN
Start: 2021-03-31 — End: 2021-03-31

## 2021-03-31 MED ORDER — MILK OF MAGNESIA 7.75 % PO SUSP: 355.0000 mL | Freq: Once | ORAL | 0 refills | Status: AC

## 2021-03-31 MED ORDER — SITZ BATH MISC: 1.0000 | Freq: Every day | 0 refills | Status: DC

## 2021-03-31 MED ORDER — IBUPROFEN 600 MG PO TABS: 600.0000 mg | ORAL_TABLET | Freq: Four times a day (QID) | ORAL | 0 refills | Status: DC | PRN

## 2021-03-31 MED ORDER — MILK OF MAGNESIA 7.75 % PO SUSP: 355.0000 mL | Freq: Once | ORAL | 0 refills | Status: DC

## 2021-03-31 MED ORDER — OXYCODONE HCL 5 MG PO TABS
5.0000 mg | ORAL_TABLET | ORAL | 0 refills | Status: DC | PRN
Start: 2021-03-31 — End: 2021-04-27

## 2021-03-31 NOTE — H&P (Signed)
[facility] Pre-Operative H&P  Subjective Chief Complaint: Kelsey Mcdaniel presents for a preoperative encounter.   History of Present Illness: Kelsey Mcdaniel is a 25 y.o. female who presents for preoperative visit.  She is scheduled to undergo  on perineoplasty and sphincteroplasty.  Her symptoms include anal incontinence after vaginal delivery.   Past Medical History:  Diagnosis Date   Pregnancy induced hypertension      Past Surgical History:  Procedure Laterality Date   RECTOVAGINAL FISTULA CLOSURE  2021    has No Known Allergies.   Family History  Problem Relation Age of Onset   Heart disease Father     Social History   Tobacco Use   Smoking status: Never   Smokeless tobacco: Never  Vaping Use   Vaping Use: Never used  Substance Use Topics   Alcohol use: Never   Drug use: Never     Review of Systems was negative for a full 10 system review except as noted in the History of Present Illness.  No current facility-administered medications for this encounter.  Current Outpatient Medications:    acetaminophen (TYLENOL) 500 MG tablet, Take 1 tablet (500 mg total) by mouth every 6 (six) hours as needed (pain)., Disp: 30 tablet, Rfl: 0   ibuprofen (ADVIL) 600 MG tablet, Take 1 tablet (600 mg total) by mouth every 6 (six) hours as needed., Disp: 30 tablet, Rfl: 0   magnesium hydroxide (MILK OF MAGNESIA) 400 MG/5ML suspension, Take 355 mLs by mouth once for 1 dose. Take the day before surgery for bowel prep, Disp: 355 mL, Rfl: 0   Misc. Devices (SITZ BATH) MISC, 1 each by Does not apply route daily., Disp: 1 each, Rfl: 0   oxyCODONE (OXY IR/ROXICODONE) 5 MG immediate release tablet, Take 1 tablet (5 mg total) by mouth every 4 (four) hours as needed for severe pain., Disp: 10 tablet, Rfl: 0   polyethylene glycol powder (GLYCOLAX/MIRALAX) 17 GM/SCOOP powder, Take 17 g by mouth daily. Drink 17g (1 scoop) dissolved in water per day., Disp: 255 g, Rfl: 0   Prenatal  Vit-Fe Fumarate-FA (PRENATAL MULTIVITAMIN) TABS tablet, Take 1 tablet by mouth daily at 12 noon. (Patient not taking: No sig reported), Disp: 60 tablet, Rfl: 2   Objective There were no vitals filed for this visit.   Gen: NAD CV: S1 S2 RRR Lungs: Clear to auscultation bilaterally Abd: soft, nontender  Previous Pelvic Exam showed: Perineum is only a thin band of tissue with dovetail cloacal connection of rectum to vagina behind tissue band.  Speculum exam reveals normal vaginal mucosa without atrophy. Cervix normal appearance. Uterus normal single, nontender. Adnexa no mass, fullness, tenderness.     Pelvic floor strength III/V, puborectalis II/V external anal sphincter I/V   Rectal Exam:  Diminished sphincter tone, cloacal defect with thin band for perineal body. External anal sphincter palpable inferiorly but absent from about 10 o'clock to 2 o'clock. no distal rectocele.   Pelvic floor musculature: Right levator non-tender, Right obturator non-tender, Left levator non-tender, Left obturator non-tender    Assessment/ Plan  Assessment: The patient is a 25 y.o. year old scheduled to undergo perineoplasty and sphincteroplasty.   Marguerita Beards, MD

## 2021-04-06 ENCOUNTER — Encounter (HOSPITAL_BASED_OUTPATIENT_CLINIC_OR_DEPARTMENT_OTHER): Payer: Self-pay | Admitting: Obstetrics and Gynecology

## 2021-04-06 ENCOUNTER — Other Ambulatory Visit: Payer: Self-pay

## 2021-04-06 NOTE — Progress Notes (Signed)
Spoke w/ via phone for pre-op interview--- pt's husband, Kelsey Mcdaniel, thru [employer] interpreter [phone] Lab needs dos----   [employer] (gent orders)  , urinre preg         Lab results------ no COVID test -----patient states asymptomatic no test needed Arrive at ------- 1000 on 04-10-2021 NPO after MN  Med rec completed Medications to take morning of surgery ----- none Diabetic medication ----- n/a Patient instructed no nail polish to be worn day of surgery Patient instructed to bring photo id and insurance card day of surgery Patient aware to have Driver (ride ) / caregiver for 24 hours after surgery --husband Patient Special Instructions ----- n/a Pre-Op special Istructions ----- requested female Pashto interpreter via email to [employer] interpreting , copy of email w/ chart, requested confirmation.  Probable may have to have pt's husband in pre-op. Patient verbalized understanding of instructions that were given at this phone interview. Patient denies shortness of breath, chest pain, fever, cough at this phone interview.

## 2021-04-10 ENCOUNTER — Ambulatory Visit (HOSPITAL_BASED_OUTPATIENT_CLINIC_OR_DEPARTMENT_OTHER): Payer: Medicaid Other | Admitting: Anesthesiology

## 2021-04-10 ENCOUNTER — Encounter (HOSPITAL_BASED_OUTPATIENT_CLINIC_OR_DEPARTMENT_OTHER): Payer: Self-pay | Admitting: Obstetrics and Gynecology

## 2021-04-10 ENCOUNTER — Other Ambulatory Visit: Payer: Self-pay

## 2021-04-10 ENCOUNTER — Telehealth: Payer: Self-pay | Admitting: Obstetrics and Gynecology

## 2021-04-10 ENCOUNTER — Encounter (HOSPITAL_BASED_OUTPATIENT_CLINIC_OR_DEPARTMENT_OTHER)
Admission: RE | Disposition: A | Discharge status: Home / Self Care | Payer: Self-pay | Source: Home / Self Care | Attending: Obstetrics and Gynecology

## 2021-04-10 ENCOUNTER — Ambulatory Visit (HOSPITAL_BASED_OUTPATIENT_CLINIC_OR_DEPARTMENT_OTHER)
Admission: RE | Admit: 2021-04-10 | Discharge: 2021-04-10 | Disposition: A | Discharge status: Home / Self Care | Payer: Medicaid Other | Attending: Obstetrics and Gynecology | Admitting: Obstetrics and Gynecology

## 2021-04-10 DIAGNOSIS — Z01818 Encounter for other preprocedural examination: Secondary | ICD-10-CM

## 2021-04-10 DIAGNOSIS — R159 Full incontinence of feces: Secondary | ICD-10-CM | POA: Insufficient documentation

## 2021-04-10 DIAGNOSIS — N823 Fistula of vagina to large intestine: Secondary | ICD-10-CM

## 2021-04-10 DIAGNOSIS — Q437 Persistent cloaca: Secondary | ICD-10-CM | POA: Insufficient documentation

## 2021-04-10 HISTORY — DX: Personal history of other complications of pregnancy, childbirth and the puerperium: Z87.59

## 2021-04-10 HISTORY — PX: PERINEOPLASTY: SHX2218

## 2021-04-10 HISTORY — DX: Persistent cloaca: Q43.7

## 2021-04-10 LAB — POCT I-STAT, CHEM 8
BUN: 16 mg/dL (ref 6–20)
Calcium, Ion: 1.31 mmol/L (ref 1.15–1.40)
Chloride: 105 mmol/L (ref 98–111)
Creatinine, Ser: 0.6 mg/dL (ref 0.44–1.00)
Glucose, Bld: 86 mg/dL (ref 70–99)
HCT: 39 % (ref 36.0–46.0)
Hemoglobin: 13.3 g/dL (ref 12.0–15.0)
Potassium: 4.2 mmol/L (ref 3.5–5.1)
Sodium: 144 mmol/L (ref 135–145)
TCO2: 23 mmol/L (ref 22–32)

## 2021-04-10 LAB — POCT PREGNANCY, URINE: Preg Test, Ur: NEGATIVE

## 2021-04-10 SURGERY — PERINEOPLASTY
Anesthesia: General | Site: Rectum

## 2021-04-10 MED ORDER — FENTANYL CITRATE (PF) 100 MCG/2ML IJ SOLN
INTRAMUSCULAR | Status: AC
  Filled 2021-04-10: qty 2

## 2021-04-10 MED ORDER — 0.9 % SODIUM CHLORIDE (POUR BTL) OPTIME
TOPICAL | Status: DC | PRN
  Administered 2021-04-10: 500 mL

## 2021-04-10 MED ORDER — DEXAMETHASONE SODIUM PHOSPHATE 10 MG/ML IJ SOLN
INTRAMUSCULAR | Status: DC | PRN
  Administered 2021-04-10: 10 mg via INTRAVENOUS

## 2021-04-10 MED ORDER — PROPOFOL 10 MG/ML IV BOLUS
INTRAVENOUS | Status: DC | PRN
  Administered 2021-04-10: 200 mg via INTRAVENOUS
  Administered 2021-04-10: 50 mg via INTRAVENOUS
  Administered 2021-04-10: 60 mg via INTRAVENOUS

## 2021-04-10 MED ORDER — OXYCODONE HCL 5 MG/5ML PO SOLN: 5.0000 mg | Freq: Once | ORAL | Status: DC | PRN

## 2021-04-10 MED ORDER — POLYETHYLENE GLYCOL 3350 17 GM/SCOOP PO POWD: 17.0000 g | Freq: Every day | ORAL | 0 refills | Status: DC

## 2021-04-10 MED ORDER — FENTANYL CITRATE (PF) 100 MCG/2ML IJ SOLN
INTRAMUSCULAR | Status: DC | PRN
  Administered 2021-04-10 (×5): 50 ug via INTRAVENOUS

## 2021-04-10 MED ORDER — MIDAZOLAM HCL 5 MG/5ML IJ SOLN
INTRAMUSCULAR | Status: DC | PRN
  Administered 2021-04-10: 2 mg via INTRAVENOUS

## 2021-04-10 MED ORDER — PHENYLEPHRINE 40 MCG/ML (10ML) SYRINGE FOR IV PUSH (FOR BLOOD PRESSURE SUPPORT)
PREFILLED_SYRINGE | INTRAVENOUS | Status: DC | PRN
  Administered 2021-04-10: 80 ug via INTRAVENOUS

## 2021-04-10 MED ORDER — MIDAZOLAM HCL 2 MG/2ML IJ SOLN
INTRAMUSCULAR | Status: AC
  Filled 2021-04-10: qty 2

## 2021-04-10 MED ORDER — DROPERIDOL 2.5 MG/ML IJ SOLN
INTRAMUSCULAR | Status: DC | PRN
  Administered 2021-04-10: .625 mg via INTRAVENOUS

## 2021-04-10 MED ORDER — LACTATED RINGERS IV SOLN: INTRAVENOUS | Status: DC

## 2021-04-10 MED ORDER — OXYCODONE HCL 5 MG PO TABS
5.0000 mg | ORAL_TABLET | Freq: Once | ORAL | Status: DC | PRN
Start: 2021-04-10 — End: 2021-04-10

## 2021-04-10 MED ORDER — LIDOCAINE-EPINEPHRINE 1 %-1:100000 IJ SOLN
INTRAMUSCULAR | Status: DC | PRN
  Administered 2021-04-10: 6 mL

## 2021-04-10 MED ORDER — POVIDONE-IODINE 10 % EX SWAB: 2.0000 "application " | Freq: Once | CUTANEOUS | Status: DC

## 2021-04-10 MED ORDER — KETOROLAC TROMETHAMINE 30 MG/ML IJ SOLN
INTRAMUSCULAR | Status: AC
  Filled 2021-04-10: qty 1

## 2021-04-10 MED ORDER — PHENYLEPHRINE 40 MCG/ML (10ML) SYRINGE FOR IV PUSH (FOR BLOOD PRESSURE SUPPORT)
PREFILLED_SYRINGE | INTRAVENOUS | Status: AC
  Filled 2021-04-10: qty 10

## 2021-04-10 MED ORDER — ONDANSETRON HCL 4 MG/2ML IJ SOLN
INTRAMUSCULAR | Status: DC | PRN
Start: 2021-04-10 — End: 2021-04-10
  Administered 2021-04-10: 4 mg via INTRAVENOUS

## 2021-04-10 MED ORDER — METRONIDAZOLE 500 MG/100ML IV SOLN
INTRAVENOUS | Status: AC
  Filled 2021-04-10: qty 100

## 2021-04-10 MED ORDER — LIDOCAINE 2% (20 MG/ML) 5 ML SYRINGE
INTRAMUSCULAR | Status: DC | PRN
  Administered 2021-04-10: 100 mg via INTRAVENOUS

## 2021-04-10 MED ORDER — LIDOCAINE 2% (20 MG/ML) 5 ML SYRINGE
INTRAMUSCULAR | Status: AC
  Filled 2021-04-10: qty 5

## 2021-04-10 MED ORDER — ACETAMINOPHEN 10 MG/ML IV SOLN
INTRAVENOUS | Status: AC
  Filled 2021-04-10: qty 100

## 2021-04-10 MED ORDER — FENTANYL CITRATE (PF) 100 MCG/2ML IJ SOLN
25.0000 ug | INTRAMUSCULAR | Status: DC | PRN
  Administered 2021-04-10: 50 ug via INTRAVENOUS

## 2021-04-10 MED ORDER — ONDANSETRON HCL 4 MG/2ML IJ SOLN
INTRAMUSCULAR | Status: AC
  Filled 2021-04-10: qty 2

## 2021-04-10 MED ORDER — ACETAMINOPHEN 10 MG/ML IV SOLN
1000.0000 mg | Freq: Once | INTRAVENOUS | Status: AC
  Administered 2021-04-10: 1000 mg via INTRAVENOUS

## 2021-04-10 MED ORDER — KETOROLAC TROMETHAMINE 30 MG/ML IJ SOLN: 30.0000 mg | Freq: Once | INTRAMUSCULAR | Status: DC | PRN

## 2021-04-10 MED ORDER — DEXAMETHASONE SODIUM PHOSPHATE 10 MG/ML IJ SOLN
INTRAMUSCULAR | Status: AC
  Filled 2021-04-10: qty 1

## 2021-04-10 MED ORDER — KETOROLAC TROMETHAMINE 30 MG/ML IJ SOLN
INTRAMUSCULAR | Status: DC | PRN
  Administered 2021-04-10: 30 mg via INTRAVENOUS

## 2021-04-10 MED ORDER — METRONIDAZOLE 500 MG/100ML IV SOLN
500.0000 mg | INTRAVENOUS | Status: AC
  Administered 2021-04-10: 500 mg via INTRAVENOUS

## 2021-04-10 MED ORDER — GENTAMICIN SULFATE 40 MG/ML IJ SOLN
5.0000 mg/kg | INTRAVENOUS | Status: AC
  Administered 2021-04-10: 300 mg via INTRAVENOUS
  Filled 2021-04-10: qty 7.5

## 2021-04-10 MED ORDER — ONDANSETRON HCL 4 MG/2ML IJ SOLN: 4.0000 mg | Freq: Once | INTRAMUSCULAR | Status: DC | PRN

## 2021-04-10 MED ORDER — PROPOFOL 10 MG/ML IV BOLUS
INTRAVENOUS | Status: AC
  Filled 2021-04-10: qty 20

## 2021-04-10 SURGICAL SUPPLY — 36 items
BLADE SURG 15 STRL LF DISP TIS (BLADE) ×2 IMPLANT
BLADE SURG 15 STRL SS (BLADE) ×1
CATH ROBINSON RED A/P 14FR (CATHETERS) ×6 IMPLANT
CLEANER CAUTERY TIP 5X5 PAD (MISCELLANEOUS) ×2 IMPLANT
GAUZE 4X4 16PLY ~~LOC~~+RFID DBL (SPONGE) ×6 IMPLANT
GLOVE SURG ENC MOIS LTX SZ6 (GLOVE) ×18 IMPLANT
GLOVE SURG POLYISO LF SZ6.5 (GLOVE) ×3 IMPLANT
GLOVE SURG UNDER POLY LF SZ6 (GLOVE) ×6 IMPLANT
GLOVE SURG UNDER POLY LF SZ6.5 (GLOVE) ×3 IMPLANT
GLOVE SURG UNDER POLY LF SZ7 (GLOVE) ×3 IMPLANT
GLOVE SURG UNDER POLY LF SZ7.5 (GLOVE) ×3 IMPLANT
GOWN STRL REUS W/TWL LRG LVL3 (GOWN DISPOSABLE) ×15 IMPLANT
HIBICLENS CHG 4% 4OZ (MISCELLANEOUS) ×3 IMPLANT
KIT TURNOVER CYSTO (KITS) ×3 IMPLANT
MANIFOLD NEPTUNE II (INSTRUMENTS) ×3 IMPLANT
NEEDLE HYPO 22GX1.5 SAFETY (NEEDLE) ×3 IMPLANT
NS IRRIG 500ML POUR BTL (IV SOLUTION) ×3 IMPLANT
PACK VAGINAL WOMENS (CUSTOM PROCEDURE TRAY) ×3 IMPLANT
PAD CLEANER CAUTERY TIP 5X5 (MISCELLANEOUS) ×1
PDS 0 CT-2 ×6 IMPLANT
PENCIL SMOKE EVACUATOR (MISCELLANEOUS) ×3 IMPLANT
RETRACTOR LONE STAR DISPOSABLE (INSTRUMENTS) ×3 IMPLANT
RETRACTOR STAY HOOK 5MM (MISCELLANEOUS) ×12 IMPLANT
SUCTION FRAZIER HANDLE 10FR (MISCELLANEOUS) ×1
SUCTION TUBE FRAZIER 10FR DISP (MISCELLANEOUS) ×2 IMPLANT
SUT PDS PLUS 0 (SUTURE) ×2
SUT PDS PLUS AB 0 CT-2 (SUTURE) ×4 IMPLANT
SUT VIC AB 0 CT1 27 (SUTURE) ×1
SUT VIC AB 0 CT1 27XBRD ANBCTR (SUTURE) ×2 IMPLANT
SUT VIC AB 3-0 SH 27 (SUTURE) ×2
SUT VIC AB 3-0 SH 27X BRD (SUTURE) ×4 IMPLANT
SUT VICRYL 2-0 SH 8X27 (SUTURE) ×3 IMPLANT
SYR BULB EAR ULCER 3OZ GRN STR (SYRINGE) ×3 IMPLANT
TOWEL OR 17X26 10 PK STRL BLUE (TOWEL DISPOSABLE) ×3 IMPLANT
TRAY DSU PREP LF (CUSTOM PROCEDURE TRAY) ×3 IMPLANT
TUBE CONNECTING 12X1/4 (SUCTIONS) ×3 IMPLANT

## 2021-04-10 NOTE — Anesthesia Postprocedure Evaluation (Signed)
Anesthesia Post Note  Patient: Kelsey Mcdaniel  Procedure(s) Performed: PERINEOPLASTY (Perineum) Anal Sphincteroplasty (Rectum)     Patient location during evaluation: PACU Anesthesia Type: General Level of consciousness: awake and alert Pain management: pain level controlled Vital Signs Assessment: post-procedure vital signs reviewed and stable Respiratory status: spontaneous breathing, nonlabored ventilation, respiratory function stable and patient connected to nasal cannula oxygen Cardiovascular status: blood pressure returned to baseline and stable Postop Assessment: no apparent nausea or vomiting Anesthetic complications: no   No notable events documented.  Last Vitals:  Vitals:   04/10/21 1309 04/10/21 1315  BP: 121/70   Pulse: 99 93  Resp: (!) 21   Temp: 36.5 C   SpO2: 99%     Last Pain:  Vitals:   04/10/21 1309  TempSrc:   PainSc: Asleep                 Rosa Wyly S

## 2021-04-10 NOTE — Telephone Encounter (Signed)
Kelsey Mcdaniel underwent perineoplasty and anal sphincteroplasty on 04/10/21.   She did not have a voiding trial. Please call her for a routine post op check. Thanks!  Marguerita Beards, MD

## 2021-04-10 NOTE — Interval H&P Note (Signed)
History and Physical Interval Note:  04/10/2021 11:03 AM  Kelsey Mcdaniel  has presented today for surgery, with the diagnosis of cloacal malformation.  The various methods of treatment have been discussed with the patient and family. After consideration of risks, benefits and other options for treatment, the patient has consented to  Procedure(s) with comments: PERINEOPLASTY (N/A) - Anal Sphincteroplasty (N/A) as a surgical intervention.    The patient's history has been reviewed, patient examined, no change in status, stable for surgery.  I have reviewed the patient's chart and labs.  Questions were answered to the patient's satisfaction.     Marguerita Beards

## 2021-04-10 NOTE — Anesthesia Procedure Notes (Signed)
Procedure Name: LMA Insertion Date/Time: 04/10/2021 11:43 AM Performed by: Bishop Limbo, CRNA Pre-anesthesia Checklist: Patient identified, Emergency Drugs available, Suction available and Patient being monitored Patient Re-evaluated:Patient Re-evaluated prior to induction Oxygen Delivery Method: Circle System Utilized Preoxygenation: Pre-oxygenation with 100% oxygen Induction Type: IV induction Ventilation: Mask ventilation without difficulty LMA: LMA inserted LMA Size: 4.0 Number of attempts: 1 Airway Equipment and Method: Bite block Placement Confirmation: positive ETCO2 Tube secured with: Tape Dental Injury: Teeth and Oropharynx as per pre-operative assessment

## 2021-04-10 NOTE — Transfer of Care (Signed)
Immediate Anesthesia Transfer of Care Note  Patient: Kelsey Mcdaniel  Procedure(s) Performed: PERINEOPLASTY (Perineum) Anal Sphincteroplasty (Rectum)  Patient Location: PACU  Anesthesia Type:General  Level of Consciousness: awake, alert , oriented and patient cooperative  Airway & Oxygen Therapy: Patient Spontanous Breathing  Post-op Assessment: Report given to RN and Post -op Vital signs reviewed and stable  Post vital signs: Reviewed and stable  Last Vitals:  Vitals Value Taken Time  BP 121/70 04/10/21 1308  Temp    Pulse 90 04/10/21 1313  Resp 16 04/10/21 1313  SpO2 99 % 04/10/21 1313  Vitals shown include unvalidated device data.  Last Pain:  Vitals:   04/10/21 1109  TempSrc: Oral  PainSc: 0-No pain      Patients Stated Pain Goal: 4 (04/10/21 1109)  Complications: No notable events documented.

## 2021-04-10 NOTE — Discharge Instructions (Addendum)
POST OPERATIVE INSTRUCTIONS  General Instructions Recovery (not bed rest) will last approximately 6 weeks Walking is encouraged, but refrain from strenuous exercise/ housework/ heavy lifting. No lifting >10lbs  Nothing in the vagina- NO intercourse, tampons or douching Bathing:  Do not submerge in water (NO swimming, bath, hot tub, etc) until after your postop visit. You can shower starting the day after surgery.  No driving until you are not taking narcotic pain medicine and until your pain is well enough controlled that you can slam on the breaks or make sudden movements if needed.  Place ice on the perineum for 20 min at a time (with at least 1 hr break) for the first 48 hours  Taking your medications Please take your acetaminophen and ibuprofen on a schedule for the first 48 hours. Take 600mg  ibuprofen, then take 500mg  acetaminophen 3 hours later, then continue to alternate ibuprofen and acetaminophen. That way you are taking each type of medication every 6 hours. Take the prescribed narcotic (oxycodone, tramadol, etc) as needed, with a maximum being every 4 hours.  Take a stool softener daily to keep your stools soft and preventing you from straining. We have prescribed you Miralax- take one cap every day.  Reasons to Call the Nurse (see last page for phone numbers) Heavy Bleeding (changing your pad every 1-2 hours) Persistent nausea/vomiting Fever (100.4 degrees or more) Incision problems (pus or other fluid coming out, redness, warmth, increased pain)  Things to Expect After Surgery Mild to Moderate pain is normal during the first day or two after surgery. If prescribed, take Ibuprofen or Tylenol first and use the stronger medicine for "break-through" pain. You can overlap these medicines because they work differently.   Constipation   To Prevent Constipation:  Eat a well-balanced diet including protein, grains, fresh fruit and vegetables.  Drink plenty of fluids. Walk regularly.   Depending on specific instructions from your physician: take Miralax daily and additionally you can add a stool softener (colace/ docusate) and fiber supplement. Continue until your post-op visit  To Treat Constipation:  If you do not have a bowel movement in 2 days after surgery, you can take 2 Tbs of Milk of Magnesia 1-2 times a day until you have a bowel movement. If diarrhea occurs, decrease the amount or stop the laxative. If no results with Milk of Magnesia, you can drink a bottle of magnesium citrate which you can purchase over the counter.  Fatigue:  This is a normal response to surgery and will improve with time.  Plan frequent rest periods throughout the day.  Gas Pain:  This is very common but can also be very painful! Drink warm liquids such as herbal teas, bouillon or soup. Walking will help you pass more gas.  Mylicon or Gas-X can be taken over the counter.    Post op concerns  For non-emergent issues, please call the Urogynecology Nurse. Please leave a message and someone will contact you within one business day.  You can also send a message through MyChart.   AFTER HOURS (After 5:00 PM and on weekends):  For urgent matters that cannot wait until the next business day. Call our office [phone] and connect to the doctor on call.  Please reserve this for important issues.   **FOR ANY TRUE EMERGENCY ISSUES CALL 911 OR GO TO THE NEAREST EMERGENCY ROOM.** Please inform our office or the doctor on call of any emergency.     APPOINTMENTS: Call [phone]   Post Anesthesia Home Care  Instructions  Activity: Get plenty of rest for the remainder of the day. A responsible individual must stay with you for 24 hours following the procedure.  For the next 24 hours, DO NOT: -Drive a car -Advertising copywriter -Drink alcoholic beverages -Take any medication unless instructed by your physician -Make any legal decisions or sign important papers.  Meals: Start with liquid foods such  as gelatin or soup. Progress to regular foods as tolerated. Avoid greasy, spicy, heavy foods. If nausea and/or vomiting occur, drink only clear liquids until the nausea and/or vomiting subsides. Call your physician if vomiting continues.  Special Instructions/Symptoms: Your throat may feel dry or sore from the anesthesia or the breathing tube placed in your throat during surgery. If this causes discomfort, gargle with warm salt water. The discomfort should disappear within 24 hours.   **No ibuprofen, Advil, Aleve, Motrin, ketorolac, meloxicam, or naproxen until after 7pm today if needed.

## 2021-04-10 NOTE — Anesthesia Preprocedure Evaluation (Signed)
Anesthesia Evaluation  Patient identified by MRN, date of birth, ID band Patient awake    Reviewed: Allergy & Precautions, NPO status , Patient's Chart, lab work & pertinent test results  Airway Mallampati: II  TM Distance: >3 FB Neck ROM: Full    Dental no notable dental hx.    Pulmonary neg pulmonary ROS,    Pulmonary exam normal breath sounds clear to auscultation       Cardiovascular negative cardio ROS Normal cardiovascular exam Rhythm:Regular Rate:Normal     Neuro/Psych negative neurological ROS  negative psych ROS   GI/Hepatic negative GI ROS, Neg liver ROS,   Endo/Other  negative endocrine ROS  Renal/GU negative Renal ROS  negative genitourinary   Musculoskeletal negative musculoskeletal ROS (+)   Abdominal   Peds negative pediatric ROS (+)  Hematology negative hematology ROS (+)   Anesthesia Other Findings   Reproductive/Obstetrics negative OB ROS                             Anesthesia Physical Anesthesia Plan  ASA: 1  Anesthesia Plan: General   Post-op Pain Management:    Induction: Intravenous  PONV Risk Score and Plan: 3 and Ondansetron, Dexamethasone, Droperidol and Treatment may vary due to age or medical condition  Airway Management Planned: LMA  Additional Equipment:   Intra-op Plan:   Post-operative Plan: Extubation in OR  Informed Consent: I have reviewed the patients History and Physical, chart, labs and discussed the procedure including the risks, benefits and alternatives for the proposed anesthesia with the patient or authorized representative who has indicated his/her understanding and acceptance.     Dental advisory given  Plan Discussed with: CRNA and Surgeon  Anesthesia Plan Comments:         Anesthesia Quick Evaluation

## 2021-04-10 NOTE — Op Note (Signed)
Operative Note  Preoperative Diagnosis:  Cloacal malformation  Postoperative Diagnosis: same  Procedures performed:  Anal sphincteroplasty and perineoplasty  Implants: * No implants in log *  Attending Surgeon: Lanetta Inch, MD   Anesthesia: General LMA  Findings: On vaginal exam, a cloaca was noted with a thin band of tissue as the perineum. Poor rectal tone was noted.      Specimens: * No specimens in log *  Estimated blood loss: 150 mL  IV fluids: 700 mL  Urine output: 105 mL  Complications: none  Procedure in Detail: After informed consent was obtained patient was taken to the operating room where general anesthesia was induced and found to be adequate.  She was placed in dorsal lithotomy position taking care to avoid any nerve injury and prepped and draped in the usual sterile fashion.  A rectal exam was performed and there was noted to be absence of the perineal body with a thin band of floating tissue present across the area of the rectum.  This band was cut.  Allis clamps were placed along the anal verge.  1% lidocaine with epinephrine was injected into the rectal and posterior vaginal mucosa.  15 blade scalpel was used to make an incision along the anal verge and vaginal mucosa.  Allis clamps were placed on the edge of the incision and dissection was performed with thorek scissors in order to separate the rectal mucosa from the posterior vaginal wall.  Dissection was performed in order to further identify the external anal sphincter.  The right side anal sphincter was grasped and dissection was performed to elongate and loosen its attachments.  This was repeated on the left side.  A rectal exam was performed which showed entry into the rectal lumen.  This was repaired with a 3-0 Vicryl suture in a running fashion.  Repeat rectal exam performed integrity of the rectal mucosa.  A 2-0 Vicryl suture was then used to plicate the internal anal sphincter.  The right proximal and  of the external anal sphincter was attached to the distal end of the left external anal sphincter and this was repeated with 2 additional sutures so the last suture of the distal right sphincter was attached to the proximal left sphincter in an overlapping fashion.  Rectal exam was again performed which showed good sphincter tone.  Perineal body was then reapproximated with a 0 Vicryl suture in an interrupted fashion in 3 layers.  The perineal skin was then reapproximated with 2-0 Vicryl suture in interrupted fashion.  Good hemostasis was noted.  She tolerated the procedure well was taken recovery room in stable condition. Needle and sponge counts were correct x2.  Marguerita Beards, MD

## 2021-04-11 ENCOUNTER — Encounter (HOSPITAL_BASED_OUTPATIENT_CLINIC_OR_DEPARTMENT_OTHER): Payer: Self-pay | Admitting: Obstetrics and Gynecology

## 2021-04-11 ENCOUNTER — Encounter (HOSPITAL_BASED_OUTPATIENT_CLINIC_OR_DEPARTMENT_OTHER): Payer: Self-pay | Admitting: *Deleted

## 2021-04-11 NOTE — Telephone Encounter (Signed)
Post- Op Call  Kelsey Mcdaniel underwent perineoplasty and anal sphincteroplasty on 04/10/21 with Dr Florian Buff. The patient reports that her pain is not controlled. She is taking oxycodone and ibuprofen but not regularly. She reports minimal vaginal bleeding. She has not had a bowel movement and is taking miralax for a bowel regimen. She was discharged without a catheter.  Advised pt to take Tylenol and Ibuprofen alternating regularly until pain is better managed. Advised she could also take oxycodone for more severe pain. Pt has been taking either ibuprofen or oxy every 10-12 hours. Advised to use ice to perineum for 2 days. Pt asks if it is ok for her to use dermaplast spray to help with the initial burning she is having when she urinates. I advised that was ok as well. Advised that we will call to check on pt tomorrow as they are unable to get in contact with our office d/t language barrier. Pts husband would like for  [place] to call when he is on break from work between 03-1029 or 1-130. Advised that we would do our best. [employer] interpreter # [phone] Pashto interpreter used.  Harrie Jeans, RN

## 2021-04-12 ENCOUNTER — Ambulatory Visit: Payer: Medicaid Other | Admitting: Physical Therapy

## 2021-04-13 NOTE — Telephone Encounter (Signed)
Called pt to see how she is feeling post op. [employer]preter # [phone] used for interpretation. Pt is still having increased pain and reports some bleeding with bowel movements. Pt given appt to see Dr. Florian Buff either today or tomorrow depending on when husband can bring her in for evaluation.

## 2021-04-14 ENCOUNTER — Telehealth: Payer: Self-pay

## 2021-04-14 NOTE — Telephone Encounter (Signed)
Post- Op Call  Kelsey Mcdaniel underwent perineoplasty and anal sphincteroplasty  on 04/10/2021 with Dr Florian Buff. The patient reports that her pain is controlled. She is taking Oxycodone. She denies vaginal bleeding. She has had a bowel movement and is taking Miralax for a bowel regimen. She was discharged without a catheter and will return on 04/20/21 @ 2pm for a f/u/  Salina April, CMA

## 2021-04-19 NOTE — Progress Notes (Signed)
[facility]  Date of Visit: 04/20/2021  History of Present Illness: Ms. Kelsey Mcdaniel is a 25 y.o. female scheduled today for a post-operative visit.   Surgery: s/p Anal sphincteroplasty and perineoplasty on 04/10/21  Last week had lots of pain but this improved. No longer taking oxycodone, only ibuprofen. Has been doing the sitz bath daily. Has been making sure she is not doing too much activity. Noticed some vaginal bleeding. Also having some gas come upwards to vagina. Taking miralax 1-2 times per day.    Medications: She has a current medication list which includes the following prescription(s): ibuprofen, polyethylene glycol powder, acetaminophen, cephalexin, ibuprofen, sitz bath, oxycodone, polyethylene glycol powder, and PRESCRIPTION MEDICATION.   Allergies: Patient has No Known Allergies.   Physical Exam: BP 113/75   Pulse 99   LMP  (LMP Unknown) Comment: patient hasn't had period in 5 months   Pelvic Examination: Vagina: External and deep perineal sutures disrupted. Right side appears swollen, yellow discharge noted. Sutures in posterior vagina at introitus are intact. Rectal exam performed and external anal sphincter is intact.   ---------------------------------------------------------  Assessment and Plan:  1. Infection of superficial incisional surgical site after procedure, initial encounter   2. Wound disruption, post-op, skin, initial encounter     - Appears to have a superficial infection with wound disruption of the perineum. Will start keflex 4 times daily for 1 week.  - Discussed returning to the OR again to reapproximate the tissue rather than heal by secondary intention. She is agreeable to this. We reviewed that the sphincter is still intact so she would only need repair of the perineum.  - refill of miralax and ibuprofen also provided.   All questions answered with Pashto interpreter.   Marguerita Beards, MD

## 2021-04-20 ENCOUNTER — Other Ambulatory Visit: Payer: Self-pay

## 2021-04-20 ENCOUNTER — Ambulatory Visit (INDEPENDENT_AMBULATORY_CARE_PROVIDER_SITE_OTHER): Payer: Medicaid Other | Admitting: Obstetrics and Gynecology

## 2021-04-20 ENCOUNTER — Encounter: Payer: Medicaid Other | Admitting: Physical Therapy

## 2021-04-20 ENCOUNTER — Encounter: Payer: Self-pay | Admitting: Obstetrics and Gynecology

## 2021-04-20 VITALS — BP 113/75 | HR 99

## 2021-04-20 DIAGNOSIS — T8131XA Disruption of external operation (surgical) wound, not elsewhere classified, initial encounter: Secondary | ICD-10-CM

## 2021-04-20 DIAGNOSIS — T8141XA Infection following a procedure, superficial incisional surgical site, initial encounter: Secondary | ICD-10-CM

## 2021-04-20 MED ORDER — CEPHALEXIN 250 MG PO CAPS: 250.0000 mg | ORAL_CAPSULE | Freq: Four times a day (QID) | ORAL | 0 refills | Status: DC

## 2021-04-20 MED ORDER — IBUPROFEN 600 MG PO TABS: 600.0000 mg | ORAL_TABLET | Freq: Four times a day (QID) | ORAL | 1 refills | Status: DC | PRN

## 2021-04-20 MED ORDER — POLYETHYLENE GLYCOL 3350 17 GM/SCOOP PO POWD: ORAL | 0 refills | Status: DC

## 2021-04-21 NOTE — H&P (Signed)
[facility] Pre-Operative H&P  Subjective Chief Complaint: Kelsey Mcdaniel presents for a preoperative encounter.   History of Present Illness: Kelsey Mcdaniel is a 25 y.o. female who is s/p perineoplasty and sphincteroplasty on 10/17.  She presented to the office on 10/27 and was noted to have superficial wound breakdown of the perineum. The anal sphincter and vaginal sutures were intact.   Past Medical History:  Diagnosis Date   Cloacal malformation    History of pregnancy induced hypertension      Past Surgical History:  Procedure Laterality Date   PERINEOPLASTY N/A 04/10/2021   Procedure: PERINEOPLASTY;  Surgeon: Marguerita Beards, MD;  Location: [facility];  Service: Gynecology;  Laterality: N/A;   RECTOVAGINAL FISTULA CLOSURE  2021    has No Known Allergies.   Family History  Problem Relation Age of Onset   Heart disease Father     Social History   Tobacco Use   Smoking status: Never   Smokeless tobacco: Never  Vaping Use   Vaping Use: Never used  Substance Use Topics   Alcohol use: Never   Drug use: Never     Review of Systems was negative for a full 10 system review except as noted in the History of Present Illness.  No current facility-administered medications for this encounter.  Current Outpatient Medications:    acetaminophen (TYLENOL) 500 MG tablet, Take 1 tablet (500 mg total) by mouth every 6 (six) hours as needed (pain). (Patient not taking: Reported on 04/04/2021), Disp: 30 tablet, Rfl: 0   cephALEXin (KEFLEX) 250 MG capsule, Take 1 capsule (250 mg total) by mouth 4 (four) times daily for 7 days., Disp: 28 capsule, Rfl: 0   ibuprofen (ADVIL) 600 MG tablet, Take 1 tablet (600 mg total) by mouth every 6 (six) hours as needed. (Patient not taking: Reported on 04/04/2021), Disp: 30 tablet, Rfl: 0   ibuprofen (ADVIL) 600 MG tablet, Take 1 tablet (600 mg total) by mouth every 6 (six) hours as needed., Disp: 60 tablet, Rfl: 1    Misc. Devices (SITZ BATH) MISC, 1 each by Does not apply route daily., Disp: 1 each, Rfl: 0   oxyCODONE (OXY IR/ROXICODONE) 5 MG immediate release tablet, Take 1 tablet (5 mg total) by mouth every 4 (four) hours as needed for severe pain. (Patient not taking: Reported on 04/04/2021), Disp: 10 tablet, Rfl: 0   polyethylene glycol powder (GLYCOLAX/MIRALAX) 17 GM/SCOOP powder, Take 17 g by mouth daily. Drink 17g (1 scoop) dissolved in water per day., Disp: 255 g, Rfl: 0   polyethylene glycol powder (GLYCOLAX/MIRALAX) 17 GM/SCOOP powder, Drink 17g (1 scoop) dissolved in water 1-2 times per day, Disp: 578 g, Rfl: 0   PRESCRIPTION MEDICATION, Take 1 tablet by mouth at bedtime. Birth control pill,  does not know name, Disp: , Rfl:    Objective There were no vitals filed for this visit.   Gen: NAD CV: S1 S2 RRR Lungs: Clear to auscultation bilaterally Abd: soft, nontender  Pelvic Examination:  External and deep perineal sutures disrupted. Right side appears swollen, yellow discharge noted. Sutures in posterior vagina at introitus are intact. Rectal exam performed and external anal sphincter is intact.     Assessment/ Plan  Assessment: The patient is a 25 y.o. year old with wound breakdown scheduled to undergo repair of perineum   Marguerita Beards, MD

## 2021-04-24 ENCOUNTER — Other Ambulatory Visit: Payer: Self-pay | Admitting: Obstetrics and Gynecology

## 2021-04-24 ENCOUNTER — Encounter: Payer: Medicaid Other | Admitting: Obstetrics and Gynecology

## 2021-04-24 DIAGNOSIS — T8141XA Infection following a procedure, superficial incisional surgical site, initial encounter: Secondary | ICD-10-CM

## 2021-04-24 MED ORDER — CEPHALEXIN 250 MG PO CAPS: 250.0000 mg | ORAL_CAPSULE | Freq: Four times a day (QID) | ORAL | 0 refills | Status: DC

## 2021-04-24 NOTE — Progress Notes (Signed)
Patient called stating she is out of antibiotics. Has been taking keflex every four hours instead of 4x per day (every 6 hours). Nurse advised on correct time schedule. New prescription given for 3 additional days.

## 2021-04-25 ENCOUNTER — Encounter (HOSPITAL_BASED_OUTPATIENT_CLINIC_OR_DEPARTMENT_OTHER): Payer: Self-pay | Admitting: Obstetrics and Gynecology

## 2021-04-25 ENCOUNTER — Other Ambulatory Visit: Payer: Self-pay

## 2021-04-25 NOTE — Progress Notes (Addendum)
ADDENDUM:  Received call from pt's and husband's sponsor team, Gabriel Carina, inquiring about arrival time and discharge time since currently pt's husband is working and trying to get the day off.  She stated that someone from the sponsor team will dropped her and and pick her up after discharge will the responsible person, pt to have name and phone number when check-in dos.  Also , pt can add this person to hippa dos if pt request's it. Received confirmation for interpreter dos , one is available for post up , but not for pre-op will need to use ipad .   Spoke w/ via phone for pre-op interview--- pt's husband, Richrd Prime, thru [employer] interpreter ID# [phone] Lab needs dos----   istat (gent orders)  , urinre preg         Lab results------ no COVID test -----patient states asymptomatic no test needed Arrive at ------- 1215 on 04-25-2021 NPO after MN  Med rec completed Medications to take morning of surgery ----- none Diabetic medication ----- n/a Patient instructed no nail polish to be worn day of surgery Patient instructed to bring photo id and insurance card day of surgery Patient aware to have Driver (ride ) / caregiver for 24 hours after surgery --husband Patient Special Instructions ----- n/a Pre-Op special Istructions ----- requested female Pashto interpreter via email to  interpreting , copy of email w/ chart, requested confirmation.  Probable may have to have pt's husband in pre-op. Pt's husband stated when his wife had surgery last time @[facility]  04-10-2021 the interpreter was 04-12-2021 and difficult to understand.  I have let interpreting services aware of this but I did tell husband if happens to let nurse know and they can get an interpreter via video on ipad , stated he understood. Patient verbalized understanding of instructions that were given at this phone interview. Patient denies shortness of breath, chest pain, fever, cough at this phone interview.

## 2021-04-27 ENCOUNTER — Ambulatory Visit (HOSPITAL_BASED_OUTPATIENT_CLINIC_OR_DEPARTMENT_OTHER): Payer: Medicaid Other | Admitting: Anesthesiology

## 2021-04-27 ENCOUNTER — Ambulatory Visit (HOSPITAL_BASED_OUTPATIENT_CLINIC_OR_DEPARTMENT_OTHER)
Admission: RE | Admit: 2021-04-27 | Discharge: 2021-04-27 | Disposition: A | Discharge status: Home / Self Care | Payer: Medicaid Other | Attending: Obstetrics and Gynecology | Admitting: Obstetrics and Gynecology

## 2021-04-27 ENCOUNTER — Encounter (HOSPITAL_BASED_OUTPATIENT_CLINIC_OR_DEPARTMENT_OTHER): Payer: Self-pay | Admitting: Obstetrics and Gynecology

## 2021-04-27 ENCOUNTER — Encounter (HOSPITAL_BASED_OUTPATIENT_CLINIC_OR_DEPARTMENT_OTHER)
Admission: RE | Disposition: A | Discharge status: Home / Self Care | Payer: Self-pay | Source: Home / Self Care | Attending: Obstetrics and Gynecology

## 2021-04-27 ENCOUNTER — Other Ambulatory Visit: Payer: Self-pay | Admitting: Obstetrics and Gynecology

## 2021-04-27 DIAGNOSIS — T8131XD Disruption of external operation (surgical) wound, not elsewhere classified, subsequent encounter: Secondary | ICD-10-CM

## 2021-04-27 DIAGNOSIS — L7682 Other postprocedural complications of skin and subcutaneous tissue: Secondary | ICD-10-CM | POA: Diagnosis not present

## 2021-04-27 DIAGNOSIS — T8131XA Disruption of external operation (surgical) wound, not elsewhere classified, initial encounter: Secondary | ICD-10-CM | POA: Diagnosis not present

## 2021-04-27 DIAGNOSIS — Y838 Other surgical procedures as the cause of abnormal reaction of the patient, or of later complication, without mention of misadventure at the time of the procedure: Secondary | ICD-10-CM | POA: Insufficient documentation

## 2021-04-27 DIAGNOSIS — Z01818 Encounter for other preprocedural examination: Secondary | ICD-10-CM

## 2021-04-27 HISTORY — PX: PERINEOPLASTY: SHX2218

## 2021-04-27 LAB — POCT I-STAT, CHEM 8
BUN: 15 mg/dL (ref 6–20)
Calcium, Ion: 1.26 mmol/L (ref 1.15–1.40)
Chloride: 107 mmol/L (ref 98–111)
Creatinine, Ser: 0.5 mg/dL (ref 0.44–1.00)
Glucose, Bld: 88 mg/dL (ref 70–99)
HCT: 36 % (ref 36.0–46.0)
Hemoglobin: 12.2 g/dL (ref 12.0–15.0)
Potassium: 4 mmol/L (ref 3.5–5.1)
Sodium: 141 mmol/L (ref 135–145)
TCO2: 22 mmol/L (ref 22–32)

## 2021-04-27 LAB — POCT PREGNANCY, URINE: Preg Test, Ur: NEGATIVE

## 2021-04-27 SURGERY — PERINEOPLASTY
Anesthesia: General | Site: Vagina

## 2021-04-27 MED ORDER — POVIDONE-IODINE 10 % EX SWAB: 2.0000 "application " | Freq: Once | CUTANEOUS | Status: DC

## 2021-04-27 MED ORDER — PROMETHAZINE HCL 25 MG/ML IJ SOLN: 6.2500 mg | INTRAMUSCULAR | Status: DC | PRN

## 2021-04-27 MED ORDER — AMISULPRIDE (ANTIEMETIC) 5 MG/2ML IV SOLN: 10.0000 mg | Freq: Once | INTRAVENOUS | Status: DC | PRN

## 2021-04-27 MED ORDER — FENTANYL CITRATE (PF) 100 MCG/2ML IJ SOLN
INTRAMUSCULAR | Status: AC
  Filled 2021-04-27: qty 2

## 2021-04-27 MED ORDER — FENTANYL CITRATE (PF) 100 MCG/2ML IJ SOLN
INTRAMUSCULAR | Status: DC | PRN
  Administered 2021-04-27: 50 ug via INTRAVENOUS
  Administered 2021-04-27 (×2): 25 ug via INTRAVENOUS

## 2021-04-27 MED ORDER — 0.9 % SODIUM CHLORIDE (POUR BTL) OPTIME
TOPICAL | Status: DC | PRN
  Administered 2021-04-27: 500 mL

## 2021-04-27 MED ORDER — SCOPOLAMINE 1 MG/3DAYS TD PT72
1.0000 | MEDICATED_PATCH | TRANSDERMAL | Status: DC
  Administered 2021-04-27: 1.5 mg via TRANSDERMAL

## 2021-04-27 MED ORDER — LIDOCAINE 2% (20 MG/ML) 5 ML SYRINGE
INTRAMUSCULAR | Status: AC
  Filled 2021-04-27: qty 5

## 2021-04-27 MED ORDER — MIDAZOLAM HCL 5 MG/5ML IJ SOLN
INTRAMUSCULAR | Status: DC | PRN
  Administered 2021-04-27: 2 mg via INTRAVENOUS

## 2021-04-27 MED ORDER — DEXAMETHASONE SODIUM PHOSPHATE 10 MG/ML IJ SOLN
INTRAMUSCULAR | Status: AC
  Filled 2021-04-27: qty 1

## 2021-04-27 MED ORDER — LIDOCAINE-EPINEPHRINE 1 %-1:100000 IJ SOLN
INTRAMUSCULAR | Status: DC | PRN
  Administered 2021-04-27: 2 mL

## 2021-04-27 MED ORDER — OXYCODONE HCL 5 MG PO TABS
5.0000 mg | ORAL_TABLET | Freq: Once | ORAL | Status: AC | PRN
  Administered 2021-04-27: 5 mg via ORAL

## 2021-04-27 MED ORDER — OXYCODONE HCL 5 MG PO TABS: 5.0000 mg | ORAL_TABLET | ORAL | 0 refills | Status: DC | PRN

## 2021-04-27 MED ORDER — ONDANSETRON HCL 4 MG/2ML IJ SOLN
INTRAMUSCULAR | Status: AC
  Filled 2021-04-27: qty 2

## 2021-04-27 MED ORDER — FENTANYL CITRATE (PF) 100 MCG/2ML IJ SOLN
25.0000 ug | INTRAMUSCULAR | Status: DC | PRN
  Administered 2021-04-27: 50 ug via INTRAVENOUS

## 2021-04-27 MED ORDER — OXYCODONE HCL 5 MG/5ML PO SOLN: 5.0000 mg | Freq: Once | ORAL | Status: AC | PRN

## 2021-04-27 MED ORDER — MIDAZOLAM HCL 2 MG/2ML IJ SOLN
INTRAMUSCULAR | Status: AC
  Filled 2021-04-27: qty 2

## 2021-04-27 MED ORDER — CEFAZOLIN SODIUM-DEXTROSE 2-4 GM/100ML-% IV SOLN
2.0000 g | INTRAVENOUS | Status: AC
  Administered 2021-04-27: 2 g via INTRAVENOUS

## 2021-04-27 MED ORDER — SCOPOLAMINE 1 MG/3DAYS TD PT72
MEDICATED_PATCH | TRANSDERMAL | Status: AC
  Filled 2021-04-27: qty 1

## 2021-04-27 MED ORDER — ACETAMINOPHEN 500 MG PO TABS
1000.0000 mg | ORAL_TABLET | Freq: Once | ORAL | Status: AC
  Administered 2021-04-27: 1000 mg via ORAL

## 2021-04-27 MED ORDER — KETOROLAC TROMETHAMINE 30 MG/ML IJ SOLN
INTRAMUSCULAR | Status: AC
  Filled 2021-04-27: qty 1

## 2021-04-27 MED ORDER — PROPOFOL 10 MG/ML IV BOLUS
INTRAVENOUS | Status: DC | PRN
  Administered 2021-04-27: 170 mg via INTRAVENOUS

## 2021-04-27 MED ORDER — DEXAMETHASONE SODIUM PHOSPHATE 10 MG/ML IJ SOLN
INTRAMUSCULAR | Status: DC | PRN
  Administered 2021-04-27: 5 mg via INTRAVENOUS

## 2021-04-27 MED ORDER — KETOROLAC TROMETHAMINE 30 MG/ML IJ SOLN
INTRAMUSCULAR | Status: DC | PRN
  Administered 2021-04-27: 30 mg via INTRAVENOUS

## 2021-04-27 MED ORDER — ACETAMINOPHEN 500 MG PO TABS
ORAL_TABLET | ORAL | Status: AC
  Filled 2021-04-27: qty 2

## 2021-04-27 MED ORDER — ONDANSETRON HCL 4 MG/2ML IJ SOLN
INTRAMUSCULAR | Status: DC | PRN
  Administered 2021-04-27: 4 mg via INTRAVENOUS

## 2021-04-27 MED ORDER — CEFAZOLIN SODIUM-DEXTROSE 2-4 GM/100ML-% IV SOLN
INTRAVENOUS | Status: AC
  Filled 2021-04-27: qty 100

## 2021-04-27 MED ORDER — PROPOFOL 10 MG/ML IV BOLUS
INTRAVENOUS | Status: AC
  Filled 2021-04-27: qty 20

## 2021-04-27 MED ORDER — LACTATED RINGERS IV SOLN: INTRAVENOUS | Status: DC

## 2021-04-27 MED ORDER — LIDOCAINE 2% (20 MG/ML) 5 ML SYRINGE
INTRAMUSCULAR | Status: DC | PRN
  Administered 2021-04-27: 80 mg via INTRAVENOUS

## 2021-04-27 MED ORDER — OXYCODONE HCL 5 MG PO TABS
ORAL_TABLET | ORAL | Status: AC
  Filled 2021-04-27: qty 1

## 2021-04-27 SURGICAL SUPPLY — 26 items
BLADE SURG 15 STRL LF DISP TIS (BLADE) ×1 IMPLANT
BLADE SURG 15 STRL SS (BLADE) ×1
DECANTER SPIKE VIAL GLASS SM (MISCELLANEOUS) IMPLANT
GAUZE 4X4 16PLY ~~LOC~~+RFID DBL (SPONGE) ×2 IMPLANT
GLOVE SURG ENC MOIS LTX SZ6 (GLOVE) ×4 IMPLANT
GLOVE SURG UNDER POLY LF SZ6.5 (GLOVE) ×2 IMPLANT
GLOVE SURG UNDER POLY LF SZ7 (GLOVE) ×2 IMPLANT
GOWN STRL REUS W/TWL LRG LVL3 (GOWN DISPOSABLE) ×2 IMPLANT
HIBICLENS CHG 4% 4OZ (MISCELLANEOUS) ×2 IMPLANT
KIT TURNOVER CYSTO (KITS) ×2 IMPLANT
NEEDLE HYPO 22GX1.5 SAFETY (NEEDLE) ×2 IMPLANT
NS IRRIG 1000ML POUR BTL (IV SOLUTION) IMPLANT
PACK VAGINAL WOMENS (CUSTOM PROCEDURE TRAY) ×2 IMPLANT
RETRACTOR LONE STAR DISPOSABLE (INSTRUMENTS) ×2 IMPLANT
RETRACTOR STAY HOOK 5MM (MISCELLANEOUS) ×8 IMPLANT
SET IRRIG Y TYPE TUR BLADDER L (SET/KITS/TRAYS/PACK) IMPLANT
SUCTION FRAZIER HANDLE 10FR (MISCELLANEOUS) ×1
SUCTION TUBE FRAZIER 10FR DISP (MISCELLANEOUS) ×1 IMPLANT
SUT MON AB 2-0 SH 27 (SUTURE) IMPLANT
SUT PDS AB 2-0 CT2 27 (SUTURE) ×4 IMPLANT
SUT VIC AB 2-0 SH 27 (SUTURE) ×3
SUT VIC AB 2-0 SH 27XBRD (SUTURE) ×3 IMPLANT
SUT VIC AB 3-0 SH 18 (SUTURE) IMPLANT
SUT VICRYL 2-0 SH 8X27 (SUTURE) IMPLANT
SYR BULB EAR ULCER 3OZ GRN STR (SYRINGE) ×2 IMPLANT
TOWEL OR 17X26 10 PK STRL BLUE (TOWEL DISPOSABLE) ×2 IMPLANT

## 2021-04-27 NOTE — Anesthesia Procedure Notes (Signed)
Procedure Name: LMA Insertion Date/Time: 04/27/2021 2:12 PM Performed by: Cleda Clarks, CRNA Pre-anesthesia Checklist: Patient identified, Emergency Drugs available, Suction available and Patient being monitored Patient Re-evaluated:Patient Re-evaluated prior to induction Oxygen Delivery Method: Circle system utilized Preoxygenation: Pre-oxygenation with 100% oxygen Induction Type: IV induction Ventilation: Mask ventilation without difficulty LMA: LMA inserted LMA Size: 4.0 Number of attempts: 1 Airway Equipment and Method: Bite block Placement Confirmation: positive ETCO2 Tube secured with: Tape Dental Injury: Teeth and Oropharynx as per pre-operative assessment

## 2021-04-27 NOTE — Interval H&P Note (Signed)
History and Physical Interval Note:  04/27/2021 2:02 PM  Sruthi Horsey  has presented today for surgery, with the diagnosis of wound disruption.  The various methods of treatment have been discussed with the patient and family. After consideration of risks, benefits and other options for treatment, the patient has consented to  Procedure(s) with comments: PERINEOPLASTY (N/A)as a surgical intervention.    Vitals:   04/27/21 1314  BP: 122/81  Pulse: 98  Resp: 15  Temp: 99.3 F (37.4 C)  SpO2: 100%    Gen: NAD Lungs: normal respirations Abd: soft, nontender  The patient's history has been reviewed, patient examined, no change in status, stable for surgery.  I have reviewed the patient's chart and labs.  Questions were answered to the patient's satisfaction.     Marguerita Beards

## 2021-04-27 NOTE — Anesthesia Preprocedure Evaluation (Addendum)
Anesthesia Evaluation  Patient identified by MRN, date of birth, ID band Patient awake    Reviewed: Allergy & Precautions, NPO status , Patient's Chart, lab work & pertinent test results  Airway Mallampati: II  TM Distance: >3 FB Neck ROM: Full    Dental no notable dental hx.    Pulmonary neg pulmonary ROS,    Pulmonary exam normal breath sounds clear to auscultation       Cardiovascular Exercise Tolerance: Good negative cardio ROS Normal cardiovascular exam Rhythm:Regular Rate:Normal     Neuro/Psych negative neurological ROS  negative psych ROS   GI/Hepatic negative GI ROS, Neg liver ROS,   Endo/Other  negative endocrine ROS  Renal/GU negative Renal ROS  negative genitourinary   Musculoskeletal negative musculoskeletal ROS (+)   Abdominal   Peds negative pediatric ROS (+)  Hematology negative hematology ROS (+)   Anesthesia Other Findings   Reproductive/Obstetrics Rectovaginal fistula                           Anesthesia Physical Anesthesia Plan  ASA: 2  Anesthesia Plan: General   Post-op Pain Management:    Induction: Intravenous  PONV Risk Score and Plan: 3 and Treatment may vary due to age or medical condition, Midazolam, Scopolamine patch - Pre-op, Ondansetron and Dexamethasone  Airway Management Planned: LMA  Additional Equipment: None  Intra-op Plan:   Post-operative Plan: Extubation in OR  Informed Consent: I have reviewed the patients History and Physical, chart, labs and discussed the procedure including the risks, benefits and alternatives for the proposed anesthesia with the patient or authorized representative who has indicated his/her understanding and acceptance.     Dental advisory given and Interpreter used for interveiw  Plan Discussed with: CRNA and Anesthesiologist  Anesthesia Plan Comments: (Pashto interpreter used for interview. Patient states she  had some numbness of her leg after the last procedure that resolved. Discussed it couldve been due to positioning or even SCDs. Tanna Furry, MD  )      Anesthesia Quick Evaluation

## 2021-04-27 NOTE — Discharge Instructions (Addendum)
POST OPERATIVE INSTRUCTIONS  General Instructions Refrain from strenuous exercise/ housework/ heavy lifting. No lifting >10lbs  Nothing in the vagina- NO intercourse, tampons or douching Bathing:  Do not submerge in water (NO swimming, bath, hot tub, etc) until after your postop visit. You can shower starting the day after surgery.  Sit in a shallow bath with warm water to cover the incision once a day for 10-15 min No driving until you are not taking narcotic pain medicine and until your pain is well controlled  Taking your medications Please take your acetaminophen and ibuprofen on a schedule for the first 48 hours. Take 600mg  ibuprofen, then take 500mg  acetaminophen 3 hours later, then continue to alternate ibuprofen and acetaminophen. That way you are taking each type of medication every 6 hours. Take the prescribed narcotic (oxycodone, tramadol, etc) as needed, with a maximum being every 4 hours.  Take a stool softener daily to keep your stools soft and preventing you from straining. If you have diarrhea, you decrease your stool softener. This is explained more below. We have prescribed you Miralax.  Reasons to Call the Nurse (see last page for phone numbers) Heavy Bleeding (changing your pad every 1-2 hours) Persistent nausea/vomiting Fever (100.4 degrees or more) Incision problems (pus or other fluid coming out, redness, warmth, increased pain)  Things to Expect After Surgery Mild to Moderate pain is normal during the first day or two after surgery. If prescribed, take Ibuprofen or Tylenol first and use the stronger medicine for "break-through" pain. You can overlap these medicines because they work differently.   Constipation   To Prevent Constipation:  Eat a well-balanced diet including protein, grains, fresh fruit and vegetables.  Drink plenty of fluids. Walk regularly.  Depending on specific instructions from your physician: take Miralax daily and additionally you can add a stool  softener (colace/ docusate) and fiber supplement. Continue as long as you're on pain medications.   To Treat Constipation:  If you do not have a bowel movement in 2 days after surgery, you can take 2 Tbs of Milk of Magnesia 1-2 times a day until you have a bowel movement. If diarrhea occurs, decrease the amount or stop the laxative. If no results with Milk of Magnesia, you can drink a bottle of magnesium citrate which you can purchase over the counter.  Fatigue:  This is a normal response to surgery and will improve with time.  Plan frequent rest periods throughout the day.  Return to Work  As work demands and recovery times vary widely, it is hard to predict when you will want to return to work. If you have a desk job with no strenuous physical activity, and if you would like to return sooner than generally recommended, discuss this with your provider or call our office.   Post op concerns  For non-emergent issues, please call the Urogynecology Nurse. Please leave a message and someone will contact you within one business day.  You can also send a message through MyChart.   AFTER HOURS (After 5:00 PM and on weekends):  For urgent matters that cannot wait until the next business day. Call our office [phone] and connect to the doctor on call.  Please reserve this for important issues.   **FOR ANY TRUE EMERGENCY ISSUES CALL 911 OR GO TO THE NEAREST EMERGENCY ROOM.** Please inform our office or the doctor on call of any emergency.     APPOINTMENTS: Call [phone]    Post Anesthesia Home Care Instructions  Activity:  Get plenty of rest for the remainder of the day. A responsible individual must stay with you for 24 hours following the procedure.  For the next 24 hours, DO NOT: -Drive a car -Advertising copywriter -Drink alcoholic beverages -Take any medication unless instructed by your physician -Make any legal decisions or sign important papers.  Meals: Start with liquid foods  such as gelatin or soup. Progress to regular foods as tolerated. Avoid greasy, spicy, heavy foods. If nausea and/or vomiting occur, drink only clear liquids until the nausea and/or vomiting subsides. Call your physician if vomiting continues.  Special Instructions/Symptoms: Your throat may feel dry or sore from the anesthesia or the breathing tube placed in your throat during surgery. If this causes discomfort, gargle with warm salt water. The discomfort should disappear within 24 hours.  If you had a scopolamine patch placed behind your ear for the management of post- operative nausea and/or vomiting:  1. The medication in the patch is effective for 72 hours, after which it should be removed.  Wrap patch in a tissue and discard in the trash. Wash hands thoroughly with soap and water. 2. You may remove the patch earlier than 72 hours if you experience unpleasant side effects which may include dry mouth, dizziness or visual disturbances. 3. Avoid touching the patch. Wash your hands with soap and water after contact with the patch.

## 2021-04-27 NOTE — Op Note (Signed)
Operative Note  Preoperative Diagnosis:  perineal wound breakdown  Postoperative Diagnosis: same  Procedures performed:  Perineoplasty  Implants: * No implants in log *  Attending Surgeon: Lanetta Inch, MD  Anesthesia: General LMA  Findings: Breakdown of superficial perineal skin and subcutaneous tissue down to anus. On rectal exam, external anal sphincter remains intact.   Specimens: * No specimens in log *  Estimated blood loss: 10 mL  IV fluids: 400 mL  Urine output: not recorded  Complications: none  Indications: This is a 25yo F s/p perineoplasty and anal sphincteroplasty on 04/10/21. Sh was seen in the office on 04/20/21 and was noted to have superficial perineal wound breakdown with infection. Decision was made to return to the operating room to reapproximate the wound after oral antibiotics.   Procedure in Detail:  Informed consent was obtained the patient was taken to the operating room where general anesthesia was induced and found to be adequate.  She is placed in dorsal lithotomy position taking care to avoid any nerve injury and prepped and draped in the usual sterile fashion.  Vaginal exam revealed a superficial breakdown of the perineal body incisions.  This extended down to the anus.  Rectal exam was performed and external anal sphincter was noted to be intact.  2-0 Vicryl horizontal mattress sutures were used to approximate the perineal skin surrounding the anus.  The subcutaneous tissue of the perineal body was reapproximated with 2-0 Vicryl suture in interrupted fashion.  The perineal skin was then reapproximated with 2-0 PDS horizontal mattress sutures.  1% lidocaine with epinephrine was injected into the perineal body near the incision.  Hemostasis was noted at all sites.  The patient tolerated the procedure well and was awoken and taken to the recovery room in stable condition.  Needle and sponge counts are correct x2.   Marguerita Beards, MD

## 2021-04-27 NOTE — Transfer of Care (Signed)
Immediate Anesthesia Transfer of Care Note  Patient: Kelsey Mcdaniel  Procedure(s) Performed: PERINEOPLASTY (Vagina )  Patient Location: PACU  Anesthesia Type:General  Level of Consciousness: awake, alert  and oriented  Airway & Oxygen Therapy: Patient Spontanous Breathing and Patient connected to nasal cannula oxygen  Post-op Assessment: Report given to RN and Post -op Vital signs reviewed and stable  Post vital signs: Reviewed and stable  Last Vitals:  Vitals Value Taken Time  BP 117/70 04/27/21 1501  Temp    Pulse 84 04/27/21 1503  Resp 18 04/27/21 1503  SpO2 100 % 04/27/21 1503  Vitals shown include unvalidated device data.  Last Pain:  Vitals:   04/27/21 1314  TempSrc: Oral  PainSc: 4       Patients Stated Pain Goal: 4 (04/27/21 1314)  Complications: No notable events documented.

## 2021-04-27 NOTE — Anesthesia Postprocedure Evaluation (Signed)
Anesthesia Post Note  Patient: Kelsey Mcdaniel  Procedure(s) Performed: PERINEOPLASTY (Vagina )     Patient location during evaluation: PACU Anesthesia Type: General Level of consciousness: sedated Pain management: pain level controlled Vital Signs Assessment: post-procedure vital signs reviewed and stable Respiratory status: spontaneous breathing and respiratory function stable Cardiovascular status: stable Postop Assessment: no apparent nausea or vomiting Anesthetic complications: no   No notable events documented.  Last Vitals:  Vitals:   04/27/21 1545 04/27/21 1600  BP: (!) 151/95 131/68  Pulse: 85 97  Resp: 12 15  Temp:    SpO2: 100% 98%    Last Pain:  Vitals:   04/27/21 1540  TempSrc:   PainSc: 9                  Candra R Cynthis Purington

## 2021-04-28 ENCOUNTER — Encounter (HOSPITAL_BASED_OUTPATIENT_CLINIC_OR_DEPARTMENT_OTHER): Payer: Self-pay | Admitting: Obstetrics and Gynecology

## 2021-04-28 ENCOUNTER — Telehealth: Payer: Self-pay | Admitting: Obstetrics and Gynecology

## 2021-04-28 NOTE — Telephone Encounter (Signed)
Post- Op Call  Kelsey Mcdaniel underwent perineoplasty on 04/27/21 with Dr Florian Buff. The patient reports that her pain is controlled and she is feeling much better than she did last week. She is taking ibuprofen and oxycodone for pain. She is using the sitz bath. She  has not had a bowel movement and is taking miralax for a bowel regimen. She was discharged without a catheter. Advised to call the office if anything changes.   [employer] [phone] used for interpretation  Harrie Jeans, RN

## 2021-04-28 NOTE — Telephone Encounter (Signed)
Kelsey Mcdaniel underwent perineoplasty on 04/27/21.   She did not have a voiding trial.   Please call her for a routine post op check . Thanks!  Marguerita Beards, MD

## 2021-05-02 ENCOUNTER — Encounter (HOSPITAL_BASED_OUTPATIENT_CLINIC_OR_DEPARTMENT_OTHER): Payer: Self-pay | Admitting: *Deleted

## 2021-05-02 NOTE — Progress Notes (Signed)
[facility]  Date of Visit: 05/04/2021  History of Present Illness: Kelsey Mcdaniel is a 25 y.o. female scheduled today for a post-operative visit.   Surgery: s/p repeat perineoplasty after wound breakdown on 04/27/21. Original surgery was 04/10/21 perineoplasty and anal sphincteroplasty.   Today she reports pain has been well controlled. Having regular bowel movements and taking miralax. Has noticed some fluid coming from the incision. Has been doing daily sitz bath. Not wiping with toilet paper, only using water to clean.    Medications: She has a current medication list which includes the following prescription(s): acetaminophen, ibuprofen, ibuprofen, sitz bath, oxycodone, polyethylene glycol powder, polyethylene glycol powder, and PRESCRIPTION MEDICATION.   Allergies: Patient has No Known Allergies.   Physical Exam: BP 112/77   Pulse 84   Wt 163 lb (73.9 kg)   LMP  (LMP Unknown) Comment: patient hasn't had period in 5 months  Breastfeeding Yes   BMI 29.81 kg/m    Pelvic Examination: perineal incisions intact until the most inferior incision near the rectum. Slight opening with some yellow discharge present. Culture obtained. No erythema present.   ---------------------------------------------------------  Assessment and Plan:  1. Yellow-colored discharge from wound     - send culture today - healing well- discussed that the sutures on the perineum are dissolvable but may remove them in a few weeks once healed for comfort.   Return 1 week for repeat wound check  Marguerita Beards, MD

## 2021-05-04 ENCOUNTER — Encounter: Payer: Self-pay | Admitting: Obstetrics and Gynecology

## 2021-05-04 ENCOUNTER — Other Ambulatory Visit: Payer: Self-pay

## 2021-05-04 ENCOUNTER — Ambulatory Visit (INDEPENDENT_AMBULATORY_CARE_PROVIDER_SITE_OTHER): Payer: Medicaid Other | Admitting: Obstetrics and Gynecology

## 2021-05-04 VITALS — BP 112/77 | HR 84 | Wt 163.0 lb

## 2021-05-04 DIAGNOSIS — T148XXA Other injury of unspecified body region, initial encounter: Secondary | ICD-10-CM

## 2021-05-04 DIAGNOSIS — T8189XA Other complications of procedures, not elsewhere classified, initial encounter: Secondary | ICD-10-CM

## 2021-05-11 ENCOUNTER — Ambulatory Visit: Payer: Medicaid Other | Admitting: Obstetrics and Gynecology

## 2021-05-12 ENCOUNTER — Other Ambulatory Visit: Payer: Self-pay

## 2021-05-12 ENCOUNTER — Ambulatory Visit (INDEPENDENT_AMBULATORY_CARE_PROVIDER_SITE_OTHER): Payer: Medicaid Other | Admitting: Obstetrics and Gynecology

## 2021-05-12 ENCOUNTER — Other Ambulatory Visit (HOSPITAL_COMMUNITY)
Admission: RE | Admit: 2021-05-12 | Discharge: 2021-05-12 | Disposition: A | Discharge status: Home / Self Care | Payer: Medicaid Other | Source: Ambulatory Visit | Attending: Obstetrics and Gynecology | Admitting: Obstetrics and Gynecology

## 2021-05-12 ENCOUNTER — Encounter: Payer: Self-pay | Admitting: Obstetrics and Gynecology

## 2021-05-12 VITALS — BP 114/76 | HR 83 | Wt 163.0 lb

## 2021-05-12 DIAGNOSIS — T148XXA Other injury of unspecified body region, initial encounter: Secondary | ICD-10-CM | POA: Insufficient documentation

## 2021-05-12 DIAGNOSIS — L089 Local infection of the skin and subcutaneous tissue, unspecified: Secondary | ICD-10-CM | POA: Insufficient documentation

## 2021-05-12 DIAGNOSIS — X58XXXA Exposure to other specified factors, initial encounter: Secondary | ICD-10-CM | POA: Insufficient documentation

## 2021-05-12 DIAGNOSIS — T8141XA Infection following a procedure, superficial incisional surgical site, initial encounter: Secondary | ICD-10-CM

## 2021-05-12 DIAGNOSIS — K5900 Constipation, unspecified: Secondary | ICD-10-CM

## 2021-05-12 LAB — WOUND CULTURE

## 2021-05-12 MED ORDER — POLYETHYLENE GLYCOL 3350 17 GM/SCOOP PO POWD: 17.0000 g | Freq: Two times a day (BID) | ORAL | 5 refills | Status: AC

## 2021-05-12 MED ORDER — AMOXICILLIN-POT CLAVULANATE 875-125 MG PO TABS: 1.0000 | ORAL_TABLET | Freq: Two times a day (BID) | ORAL | 0 refills | Status: DC

## 2021-05-12 MED ORDER — POLYETHYLENE GLYCOL 3350 17 GM/SCOOP PO POWD: 17.0000 g | Freq: Every day | ORAL | 5 refills | Status: DC

## 2021-05-12 NOTE — Progress Notes (Signed)
[facility]  Date of Visit: 05/12/2021  History of Present Illness: Kelsey Mcdaniel is a 25 y.o. female scheduled today for a post-operative visit.   Surgery: s/p repeat perineoplasty after wound breakdown on 04/27/21. Original surgery was 04/10/21 perineoplasty and anal sphincteroplasty.   Has noticed that there is a yellow discharge from the wound that is worse. Culture returned positive today for klebsiella (multiresistant). Has been using miralax 2x per day. It is the only way she can have a bowel movement. Requesting another prescription.    Medications: She has a current medication list which includes the following prescription(s): acetaminophen, amoxicillin-clavulanate, ibuprofen, sitz bath, polyethylene glycol powder, oxycodone, polyethylene glycol powder, and PRESCRIPTION MEDICATION.   Allergies: Patient has No Known Allergies.   Physical Exam: BP 114/76   Pulse 83   Wt 163 lb (73.9 kg)   Breastfeeding Yes   BMI 29.81 kg/m    Pelvic Examination: perineal incisions intact until the most inferior incision near the rectum. Slight opening with moderate amount of yellow discharge present. Repeat culture obtained. No erythema present.   ---------------------------------------------------------  Assessment and Plan:  1. Wound infection   2. Constipation, unspecified constipation type      - Treated with augmentin x 10 days. Additional culture sent today.  - refilled miralax  Return 2 weeks for repeat wound check  Marguerita Beards, MD

## 2021-05-18 LAB — AEROBIC CULTURE W GRAM STAIN (SUPERFICIAL SPECIMEN)

## 2021-05-22 NOTE — Progress Notes (Signed)
[facility]  Date of Visit: 05/23/2021  History of Present Illness: Kelsey Mcdaniel is a 25 y.o. female scheduled today for a post-operative visit.   Surgery: s/p repeat perineoplasty after wound breakdown on 04/27/21. Original surgery was 04/10/21 perineoplasty and anal sphincteroplasty.   At times, has sharp pain, especially with walking. Pain has improved overall. Still doing daily sitz baths and taking miralax.   Pt also had questions regarding insomnia and restarting her OCPs. Additionally, was asking about her decreasing breast milk supply. States her daughter only eats 3 times per day and she does not pump/ feed other times. Her daughter does not like formula.    Medications: She has a current medication list which includes the following prescription(s): acetaminophen, amoxicillin-clavulanate, ibuprofen, sitz bath, oxycodone, polyethylene glycol powder, polyethylene glycol powder, and PRESCRIPTION MEDICATION.   Allergies: Patient has No Known Allergies.   Physical Exam: BP 113/77   Pulse 90   Wt 163 lb (73.9 kg)    BMI 29.81 kg/m    Pelvic Examination: perineal incisions intact until the most inferior incision near the rectum. Slight opening with small amount of yellow discharge present. No erythema present.   ---------------------------------------------------------  Assessment and Plan:  1. Post-operative state     - Last culture did not have predominant organism, so will not treat with further antibiotics. Wound healing is improving overall.  - Continue daily sitz baths and miralax - Advised that she can be active but to limit bending/ squatting and try not to walk long distances.  - No intercourse until cleared.  - Discussed that she should make an appt with her PCP regarding the insomnia and OCPs (has previously prescribed).  - For her concerns with breastfeeding, advised her to feed the baby or pump every 2-3 hours to help build supply. She was given  information on how to obtain a breast pump with insurance online, or advised to get a manual pump. Also advised her to discuss these concerns with her child's doctor since she feels her child is losing weight.   Return 2 weeks for repeat wound check  Kelsey Beards, MD

## 2021-05-23 ENCOUNTER — Other Ambulatory Visit: Payer: Self-pay

## 2021-05-23 ENCOUNTER — Ambulatory Visit (INDEPENDENT_AMBULATORY_CARE_PROVIDER_SITE_OTHER): Payer: Medicaid Other | Admitting: Obstetrics and Gynecology

## 2021-05-23 ENCOUNTER — Encounter: Payer: Self-pay | Admitting: Obstetrics and Gynecology

## 2021-05-23 VITALS — BP 113/77 | HR 90 | Wt 163.0 lb

## 2021-05-23 DIAGNOSIS — Z9889 Other specified postprocedural states: Secondary | ICD-10-CM

## 2021-06-05 ENCOUNTER — Ambulatory Visit (INDEPENDENT_AMBULATORY_CARE_PROVIDER_SITE_OTHER): Payer: Medicaid Other | Admitting: Obstetrics and Gynecology

## 2021-06-05 ENCOUNTER — Encounter: Payer: Self-pay | Admitting: Obstetrics and Gynecology

## 2021-06-05 ENCOUNTER — Other Ambulatory Visit: Payer: Self-pay

## 2021-06-05 VITALS — BP 122/75 | HR 68 | Wt 163.0 lb

## 2021-06-05 DIAGNOSIS — K5900 Constipation, unspecified: Secondary | ICD-10-CM

## 2021-06-05 DIAGNOSIS — Z9889 Other specified postprocedural states: Secondary | ICD-10-CM

## 2021-06-05 NOTE — Progress Notes (Signed)
[facility]  Date of Visit: 06/05/2021  History of Present Illness: Ms. Chalupa is a 25 y.o. female scheduled today for a post-operative visit.   Surgery: s/p repeat perineoplasty after wound breakdown on 04/27/21. Original surgery was 04/10/21 perineoplasty and anal sphincteroplasty.   Pain is overall well controlled.  Still doing daily sitz baths and taking miralax.   Pt also brought concerns again about her daughter (9 months) not gaining weight. Despite our last conversation, she has not increased the feeding schedule and is still feeding her breastmilk 3x per day. Other times, she is giving her soup because the breast milk does not seem to be enough. I told her that her/ her daughter's PCP has attempted to call them to make an appointment but no one from their household answered. They have set an appointment for them this week.    Medications: She has a current medication list which includes the following prescription(s): acetaminophen, amoxicillin-clavulanate, ibuprofen, sitz bath, oxycodone, polyethylene glycol powder, polyethylene glycol powder, and PRESCRIPTION MEDICATION.   Allergies: Patient has No Known Allergies.   Physical Exam: Vitals:   06/05/21 1614  BP: 122/75  Pulse: 68     Pelvic Examination: perineal incisions intact until the most inferior incision near the rectum. Slight opening with small amount of yellow discharge present. Incision probed and approximately 0.5cm deep. No erythema present. Rectal exam performed- good sphincter tone and no fistula present.   ---------------------------------------------------------  Assessment and Plan:  1. Post-operative state   2. Constipation, unspecified constipation type     - Continue daily sitz baths and miralax. Place small drop of hibiclens in sitz bath.  - Advised that she can be active but to limit bending/ squatting and try not to walk long distances.  - No intercourse until cleared.  - Again,  expressed concern that her child's feeding schedule is not adequate. Provided with 1 Medela Harmony manual breast pump, 5- 2oz bottles of Enfamil ready to use formula, and 1- 32oz container of Parent's choice tender formula powder. Until she can get to PCP for further specific recommendations based on the baby's weight/ health, advised to feed the baby 4 oz formula 6 times per day. She can also breast feed on demand and use the pump to help increase her supply. Advised that while she can give her child solids, soup is not an appropriate substitution for breast milk. Husband was also present at this discussion. Patient states she will attend scheduled visit with PCP this week.  Will also be placing a report to CPS as she has not made any changes to the child's feeding schedule since we discussed this 2 weeks ago, and has not made any attempt to schedule an appointment with the child's doctor.   Return 2-3 weeks for repeat wound check  Marguerita Beards, MD

## 2021-06-05 NOTE — Progress Notes (Signed)
    SUBJECTIVE:   CHIEF COMPLAINT / HPI:   Pashto speaking interpretor used for entirety of visit.  Heartburn-has been present for "a long time."  She notes it is worse with spicy food and onions and pepper.  Her husband has been diagnosed with H. pylori and she recently had H[place] pylori [place] and does not believe she was treated.  Has not tried any medications yet for it.  No blood in stools.  She is able to eat.  Left-sided headache-she notes the pain is out 5 out of 10, it started 2 days ago.  No photophobia or phonophobia.  Is does note that it is pulsing.  Denies any head trauma or falls.  No numbness, weakness, or vision changes.  She took 2 ibuprofen yesterday and the pain decreased but is still present.  She denies any nausea.    PERTINENT  PMH / PSH: third degree tear s/p repair with perineoplasty and anal sphincteroplasty with urogynecology  OBJECTIVE:   BP 112/71   Pulse 79   Ht 5\' 2"  (1.575 m)   Wt 169 lb 2 oz (76.7 kg)   SpO2 100%   BMI 30.93 kg/m   General: alert & oriented, no apparent distress, well groomed HEENT: normocephalic, atraumatic, EOM grossly intact, oral mucosa moist, neck supple, left-sided cervical paraspinal muscles tender to palpation, left trapezius tender to palpation Respiratory: normal respiratory effort GI: non-distended Skin: no rashes, no jaundice Neuro: Cranial nerves II through XII grossly intact, normal gait Psych: appropriate mood and affect   ASSESSMENT/PLAN:   Tension headache - Tension headache suspected based on physical exam, no meningismus or signs of infection, 30 mg IM Toradol x1 given in clinic today discussed later today can use as needed ibuprofen for headache, discussed remaining well-hydrated, no red flag signs or symptoms  Heartburn - With history of H. pylori in that she does not believe has been treated, will recheck for H. Pylori again today as her husband has recently had it as well, if positive will treat  otherwise we will start twice daily PPI and see her back in a month to reassess symptoms, she denies blood in her stool or other worrisome symptoms      [place], MD [facility]

## 2021-06-06 ENCOUNTER — Encounter: Payer: Self-pay | Admitting: Obstetrics and Gynecology

## 2021-06-09 ENCOUNTER — Encounter: Payer: Self-pay | Admitting: Family Medicine

## 2021-06-09 ENCOUNTER — Ambulatory Visit: Payer: Medicaid Other | Admitting: Family Medicine

## 2021-06-09 ENCOUNTER — Other Ambulatory Visit: Payer: Self-pay

## 2021-06-09 VITALS — BP 112/71 | HR 79 | Ht 62.0 in | Wt 169.1 lb

## 2021-06-09 DIAGNOSIS — G44209 Tension-type headache, unspecified, not intractable: Secondary | ICD-10-CM | POA: Insufficient documentation

## 2021-06-09 DIAGNOSIS — R519 Headache, unspecified: Secondary | ICD-10-CM | POA: Diagnosis not present

## 2021-06-09 DIAGNOSIS — R12 Heartburn: Secondary | ICD-10-CM | POA: Insufficient documentation

## 2021-06-09 MED ORDER — KETOROLAC TROMETHAMINE 30 MG/ML IJ SOLN
30.0000 mg | Freq: Once | INTRAMUSCULAR | Status: AC
  Administered 2021-06-09: 15 mg via INTRAMUSCULAR

## 2021-06-09 NOTE — Assessment & Plan Note (Signed)
-   With history of H. pylori in [place] that she does not believe has been treated, will recheck for H. Pylori again today as her husband has recently had it as well, if positive will treat otherwise we will start twice daily PPI and see her back in a month to reassess symptoms, she denies blood in her stool or other worrisome symptoms

## 2021-06-09 NOTE — Assessment & Plan Note (Signed)
-   Tension headache suspected based on physical exam, no meningismus or signs of infection, 30 mg IM Toradol x1 given in clinic today discussed later today can use as needed ibuprofen for headache, discussed remaining well-hydrated, no red flag signs or symptoms

## 2021-06-09 NOTE — Patient Instructions (Addendum)
It was wonderful to see you today.  Please bring ALL of your medications with you to every visit.   Today we talked about:  - I will see you back in one month to reassess your sleep on January 20th at 08:30 AM - We will check you for H pylori today - We gave you influenza vaccine today - We gave you a Toradol injection today   Thank you for choosing [facility].   Please call [phone] with any questions about today's appointment.  Please be sure to schedule follow up at the front  desk before you leave today.   Burley Saver, MD  Family Medicine

## 2021-06-11 LAB — H. PYLORI BREATH TEST: H pylori Breath Test: NEGATIVE

## 2021-06-13 ENCOUNTER — Encounter: Payer: Medicaid Other | Admitting: Obstetrics and Gynecology

## 2021-06-14 ENCOUNTER — Telehealth: Payer: Self-pay | Admitting: Family Medicine

## 2021-06-14 DIAGNOSIS — R12 Heartburn: Secondary | ICD-10-CM

## 2021-06-14 MED ORDER — FAMOTIDINE 20 MG PO TABS: 20.0000 mg | ORAL_TABLET | Freq: Two times a day (BID) | ORAL | 2 refills | Status: DC

## 2021-06-14 NOTE — Telephone Encounter (Signed)
Called and left VM with pashto interpretor. H pylori test negative. Pepcid BID sent to pharmacy.  Burley Saver MD

## 2021-06-27 ENCOUNTER — Ambulatory Visit: Payer: Medicaid Other | Admitting: Obstetrics and Gynecology

## 2021-06-28 ENCOUNTER — Ambulatory Visit: Payer: Medicaid Other | Admitting: Obstetrics and Gynecology

## 2021-06-28 ENCOUNTER — Ambulatory Visit: Payer: Medicaid Other | Admitting: Family Medicine

## 2021-07-02 NOTE — Progress Notes (Signed)
° ° °  SUBJECTIVE:   CHIEF COMPLAINT / HPI:   Heartburn- H Pylori was negative, started Pepcid, notes improvement in heartburn and no further abdominal symptoms.   Irregular bleeding-she notes that she started the Micronor oral contraceptive pills last month and had 3 episodes of spotting.  She notes her period was normal a month before.  She has only been taking these for a month.  She is still breast-feeding.  No other side effects.  She is wondering if this is okay.  Shoulder pain-she notes she has bilateral pain and feeling heavy in her shoulder and neck.  No weakness or numbness.  Sometimes just with flexing her arms they feel sore.  She notices mostly when it is windy outside or after she showers.  She has tried massage oil, Tylenol and those have helped some.   Pashto speaking interpretor present for entirety of visit.  PERTINENT  PMH / PSH: rectovaginal fistula, heartburn  OBJECTIVE:   BP 125/77    Pulse 92    Ht 5\' 2"  (1.575 m)    Wt 167 lb 6.4 oz (75.9 kg)    SpO2 99%    BMI 30.62 kg/m   General: alert & oriented, no apparent distress, well groomed HEENT: normocephalic, atraumatic, EOM grossly intact, oral mucosa moist, neck supple with normal range of motion including flexion extension and rotation without pain, cervicals paraspinal muscles and trapezius muscles bilaterally tender to palpation, no C-spine tenderness to palpation no landmark abnormalities noted Respiratory: normal respiratory effort GI: non-distended Skin: no rashes, no jaundice Psych: appropriate mood and affect Neuro: Strength and sensation in bilateral upper extremities intact   ASSESSMENT/PLAN:   Neck strain - No C-spine tenderness, no neurologic symptoms and normal range of motion of neck - As needed diclofenac gel as well as can continue Tylenol - Suspect related to positions and support of neck and shoulders and breast-feeding and picking up her childrens  Rectovaginal fistula - Doing well,  refilled her MiraLAX, following with urogynecology who has done a reparative surgery  Heartburn - H. pylori negative, continue Pepcid     , MD [facility]

## 2021-07-04 ENCOUNTER — Encounter: Payer: Self-pay | Admitting: Family Medicine

## 2021-07-04 ENCOUNTER — Other Ambulatory Visit: Payer: Self-pay

## 2021-07-04 ENCOUNTER — Ambulatory Visit: Payer: Medicaid Other | Admitting: Family Medicine

## 2021-07-04 VITALS — BP 125/77 | HR 92 | Ht 62.0 in | Wt 167.4 lb

## 2021-07-04 DIAGNOSIS — M25511 Pain in right shoulder: Secondary | ICD-10-CM | POA: Diagnosis not present

## 2021-07-04 DIAGNOSIS — S161XXA Strain of muscle, fascia and tendon at neck level, initial encounter: Secondary | ICD-10-CM

## 2021-07-04 DIAGNOSIS — N823 Fistula of vagina to large intestine: Secondary | ICD-10-CM | POA: Diagnosis not present

## 2021-07-04 DIAGNOSIS — R12 Heartburn: Secondary | ICD-10-CM

## 2021-07-04 DIAGNOSIS — M25512 Pain in left shoulder: Secondary | ICD-10-CM

## 2021-07-04 DIAGNOSIS — Z3009 Encounter for other general counseling and advice on contraception: Secondary | ICD-10-CM

## 2021-07-04 MED ORDER — DICLOFENAC SODIUM 1 % EX GEL: 4.0000 g | Freq: Four times a day (QID) | CUTANEOUS | 2 refills | Status: DC | PRN

## 2021-07-04 MED ORDER — NORETHINDRONE 0.35 MG PO TABS: 1.0000 | ORAL_TABLET | Freq: Every day | ORAL | 3 refills | Status: DC

## 2021-07-04 MED ORDER — POLYETHYLENE GLYCOL 3350 17 GM/SCOOP PO POWD: 17.0000 g | Freq: Three times a day (TID) | ORAL | 1 refills | Status: DC

## 2021-07-04 NOTE — Assessment & Plan Note (Signed)
-   No C-spine tenderness, no neurologic symptoms and normal range of motion of neck - As needed diclofenac gel as well as can continue Tylenol - Suspect related to positions and support of neck and shoulders and breast-feeding and picking up her childrens

## 2021-07-04 NOTE — Assessment & Plan Note (Signed)
-   Doing well, refilled her MiraLAX, following with urogynecology who has done a reparative surgery

## 2021-07-04 NOTE — Patient Instructions (Addendum)
It was wonderful to see you today.  Please bring ALL of your medications with you to every visit.   Today we talked about:  - Please continue the Pepcid for your heartburn - I have refilled your birth control, your Miralax - I have sent in the Diclofenac gel for your shoulders   Thank you for choosing [facility].   Please call [phone] with any questions about today's appointment.  Please be sure to schedule follow up at the front  desk before you leave today.   Burley Saver, MD  Family Medicine

## 2021-07-04 NOTE — Assessment & Plan Note (Signed)
-   H. pylori negative, continue Pepcid

## 2021-07-07 ENCOUNTER — Ambulatory Visit (INDEPENDENT_AMBULATORY_CARE_PROVIDER_SITE_OTHER): Payer: Medicaid Other | Admitting: Obstetrics and Gynecology

## 2021-07-07 ENCOUNTER — Other Ambulatory Visit: Payer: Self-pay

## 2021-07-07 ENCOUNTER — Encounter: Payer: Self-pay | Admitting: Obstetrics and Gynecology

## 2021-07-07 VITALS — BP 119/81 | HR 90

## 2021-07-07 DIAGNOSIS — M791 Myalgia, unspecified site: Secondary | ICD-10-CM | POA: Diagnosis not present

## 2021-07-07 NOTE — Progress Notes (Signed)
[facility]  Date of Visit: 07/07/2021  History of Present Illness: Kelsey Mcdaniel is a 26 y.o. female scheduled today for a post-operative visit.   Surgery: s/p repeat perineoplasty after wound breakdown on 04/27/21. Original surgery was 04/10/21 perineoplasty and anal sphincteroplasty.   Pain is controlled. Occasionally notices some bleeding near the rectum. Feels a fullness in the perineal area and the sutures there are bothering her.   She complains of aching and swelling in her hips, back and legs.    Medications: She has a current medication list which includes the following prescription(s): acetaminophen, diclofenac sodium, famotidine, norethindrone, polyethylene glycol powder, and ibuprofen.   Allergies: Patient has No Known Allergies.   Physical Exam: Vitals:   07/07/21 1608  BP: 119/81  Pulse: 90     Pelvic Examination: perineal incisions intact but near rectum slight opening with small amount of yellow discharge still persists. This was probed with a small q-tip with a rectal exam and there is no connection present, good sphincter tone. Slight bleeding present after probing- treated with silver nitrate. No erythema present. Two PDS sutures removed from perineum.   ---------------------------------------------------------  Assessment and Plan:  1. Muscle pain    - Overall surgery site has improved. Silver nitrate application should help facilitate wound healing at the area above the rectum.  - Continue daily sitz baths and miralax.  - Advised that she can return to normal activity including intercourse if desired (but recommended no rectal penetration).  - referral placed for pelvic floor PT for muscle rehab  Return 1 month for repeat wound check  Kelsey Folds, MD   Time spent: I spent 20 minutes dedicated to the care of this patient on the date of this encounter to include pre-visit review of records, face-to-face time with the patient and post  visit documentation.

## 2021-07-14 ENCOUNTER — Ambulatory Visit: Payer: Medicaid Other | Admitting: Family Medicine

## 2021-07-31 ENCOUNTER — Ambulatory Visit: Payer: Medicaid Other | Admitting: Family Medicine

## 2021-07-31 ENCOUNTER — Other Ambulatory Visit: Payer: Self-pay

## 2021-07-31 ENCOUNTER — Encounter: Payer: Self-pay | Admitting: Family Medicine

## 2021-07-31 VITALS — BP 109/77 | HR 84 | Ht 62.0 in | Wt 167.4 lb

## 2021-07-31 DIAGNOSIS — Z23 Encounter for immunization: Secondary | ICD-10-CM | POA: Diagnosis not present

## 2021-07-31 DIAGNOSIS — Z0289 Encounter for other administrative examinations: Secondary | ICD-10-CM

## 2021-07-31 NOTE — Progress Notes (Signed)
° ° °  SUBJECTIVE:   CHIEF COMPLAINT / HPI:   F/u vaccinations- needing vaccines updated today. Appt for Green card on Thursday.  PERTINENT  PMH / PSH: rectovaginal fistula s/p repair, fourth degree s/p repair  OBJECTIVE:   BP 109/77    Pulse 84    Ht 5\' 2"  (1.575 m)    Wt 167 lb 6.4 oz (75.9 kg)    SpO2 99%    BMI 30.62 kg/m   General: alert & oriented, no apparent distress, well groomed HEENT: normocephalic, atraumatic, EOM grossly intact, oral mucosa moist, neck supple Respiratory: normal respiratory effort GI: non-distended Skin: no rashes, no jaundice Psych: appropriate mood and affect   ASSESSMENT/PLAN:   Encounter for vaccination - 4 vaccines given today, letter given for adult polio to be given by pharmacy or health department    Vaccinations given today.  , MD [facility]

## 2021-07-31 NOTE — Assessment & Plan Note (Signed)
-   4 vaccines given today, letter given for adult polio to be given by pharmacy or health department

## 2021-07-31 NOTE — Patient Instructions (Addendum)
It was wonderful to see you today.  Please bring ALL of your medications with you to every visit.   Today we talked about:  - We gave you vaccinations today - You still need adult polio vaccine, I have written a letter for this   Thank you for choosing [facility].   Please call [phone] with any questions about today's appointment.  Please be sure to schedule follow up at the front  desk before you leave today.   Yehuda Savannah, MD  Family Medicine

## 2021-08-09 ENCOUNTER — Ambulatory Visit (INDEPENDENT_AMBULATORY_CARE_PROVIDER_SITE_OTHER): Payer: Medicaid Other | Admitting: Obstetrics and Gynecology

## 2021-08-09 ENCOUNTER — Encounter: Payer: Self-pay | Admitting: Obstetrics and Gynecology

## 2021-08-09 ENCOUNTER — Other Ambulatory Visit: Payer: Self-pay

## 2021-08-09 DIAGNOSIS — N823 Fistula of vagina to large intestine: Secondary | ICD-10-CM | POA: Diagnosis not present

## 2021-08-09 MED ORDER — POLYETHYLENE GLYCOL 3350 17 GM/SCOOP PO POWD: 17.0000 g | Freq: Three times a day (TID) | ORAL | 11 refills | Status: DC

## 2021-08-09 NOTE — Progress Notes (Signed)
[facility]  Date of Visit: 08/09/2021  History of Present Illness: Ms. Kelsey Mcdaniel is a 26 y.o. female scheduled today for a post-operative visit.   Surgery: s/p repeat perineoplasty after wound breakdown on 04/27/21. Original surgery was 04/10/21 perineoplasty and anal sphincteroplasty.   Pain is controlled, and feeling that area is better healed. Occasionally notices some bleeding near the rectum.   Has an appointment with physical therapy. Overall her pain in hips has improved already. Has had intercourse and did not have any issues.   Medications: She has a current medication list which includes the following prescription(s): acetaminophen, diclofenac sodium, ibuprofen, norethindrone, famotidine, and polyethylene glycol powder.   Allergies: Patient has No Known Allergies.   Physical Exam: Vitals:   08/09/21 1630  BP: 100/70  Pulse: 70     Pelvic Examination: perineal incisions. Area of prior slow healing area near rectum has healed with only superficial skin area remaining. No depth when probed. No sutures present. Silver nitrate again applied.   ---------------------------------------------------------  Assessment and Plan:  1. Rectovaginal fistula    - Overall surgery site has improved. On superficial skin area remaining to be healed.  - Continue daily miralax as needed, refills provied.   Return 1 month for repeat wound check  Marguerita Beards, MD   Time spent: I spent 20 minutes dedicated to the care of this patient on the date of this encounter to include pre-visit review of records, face-to-face time with the patient and post visit documentation.

## 2021-08-18 ENCOUNTER — Other Ambulatory Visit: Payer: Self-pay

## 2021-08-18 ENCOUNTER — Ambulatory Visit
Admission: RE | Admit: 2021-08-18 | Discharge: 2021-08-18 | Disposition: A | Discharge status: Home / Self Care | Payer: Medicaid Other | Source: Ambulatory Visit | Attending: Family Medicine | Admitting: Family Medicine

## 2021-08-18 ENCOUNTER — Other Ambulatory Visit: Payer: Self-pay | Admitting: Family Medicine

## 2021-08-18 DIAGNOSIS — Z111 Encounter for screening for respiratory tuberculosis: Secondary | ICD-10-CM

## 2021-08-23 ENCOUNTER — Encounter: Payer: Self-pay | Admitting: Physical Therapy

## 2021-08-23 ENCOUNTER — Other Ambulatory Visit: Payer: Self-pay

## 2021-08-23 ENCOUNTER — Ambulatory Visit: Payer: Medicaid Other | Attending: Obstetrics and Gynecology | Admitting: Physical Therapy

## 2021-08-23 DIAGNOSIS — R279 Unspecified lack of coordination: Secondary | ICD-10-CM | POA: Insufficient documentation

## 2021-08-23 DIAGNOSIS — M62838 Other muscle spasm: Secondary | ICD-10-CM | POA: Diagnosis present

## 2021-08-23 DIAGNOSIS — R293 Abnormal posture: Secondary | ICD-10-CM | POA: Diagnosis present

## 2021-08-23 DIAGNOSIS — M6281 Muscle weakness (generalized): Secondary | ICD-10-CM | POA: Diagnosis present

## 2021-08-23 NOTE — Therapy (Signed)
OUTPATIENT PHYSICAL THERAPY FEMALE PELVIC EVALUATION   Patient Name: Kelsey Mcdaniel MRN: [id number] DOB:[birth year], 26 y.o., female Today's Date: 08/23/2021   PT End of Session - 08/23/21 0918     Visit Number 1    Date for PT Re-Evaluation 11/23/21    Authorization Type [employer]blue    Authorization - Number of Visits 27    PT Start Time [phone]   pt arrival time   PT Stop Time 0930    PT Time Calculation (min) 32 min    Activity Tolerance Patient tolerated treatment well    Behavior During Therapy [facility] for tasks assessed/performed             Past Medical History:  Diagnosis Date   Cloacal malformation    History of pregnancy induced hypertension    Past Surgical History:  Procedure Laterality Date   PERINEOPLASTY N/A 04/10/2021   Procedure: PERINEOPLASTY;  Surgeon: Marguerita Beards, MD;  Location[facility];  Service: Gynecology;  Laterality: N/A;   PERINEOPLASTY N/A 04/27/2021   Procedure: PERINEOPLASTY;  Surgeon: Marguerita Beards, MD;  Location: [facility];  Service: Gynecology;  Laterality: N/A;  total time requested is 1 hour   RECTOVAGINAL FISTULA CLOSURE  2021   Patient Active Problem List   Diagnosis Date Noted   Encounter for vaccination 07/31/2021   Neck strain 07/04/2021   Tension headache 06/09/2021   Heartburn 06/09/2021   Constipation 01/13/2021   Iron deficiency anemia 01/13/2021   Fourth degree laceration of perineum during delivery, postpartum 12/21/2020   History of gestational hypertension 06/30/2020   Encounter for health examination of refugee 06/02/2020   Rectovaginal fistula 06/02/2020    PCP: Billey Co, MD  REFERRING PROVIDER: Marguerita Beards, *  REFERRING DIAG: M79.10 (ICD-10-CM) - Muscle pain  THERAPY DIAG:  Muscle weakness (generalized)  Abnormal posture  Unspecified lack of coordination  Other muscle spasm  ONSET DATE: 10/11/20  SUBJECTIVE:                                                                                                                                                                                            SUBJECTIVE STATEMENT: Pt reports needing repeat perineoplasty after wound breakdown on 04/27/21  original surgery 04/10/21 and birth of 2nd child (10/11/20). Pt did need perineoplasty and anal sphincteroplasty with first child. Pt now having abdominal swelling, cramping/spasms in abdomen traveling into back and tightness in pelvis. Is still breastfeeding.  Fluid intake: No   Patient confirms identification and approves PT to assess pelvic floor and treatment Yes  PERTINENT HISTORY:   Sexual abuse: No  PAIN:  Are you having pain? Yes NPRS scale: 6/10 Pain location:  lower abdomen, into back and sometimes. Pt also has some cramping where stitches were after surgery  Pain type: cramping Pain description: intermittent   Aggravating factors: being cold, sometimes over doing it with caring for children or housework Relieving factors: massaging areas   BOWEL MOVEMENT Pain with bowel movement: No Type of bowel movement:Type (Bristol Stool Scale) 4, Frequency 2-3x daily, and Strain No Fully empty rectum: Yes:   Leakage: No Pads: No Fiber supplement: Yes: merilax   URINATION Pain with urination: No Fully empty bladder: yes  Stream: Strong Urgency: No Frequency: no more than every 3 hours Leakage:  none Pads: No  INTERCOURSE Pain with intercourse:  none per pt Ability to have vaginal penetration:  Yes:    Climax: Yes:   Marinoff Scale 0  PREGNANCY Vaginal deliveries 2 Tearing Yes: 4th degree with both C-section deliveries 0 Currently pregnant No  PROLAPSE: reports she does not have feelings of bulge or heaviness   PRECAUTIONS: None - postpartum  WEIGHT BEARING RESTRICTIONS No  FALLS:  Has patient fallen in last 6 months? No, Number of falls: 0  LIVING ENVIRONMENT: Lives with: lives with their family Lives in:  House/apartment  Has following equipment at home: None  OCCUPATION: none  PLOF: Independent  PATIENT GOALS to have less tightness in abdomen and less pain overall   OBJECTIVE:   Functional squat assessed to have poor body mechanics and noted strength and breathing deficits during descent and ascent portions.    COGNITION:  Overall cognitive status: Within functional limits for tasks assessed     SENSATION:  Light touch: Appears intact  Proprioception: Appears intact  MUSCLE LENGTH: Bil hamstrings, adductors, hip IR  limited by 25%  POSTURE:  Rounded shoulders, posterior pelvic tilt  PALPATION: Internal Pelvic Floor deferred until next session  External Perineal Exam deferred until next session  Globally: pt had TTP at lower abdominal quadrants; decreased rib mobility with impaired breathing mechanics   LUMBARAROM/PROM  Decreased bil lumbar side bending by 25% and flexion 25%  LE AROM/PROM:  Bil hips WFL  LE MMT:  Rt hip grossly 4/5 with exception of abduction 3/5; Lt hip grossly 3/5  PELVIC MMT: to be assessed at next visit   MMT  08/23/2021  Vaginal   Internal Anal Sphincter   External Anal Sphincter   Puborectalis   Diastasis Recti   (Blank rows = not tested)   TONE: To be assessed at next visit  PROLAPSE: To be assessed at next visit     TODAY'S TREATMENT  EVAL: No treatment completed at end of evaluation and unfortunately pt arrived late and session limited due to time   PATIENT EDUCATION:  Education details: Pt educated on exam findings, POC  Person educated: Patient and interpreter present for language needs Education method: Explanation, Demonstration, Tactile cues, and Verbal cues Education comprehension: verbalized understanding and returned demonstration   HOME EXERCISE PROGRAM: [email or web address]    ASSESSMENT:  CLINICAL IMPRESSION: Patient is a 26 y.o. female who was seen today for physical therapy evaluation and treatment for  muscle pain in abdomen, back, hips, and pelvis status post repeat perineoplasty after wound breakdown on 04/27/21. Original surgery was 04/10/21 perineoplasty and anal sphincteroplasty. Pt found to have decreased spinal and hip flexibility, decreased bil hip and core strength, poor lifting mechanics, impaired posture and breathing mechanics, decreased rib mobility and TTP at lower abdominal quadrants. Pelvic floor assessment deferred this session  with limitations of time.     OBJECTIVE IMPAIRMENTS decreased coordination, decreased endurance, decreased mobility, decreased strength, increased fascial restrictions, increased muscle spasms, impaired tone, improper body mechanics, postural dysfunction, and pain.   ACTIVITY LIMITATIONS community activity and caring for children with lifting or carrying   .   PERSONAL FACTORS Fitness, Past/current experiences, Time since onset of injury/illness/exacerbation, and 1-2 comorbidities: x2 grade 4 tears with births and repeast perineoplasty  are also affecting patient's functional outcome.    REHAB POTENTIAL: Good  CLINICAL DECISION MAKING: Evolving/moderate complexity  EVALUATION COMPLEXITY: Moderate   GOALS: Goals reviewed with patient? Yes  SHORT TERM GOALS:  STG Name Target Date Goal status  1 Pt to be I with HEP.  Baseline:  09/20/2021 INITIAL  2 Pt to report no more than 4/10 pain with standing at least 30 mins for improved ability to care for children.  Baseline: <30 mins at a time with ~6/10 pain 09/20/2021 INITIAL  3  Baseline:    4  Baseline:    5  Baseline:    6  Baseline:    7  Baseline:     LONG TERM GOALS:   LTG Name Target Date Goal status  1 Pt to be I with advanced HEP.  Baseline: given at eval 11/23/2021 INITIAL  2 Pt to demonstrate at least 5/5 bil hip strength for improved pelvic stability and functional squats without leakage.  Baseline: 3/5 Lt LE 11/23/2021 INITIAL  3 Pt to report no more than 4/10 pain with standing  at least 1 hour for improved ability to care for children.  Baseline:<30 mins 6/10 pain 11/23/2021 INITIAL  4 Pt to report no straining for Bms, recall good voiding mechanics at least 90% of the time Baseline: 11/23/2021 INITIAL  5  Baseline:    6  Baseline:    7  Baseline:     PLAN: PT FREQUENCY: 1x/week  PT DURATION:  8 sessions  PLANNED INTERVENTIONS: Therapeutic exercises, Therapeutic activity, Neuromuscular re-education, Gait training, Patient/Family education, Joint mobilization, Aquatic Therapy, Cryotherapy, Moist heat, Manual lymph drainage, scar mobilization, Taping, and Manual therapy  PLAN FOR NEXT SESSION: internal pelvic floor assessment if able and pt agreeable.    Otelia Sergeant, PT, DPT 03/01/233:06 PM

## 2021-08-23 NOTE — Patient Instructions (Signed)
Access Code: [email or web address] ?URL: [email or web address] ?Date: 08/23/2021 ?Prepared by: [facility] ? ?Exercises ?Seated Diaphragmatic Breathing - 1 x daily - 7 x weekly - 1 sets - 10 reps ?Cat Cow - 1 x daily - 7 x weekly - 1 sets - 10 reps ?Baby Cobra Hands Down - 1 x daily - 7 x weekly - 1 sets - 10 reps - 30s-45s holds ?V Sit Hip Adductor Hamstring Stretch - 1 x daily - 7 x weekly - 1 sets - 10 reps - 30-45s holds ?Sidelying Open Book Thoracic Lumbar Rotation and Extension - 1 x daily - 7 x weekly - 1 sets - 10 reps - 10s holds ?Quadruped Thoracic Rotation - Reach Under - 1 x daily - 7 x weekly - 1 sets - 2-3 reps - 30-45s holds ? ?

## 2021-08-29 ENCOUNTER — Ambulatory Visit: Payer: Medicaid Other | Admitting: Physical Therapy

## 2021-08-29 ENCOUNTER — Other Ambulatory Visit: Payer: Self-pay

## 2021-08-29 DIAGNOSIS — M6281 Muscle weakness (generalized): Secondary | ICD-10-CM

## 2021-08-29 DIAGNOSIS — R293 Abnormal posture: Secondary | ICD-10-CM

## 2021-08-29 DIAGNOSIS — R279 Unspecified lack of coordination: Secondary | ICD-10-CM

## 2021-08-29 NOTE — Therapy (Signed)
OUTPATIENT PHYSICAL THERAPY TREATMENT NOTE   Patient Name: Kelsey Mcdaniel MRN: [id number] DOB:September 27, 1995, 26 y.o., female Today's Date: 08/29/2021  PCP: Billey Co, MD REFERRING PROVIDER: Marguerita Beards, *   PT End of Session - 08/29/21 1411     Visit Number 2    Date for PT Re-Evaluation 11/23/21    Authorization Type healthyblue    Authorization - Number of Visits 27    PT Start Time 1408   pt arrival time   PT Stop Time 1444    PT Time Calculation (min) 36 min    Activity Tolerance Patient tolerated treatment well    Behavior During Therapy [facility] for tasks assessed/performed             Past Medical History:  Diagnosis Date   Cloacal malformation    History of pregnancy induced hypertension    Past Surgical History:  Procedure Laterality Date   PERINEOPLASTY N/A 04/10/2021   Procedure: PERINEOPLASTY;  Surgeon: Marguerita Beards, MD;  Location: [facility];  Service: Gynecology;  Laterality: N/A;   PERINEOPLASTY N/A 04/27/2021   Procedure: PERINEOPLASTY;  Surgeon: Marguerita Beards, MD;  Location: [facility];  Service: Gynecology;  Laterality: N/A;  total time requested is 1 hour   RECTOVAGINAL FISTULA CLOSURE  2021   Patient Active Problem List   Diagnosis Date Noted   Encounter for vaccination 07/31/2021   Neck strain 07/04/2021   Tension headache 06/09/2021   Heartburn 06/09/2021   Constipation 01/13/2021   Iron deficiency anemia 01/13/2021   Fourth degree laceration of perineum during delivery, postpartum 12/21/2020   History of gestational hypertension 06/30/2020   Encounter for health examination of refugee 06/02/2020   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 Original surgery was 04/10/21 perineoplasty and anal sphincteroplasty.   PRECAUTIONS: Postpartum (10 months and 3 years)  SUBJECTIVE: Some pain at bil shoulders and back and lower abdomen. Pt also report she has pain at surgery site with a lot of housework or childcare.   PAIN:  Are you having pain? Yes NPRS scale: 5/10 Pain location: bil shoulders, low back, and lower abdomen Pain orientation: Other: see above for details   PAIN TYPE: aching Pain description: intermittent  Relieving factors: massage in those areas help Patient reports she sometimes has pain with intercourse but others not, but always has a lot of pain after intercourse and sometimes has intercourse.      OBJECTIVE:    Functional squat assessed to have poor body mechanics and noted strength and breathing deficits during descent and ascent portions.      COGNITION:            Overall cognitive status: Within functional limits for tasks assessed                          SENSATION:            Light touch: Appears intact            Proprioception: Appears intact   MUSCLE LENGTH: Bil hamstrings, adductors, hip IR  limited by 25%   POSTURE:  Rounded shoulders, posterior pelvic tilt   PALPATION: Internal Pelvic Floor deferred until next session   External  Perineal Exam deferred until next session   Globally: pt had TTP at lower abdominal quadrants; decreased rib mobility with impaired breathing mechanics     LUMBARAROM/PROM   Decreased bil lumbar side bending by 25% and flexion 25%   LE AROM/PROM:   Bil hips WFL   LE MMT:   Rt hip grossly 4/5 with exception of abduction 3/5; Lt hip grossly 3/5   PELVIC MMT: to be assessed at next visit   MMT   08/23/2021  Vaginal    Internal Anal Sphincter    External Anal Sphincter    Puborectalis    Diastasis Recti    (Blank rows = not tested)     TONE: To be assessed at next visit   PROLAPSE: To be  assessed at next visit         TODAY'S TREATMENT  EVAL: No treatment completed at end of evaluation and unfortunately pt arrived late and session limited due to time  08/29/2021 : 2x30s knee fallouts for adductor stretching; x10 diaphragmatic breathing with tactile cues; x10 tail wags in quad; 2x30 mini cobra with forearms support unable to tolerate bil elbow extension for press up; thoracic opener in side lying 2x30s each with modifications for less mobility in twist to decrease pain; child's pose x45s Pt given and educated more in detail on HEP and asked to review, PT demonstrated all exercises and pt completed one rep of any exercise we did not complete in session. Pt denied additional questions at end of session     PATIENT EDUCATION:  Education details: Pt educated on HEP Person educated: Patient and interpreter present for language needs Education method: Explanation, Demonstration, Tactile cues, and Verbal cues Education comprehension: verbalized understanding and returned demonstration     HOME EXERCISE PROGRAM: [id number]     ASSESSMENT:   CLINICAL IMPRESSION: Patient reports she does still have pain at surgery site and sometimes very little bleeding is noted from surgery site if she has done a lot of housework and childcare. 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 Pt reports her doctor told her to do 3x10 pelvic floor contractions in the bed and she has been doing these, if she doesn't have swelling or any bleeding she has not pain with them. However if she is some swelling or bleeding they are painful. Pt session limited with need of a female interpreter only during session and x2 attempts needed with video interpreter and wait times to connect with a female interpreter. Pt able to speak and understand small amounts of english however PT did not feel comfortable performing internal vaginal assessment of  pelvic floor without interpreter to allow pt understanding, pt able to agree. Pt did agree to continue without an interpreter for exercises, but did not want a female interpreter. Interpreter used to confirm this and attempted to connect with female x2 all busy. Session completed at pt request without interpreter as no female interpreters available for session. Pt tolerated well but does demonstrate tightness with pain at end range with spinal mobility, hip mobility, and at shoulders and abdomen. With modifications for decreased range of stretch, pt reported no more pain and felt better with stretching. Pt continues to demonstrate need for PT to further address deficits.      OBJECTIVE IMPAIRMENTS decreased coordination, decreased endurance, decreased mobility, decreased strength, increased fascial restrictions, increased muscle spasms, impaired tone, improper body mechanics, postural dysfunction, and pain.  ACTIVITY LIMITATIONS community activity and caring for children with lifting or carrying   .    PERSONAL FACTORS Fitness, Past/current experiences, Time since onset of injury/illness/exacerbation, and 1-2 comorbidities: x2 grade 4 tears with births and repeast perineoplasty  are also affecting patient's functional outcome.      REHAB POTENTIAL: Good   CLINICAL DECISION MAKING: Evolving/moderate complexity   EVALUATION COMPLEXITY: Moderate     GOALS: Goals reviewed with patient? Yes   SHORT TERM GOALS:   STG Name Target Date Goal status  1 Pt to be I with HEP.  Baseline:  09/20/2021 INITIAL  2 Pt to report no more than 4/10 pain with standing at least 30 mins for improved ability to care for children.  Baseline: <30 mins at a time with ~6/10 pain 09/20/2021 INITIAL  3   Baseline:      4   Baseline:      5   Baseline:      6   Baseline:      7   Baseline:        LONG TERM GOALS:    LTG Name Target Date Goal status  1 Pt to be I with advanced HEP.   Baseline: given at eval  11/23/2021 INITIAL  2 Pt to demonstrate at least 5/5 bil hip strength for improved pelvic stability and functional squats without leakage.  Baseline: 3/5 Lt LE 11/23/2021 INITIAL  3 Pt to report no more than 4/10 pain with standing at least 1 hour for improved ability to care for children.  Baseline:<30 mins 6/10 pain 11/23/2021 INITIAL  4 Pt to report no straining for Bms, recall good voiding mechanics at least 90% of the time Baseline: 11/23/2021 INITIAL  5   Baseline:      6   Baseline:      7   Baseline:        PLAN: PT FREQUENCY: 1x/week   PT DURATION:  8 sessions   PLANNED INTERVENTIONS: Therapeutic exercises, Therapeutic activity, Neuromuscular re-education, Gait training, Patient/Family education, Joint mobilization, Aquatic Therapy, Cryotherapy, Moist heat, Manual lymph drainage, scar mobilization, Taping, and Manual therapy   PLAN FOR NEXT SESSION: internal pelvic floor assessment if able and pt agreeable.     Otelia Sergeant, PT, DPT 03/07/232:48 PM

## 2021-09-06 ENCOUNTER — Ambulatory Visit: Payer: Medicaid Other | Admitting: Physical Therapy

## 2021-09-06 ENCOUNTER — Ambulatory Visit (INDEPENDENT_AMBULATORY_CARE_PROVIDER_SITE_OTHER): Payer: Medicaid Other | Admitting: Obstetrics and Gynecology

## 2021-09-06 ENCOUNTER — Encounter: Payer: Self-pay | Admitting: Physical Therapy

## 2021-09-06 ENCOUNTER — Other Ambulatory Visit: Payer: Self-pay

## 2021-09-06 ENCOUNTER — Encounter: Payer: Self-pay | Admitting: Obstetrics and Gynecology

## 2021-09-06 VITALS — BP 108/65 | HR 68

## 2021-09-06 DIAGNOSIS — R293 Abnormal posture: Secondary | ICD-10-CM

## 2021-09-06 DIAGNOSIS — T8131XS Disruption of external operation (surgical) wound, not elsewhere classified, sequela: Secondary | ICD-10-CM

## 2021-09-06 DIAGNOSIS — M6281 Muscle weakness (generalized): Secondary | ICD-10-CM

## 2021-09-06 DIAGNOSIS — R279 Unspecified lack of coordination: Secondary | ICD-10-CM

## 2021-09-06 DIAGNOSIS — T8131XD Disruption of external operation (surgical) wound, not elsewhere classified, subsequent encounter: Secondary | ICD-10-CM | POA: Diagnosis not present

## 2021-09-06 DIAGNOSIS — M62838 Other muscle spasm: Secondary | ICD-10-CM

## 2021-09-06 NOTE — Therapy (Signed)
?OUTPATIENT PHYSICAL THERAPY TREATMENT NOTE ? ? ?Patient Name: Kelsey Mcdaniel ?MRN: [id number] ?DOB:1995-08-23, 26 y.o., female ?Today's Date: 09/06/2021 ? ?PCP: Lenoria Chime, MD ?REFERRING PROVIDER: Jaquita Folds, * ? ? PT End of Session - 09/06/21 1157   ? ? Visit Number 3   ? Date for PT Re-Evaluation 11/23/21   ? Authorization Type healthyblue   ? Authorization - Number of Visits 27   ? PT Start Time 1155   pt arrival time  ? PT Stop Time 1227   ? PT Time Calculation (min) 32 min   ? Activity Tolerance Patient tolerated treatment well   ? Behavior During Therapy [facility] for tasks assessed/performed   ? ?  ?  ? ?  ? ? ?Past Medical History:  ?Diagnosis Date  ? Cloacal malformation   ? History of pregnancy induced hypertension   ? ?Past Surgical History:  ?Procedure Laterality Date  ? PERINEOPLASTY N/A 04/10/2021  ? Procedure: PERINEOPLASTY;  Surgeon: Jaquita Folds, MD;  Location: [facility];  Service: Gynecology;  Laterality: N/A;  ? PERINEOPLASTY N/A 04/27/2021  ? Procedure: PERINEOPLASTY;  Surgeon: Jaquita Folds, MD;  Location: [facility];  Service: Gynecology;  Laterality: N/A;  total time requested is 1 hour  ? RECTOVAGINAL FISTULA CLOSURE  2021  ? ?Patient Active Problem List  ? Diagnosis Date Noted  ? Encounter for vaccination 07/31/2021  ? Neck strain 07/04/2021  ? Tension headache 06/09/2021  ? Heartburn 06/09/2021  ? Constipation 01/13/2021  ? Iron deficiency anemia 01/13/2021  ? Fourth degree laceration of perineum during delivery, postpartum 12/21/2020  ? History of gestational hypertension 06/30/2020  ? Encounter for health examination of refugee 06/02/2020  ? 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 ?REFERRING DIAG: M79.10 (ICD-10-CM) - Muscle pain ?REFERRING PROVIDER: Jaquita Folds, * ?THERAPY DIAG:  ?Muscle weakness (generalized) ?  ?Unspecified lack of coordination ?  ?Abnormal posture ?  ?PERTINENT HISTORY: prior recto-vaginal fistula ?s/p  repeat perineoplasty after wound breakdown on 04/27/21. Original surgery was 04/10/21 perineoplasty and anal sphincteroplasty.  ?  ?PRECAUTIONS: Postpartum (10 months and 3 years) ? ?SUBJECTIVE: Pt reports having global swelling this past week and limited mobility and ability to do HEP also had pain with this.  ? ?PAIN:  ?Are you having pain? No ? ? ? ? ? ?OBJECTIVE:  ?  ?Functional squat assessed to have poor body mechanics and noted strength and breathing deficits during descent and ascent portions.  ?  ?  ?COGNITION: ?           Overall cognitive status: Within functional limits for tasks assessed              ?            ?SENSATION: ?           Light touch: Appears intact ?           Proprioception: Appears intact ?  ?MUSCLE LENGTH: ?Bil hamstrings, adductors, hip IR  limited by 25% ?  ?POSTURE:  ?Rounded shoulders, posterior pelvic tilt ?  ?PALPATION: ?Internal Pelvic Floor TTP at Lt bulbocavernosus, ischiocavernosus, and increased tone noted throughout, worse at Lt.  ?  ?External Perineal Exam bil bulbocavernosus, suprapubic region with palpation  ?  ?Globally: pt had TTP at lower abdominal quadrants; decreased rib mobility with impaired breathing mechanics ?  ?  ?LUMBARAROM/PROM ?  ?Decreased bil lumbar side bending by 25% and flexion 25% ?  ?LE AROM/PROM: ?  ?Bil hips  WFL ?  ?LE MMT: ?  ?Rt hip grossly 4/5 with exception of abduction 3/5; Lt hip grossly 3/5 ?  ?PELVIC MMT:  ?  ?MMT   ?08/23/2021  ?Vaginal  3/5  ?Internal Anal Sphincter    ?External Anal Sphincter    ?Puborectalis    ?Diastasis Recti  one finger width above umbilicus tapered to  0.5 finger  width below, moderate density felt   ?(Blank rows = not tested) ?  ?  ?TONE: ?Increased throughout but more so at Lt side ?  ?PROLAPSE: ?Difficult to assess due to pt's pain and with palpation ?  ?  ?  ?  ?TODAY'S TREATMENT  ?EVAL: No treatment completed at end of evaluation and unfortunately pt arrived late and session limited due to time ?  ?08/29/2021 :  2x30s knee fallouts for adductor stretching; x10 diaphragmatic breathing with tactile cues; x10 tail wags in quad; 2x30 mini cobra with forearms support unable to tolerate bil elbow extension for press up; thoracic opener in side lying 2x30s each with modifications for less mobility in twist to decrease pain; child's pose x45s ?Pt given and educated more in detail on HEP and asked to review, PT demonstrated all exercises and pt completed one rep of any exercise we did not complete in session. Pt denied additional questions at end of session ? ?09/06/2021 in person interpreter present and pt consented to internal vaginal assessment this date. Pt did have TTP at external palpation at perineal body, external bil bulbocavernosus. Pt consented to internal progression of assessment, TTP at Lt bulbocavernosus, ischiocavernosus, and increased tone noted throughout, worse at Lt. Pt reported pain with palpation of superficial muscle layer and assessment not progressed due to this, gentle stretching applied in Lt and posterior direction and pt reported 5/10 pain, less pressure given and pt reported 3/10 pain. Pt reports sometimes she has pain with intercourse other times no pain. Pt reports until MD clears her she is not able to use lubricant but does report some dryness felt and thinks a lubricant would be helpful once cleared. After assessment, pt directed in TA activations in sitting with difficulty activating TA, attempting in hook lying continued difficulty, balloon breathing technique completed with VC and visual demonstration. Pt demonstrated improved activation and consistency with this technique and able to contract TA with this. Pt also given this updated in HEP. Pt also directed in x10 diaphragmatic breathing with PT providing manual assistance for rib mobility for exhale and inhale with pt reporting no pain with this. Pt would benefit from additional PT to further address deficits.   ?  ?  ?PATIENT EDUCATION:   ?Education details: Pt educated on HEP ?Person educated: Patient and interpreter present for language needs ?Education method: Explanation, Demonstration, Tactile cues, and Verbal cues ?Education comprehension: verbalized understanding and returned demonstration ?  ?  ?Speed: ?[id number]   ?  ?ASSESSMENT: ?  ?CLINICAL IMPRESSION: ?Patient reports has been having intermittent pain with intercourse, not consistent, no leakage at all, but does global swelling intermittently which causes pain and this limits her mobility. Pt consented to internal vaginal assessment, see details above for this. Pt had increased tone noted and pain with palpation. Pt did not tolerate deep muscle layer palpation due to pain at superficial layer therefore did not attempt. Pt also demonstrated decreased rib mobility and core activation at TA, time in session spent on this for improved mobility and decreased pain. Pt would benefit from additional PT to further address deficits.   ?  ?  ?  OBJECTIVE IMPAIRMENTS decreased coordination, decreased endurance, decreased mobility, decreased strength, increased fascial restrictions, increased muscle spasms, impaired tone, improper body mechanics, postural dysfunction, and pain.  ?  ?ACTIVITY LIMITATIONS community activity and caring for children with lifting or carrying   .  ?  ?PERSONAL FACTORS Fitness, Past/current experiences, Time since onset of injury/illness/exacerbation, and 1-2 comorbidities: x2 grade 4 tears with births and repeast perineoplasty  are also affecting patient's functional outcome.  ?  ?  ?REHAB POTENTIAL: Good ?  ?CLINICAL DECISION MAKING: Evolving/moderate complexity ?  ?EVALUATION COMPLEXITY: Moderate ?  ?  ?GOALS: ?Goals reviewed with patient? Yes ?  ?SHORT TERM GOALS: ?  ?STG Name Target Date Goal status  ?1 Pt to be I with HEP.  ?Baseline:  09/20/2021 INITIAL  ?2 Pt to report no more than 4/10 pain with standing at least 30 mins for improved ability to care for  children.  ?Baseline: <30 mins at a time with ~6/10 pain 09/20/2021 INITIAL  ?3   ?Baseline:      ?4   ?Baseline:      ?5   ?Baseline:      ?6   ?Baseline:      ?7   ?Baseline:      ?  ?LONG TERM GOALS:  ?  ?LTG N

## 2021-09-06 NOTE — Progress Notes (Signed)
[facility] ? ?Date of Visit: 09/06/2021 ? ?History of Present Illness: Kelsey Mcdaniel is a 26 y.o. female scheduled today for a post-operative visit.  ?? Surgery: s/p repeat perineoplasty after wound breakdown on 04/27/21. Original surgery was 04/10/21 perineoplasty and anal sphincteroplasty.  ? ?Denies vaginal pain but reports full body pain and swelling- cannot fit into her dresses. Having normal BM with miralax.  ? ? ?Medications: She has a current medication list which includes the following prescription(s): acetaminophen, diclofenac sodium, famotidine, ibuprofen, norethindrone, and polyethylene glycol powder.  ? ?Allergies: Patient has No Known Allergies.  ? ?Physical Exam: ?Vitals:  ? 09/06/21 1613  ?BP: 108/65  ?Pulse: 68  ? ? ? ?Pelvic Examination: Well healed perineal body and superficial skin. No sutures present. No tenderness with palpation.  ? ?--------------------------------------------------------- ? ?Assessment and Plan:  ?1. Wound disruption, post-op, skin, sequela   ? ? ?- Completely healed surgical incision ?- Continue daily miralax as needed. Refills sent last visit.  ?- Follow up with PCP regarding complaints of body pain/ swelling ? ?Follow up as needed ? ?Kelsey Beards, MD ? ? ?Time spent: I spent 20 minutes dedicated to the care of this patient on the date of this encounter to include pre-visit review of records, face-to-face time with the patient and post visit documentation. ? ? ?

## 2021-09-12 ENCOUNTER — Other Ambulatory Visit: Payer: Self-pay

## 2021-09-12 ENCOUNTER — Ambulatory Visit: Payer: Medicaid Other | Admitting: Physical Therapy

## 2021-09-12 ENCOUNTER — Encounter: Payer: Self-pay | Admitting: Physical Therapy

## 2021-09-12 DIAGNOSIS — M62838 Other muscle spasm: Secondary | ICD-10-CM

## 2021-09-12 DIAGNOSIS — M6281 Muscle weakness (generalized): Secondary | ICD-10-CM

## 2021-09-12 DIAGNOSIS — R293 Abnormal posture: Secondary | ICD-10-CM

## 2021-09-12 NOTE — Therapy (Signed)
?OUTPATIENT PHYSICAL THERAPY TREATMENT NOTE ? ? ?Patient Name: Kelsey Mcdaniel ?MRN: [id number] ?DOB:30-Jun-1995, 26 y.o., female ?Today's Date: 09/12/2021 ? ?PCP: Billey Co, MD ?REFERRING PROVIDER: Marguerita Beards, * ? ? PT End of Session - 09/12/21 1239   ? ? Visit Number 4   ? Date for PT Re-Evaluation 11/23/21   ? Authorization Type [email or web address]   ? Authorization - Number of Visits 27   ? PT Start Time 1234   ? PT Stop Time 1313   ? PT Time Calculation (min) 39 min   ? Activity Tolerance Patient tolerated treatment well;No increased pain   ? Behavior During Therapy [facility] for tasks assessed/performed   ? ?  ?  ? ?  ? ? ?Past Medical History:  ?Diagnosis Date  ? Cloacal malformation   ? History of pregnancy induced hypertension   ? ?Past Surgical History:  ?Procedure Laterality Date  ? PERINEOPLASTY N/A 04/10/2021  ? Procedure: PERINEOPLASTY;  Surgeon: Marguerita Beards, MD;  Location: [facility];  Service: Gynecology;  Laterality: N/A;  ? PERINEOPLASTY N/A 04/27/2021  ? Procedure: PERINEOPLASTY;  Surgeon: Marguerita Beards, MD;  Location: [facility];  Service: Gynecology;  Laterality: N/A;  total time requested is 1 hour  ? RECTOVAGINAL FISTULA CLOSURE  2021  ? ?Patient Active Problem List  ? Diagnosis Date Noted  ? Encounter for vaccination 07/31/2021  ? Neck strain 07/04/2021  ? Tension headache 06/09/2021  ? Heartburn 06/09/2021  ? Constipation 01/13/2021  ? Iron deficiency anemia 01/13/2021  ? Fourth degree laceration of perineum during delivery, postpartum 12/21/2020  ? History of gestational hypertension 06/30/2020  ? Encounter for health examination of refugee 06/02/2020  ? 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 ?REFERRING DIAG: M79.10 (ICD-10-CM) - Muscle pain ?REFERRING PROVIDER: Marguerita Beards, * ?THERAPY DIAG:  ?Muscle weakness (generalized) ?  ?Unspecified lack of coordination ?  ?Abnormal posture ?  ?PERTINENT HISTORY: prior recto-vaginal fistula ?s/p  repeat perineoplasty after wound breakdown on 04/27/21. Original surgery was 04/10/21 perineoplasty and anal sphincteroplasty.  ?  ?PRECAUTIONS: Postpartum (10 months and 3 years) ? ?SUBJECTIVE: Pt reports she has continued to have pain when sitting and though HEP is painful somewhat per pt, about 10 mins afterward her pain levels go much lower and she has less pain overall.  ? ?PAIN:  ?Are you having pain? Yes 6/10 and at 3/10 at end of session at perineum  ? ? ? ? ? ?OBJECTIVE:  ?  ?Functional squat assessed to have poor body mechanics and noted strength and breathing deficits during descent and ascent portions.  ?  ?  ?COGNITION: ?           Overall cognitive status: Within functional limits for tasks assessed              ?            ?SENSATION: ?           Light touch: Appears intact ?           Proprioception: Appears intact ?  ?MUSCLE LENGTH: ?Bil hamstrings, adductors, hip IR  limited by 25% ?  ?POSTURE:  ?Rounded shoulders, posterior pelvic tilt ?  ?PALPATION: ?Internal Pelvic Floor TTP at Lt bulbocavernosus, ischiocavernosus, and increased tone noted throughout, worse at Lt.  ?  ?External Perineal Exam bil bulbocavernosus, suprapubic region with palpation  ?  ?Globally: pt had TTP at lower abdominal quadrants; decreased rib mobility with impaired breathing mechanics ?  ?  ?  LUMBARAROM/PROM ?  ?Decreased bil lumbar side bending by 25% and flexion 25% ?  ?LE AROM/PROM: ?  ?Bil hips WFL ?  ?LE MMT: ?  ?Rt hip grossly 4/5 with exception of abduction 3/5; Lt hip grossly 3/5 ?  ?PELVIC MMT:  ?  ?MMT   ?08/23/2021  ?Vaginal  3/5  ?Internal Anal Sphincter    ?External Anal Sphincter    ?Puborectalis    ?Diastasis Recti  one finger width above umbilicus tapered to  0.5 finger  width below, moderate density felt   ?(Blank rows = not tested) ?  ?  ?TONE: ?Increased throughout but more so at Lt side ?  ?PROLAPSE: ?Difficult to assess due to pt's pain and with palpation ?  ?  ?  ?  ?TODAY'S TREATMENT  ?EVAL: No treatment  completed at end of evaluation and unfortunately pt arrived late and session limited due to time ?  ?08/29/2021 : 2x30s knee fallouts for adductor stretching; x10 diaphragmatic breathing with tactile cues; x10 tail wags in quad; 2x30 mini cobra with forearms support unable to tolerate bil elbow extension for press up; thoracic opener in side lying 2x30s each with modifications for less mobility in twist to decrease pain; child's pose x45s ?Pt given and educated more in detail on HEP and asked to review, PT demonstrated all exercises and pt completed one rep of any exercise we did not complete in session. Pt denied additional questions at end of session ? ?09/12/2021 in person interpreter present and pt consented to internal vaginal assessment this date. Pt did have TTP at external palpation at perineal body, external bil bulbocavernosus. Pt consented to internal progression of assessment, TTP at Lt bulbocavernosus, ischiocavernosus, and increased tone noted throughout, worse at Lt. Pt reported pain with palpation of superficial muscle layer and assessment not progressed due to this, gentle stretching applied in Lt and posterior direction and pt reported 5/10 pain, less pressure given and pt reported 3/10 pain. Pt reports sometimes she has pain with intercourse other times no pain. Pt reports until MD clears her she is not able to use lubricant but does report some dryness felt and thinks a lubricant would be helpful once cleared. After assessment, pt directed in TA activations in sitting with difficulty activating TA, attempting in hook lying continued difficulty, balloon breathing technique completed with VC and visual demonstration. Pt demonstrated improved activation and consistency with this technique and able to contract TA with this. Pt also given this updated in HEP. Pt also directed in x10 diaphragmatic breathing with PT providing manual assistance for rib mobility for exhale and inhale with pt reporting no  pain with this. Pt would benefit from additional PT to further address deficits.   ?  ? ?09/12/2021   ?Manual work: HL scar mobility at perineal body and surrounding tissue and proximal glutes with increased tone noted and decreased mobility at scar site. No bleeding noted or openings at scar site. Pt reported this greatly improved pain and no longer having pain when in sitting like prior to session, and overall pain decreased from 6/10 to 3/10. Increased time needed during session with need of interpreter throughout and explanation of all tasks.  ? ?PATIENT EDUCATION:  ?Education details: Pt educated on HEP ?Person educated: Patient and interpreter present for language needs ?Education method: Explanation, Demonstration, Tactile cues, and Verbal cues ?Education comprehension: verbalized understanding and returned demonstration ?  ?  ?HOME EXERCISE PROGRAM: ?[id number]   ?  ?ASSESSMENT: ?  ?CLINICAL IMPRESSION: ?Pt presents  to clinic with recent MD appt and reports this went well, no longer having stitches  ?  ?  ?OBJECTIVE IMPAIRMENTS decreased coordination, decreased endurance, decreased mobility, decreased strength, increased fascial restrictions, increased muscle spasms, impaired tone, improper body mechanics, postural dysfunction, and pain.  ?  ?ACTIVITY LIMITATIONS community activity and caring for children with lifting or carrying   .  ?  ?PERSONAL FACTORS Fitness, Past/current experiences, Time since onset of injury/illness/exacerbation, and 1-2 comorbidities: x2 grade 4 tears with births and repeast perineoplasty  are also affecting patient's functional outcome.  ?  ?  ?REHAB POTENTIAL: Good ?  ?CLINICAL DECISION MAKING: Evolving/moderate complexity ?  ?EVALUATION COMPLEXITY: Moderate ?  ?  ?GOALS: ?Goals reviewed with patient? Yes ?  ?SHORT TERM GOALS: ?  ?STG Name Target Date Goal status  ?1 Pt to be I with HEP.  ?Baseline:  09/20/2021 INITIAL  ?2 Pt to report no more than 4/10 pain with standing at least 30  mins for improved ability to care for children.  ?Baseline: <30 mins at a time with ~6/10 pain 09/20/2021 INITIAL  ?3   ?Baseline:      ?4   ?Baseline:      ?5   ?Baseline:      ?6   ?Baseline:      ?7   ?Ba

## 2021-09-14 NOTE — Patient Instructions (Addendum)
It was wonderful to see you today. ? ?Please bring ALL of your medications with you to every visit.  ? ?Today we talked about: ? ?- For your shin pain- I have prescribed naproxen twice daily and Voltaren gel as needed ? ? ?Thank you for choosing [facility].  ? ?Please call [phone] with any questions about today's appointment. ? ?Please be sure to schedule follow up at the front  desk before you leave today.  ? ?Please arrive at least 15 minutes prior to your scheduled appointments. ?  ?If you had blood work today, I will send you a MyChart message or a letter if results are normal. Otherwise, I will give you a call. ?  ?If you had a referral placed, they will call you to set up an appointment. Please give [place] a call if you don't hear back in the next 2 weeks. ?  ?If you need additional refills before your next appointment, please call your pharmacy first.  ? ?Burley Saver, MD  ?Family Medicine   ?

## 2021-09-14 NOTE — Progress Notes (Signed)
? ? ?  SUBJECTIVE:  ? ?CHIEF COMPLAINT / HPI:  ? ?Shin pain- for past week and a half, pain down anterior right shin. No calf pain or leg swelling. Not noticing as much with activity but when she lays down then notices. Hasn't tried anything for this yet.  ? ?Contraception- currently on POPs and breastfeeding. Having irregular bleeding, sometimes 3 menses during the month. Wanting to discuss other options for contraception. No history of clots or tobacco use, female cancers, migraine with aura. ? ?H/o fourth degree s/p repair- taking Miralax daily, needs a refill. ? ?OBJECTIVE:  ? ?BP 111/74   Pulse 84   Ht 5\' 2"  (1.575 m)   Wt 167 lb (75.8 kg)   LMP 09/09/2021 (Approximate)   SpO2 99%   BMI 30.54 kg/m?   ?General: A&O, NAD ?HEENT: No sign of trauma, EOM grossly intact ?Respiratory: normal WOB ?GI: non-distended  ?Extremities: no peripheral edema, right shin TTP along bone, no calf tenderness, no bruising/edema ecchymoses appreciated, no erythema, no lacerations ?Neuro: Normal gait, moves all four extremities appropriately ?Skin: no lesions/rashes visualized ?Psych: Appropriate mood and affect ? ?ASSESSMENT/PLAN:  ? ?Contraceptive management ?- counseled on different options, she is breastfeeding, no contraindications to estrogen therapy, discussed combined OCPs vs IUD, she would like to discuss with her husband and get back to me ? ?Fourth degree laceration of perineum during delivery, postpartum ?- healed, continue Miralax refilled today ? ?Shin splint of right lower extremity ?- discussed conservation measures, PRN voltaren gel, naproxen BID, discussed to not take naproxen with other NSAIDs like ibuprofen, no need for imaging at this time, no swelling or calf pain thus low suspicion for DVT ?  ? ? ?09/11/2021, MD ?[facility]  ? ?

## 2021-09-15 ENCOUNTER — Encounter: Payer: Self-pay | Admitting: Family Medicine

## 2021-09-15 ENCOUNTER — Ambulatory Visit: Payer: Medicaid Other | Admitting: Family Medicine

## 2021-09-15 ENCOUNTER — Other Ambulatory Visit: Payer: Self-pay

## 2021-09-15 VITALS — BP 111/74 | HR 84 | Ht 62.0 in | Wt 167.0 lb

## 2021-09-15 DIAGNOSIS — S86891A Other injury of other muscle(s) and tendon(s) at lower leg level, right leg, initial encounter: Secondary | ICD-10-CM | POA: Insufficient documentation

## 2021-09-15 DIAGNOSIS — N823 Fistula of vagina to large intestine: Secondary | ICD-10-CM

## 2021-09-15 DIAGNOSIS — Z3009 Encounter for other general counseling and advice on contraception: Secondary | ICD-10-CM | POA: Diagnosis not present

## 2021-09-15 DIAGNOSIS — Z309 Encounter for contraceptive management, unspecified: Secondary | ICD-10-CM | POA: Insufficient documentation

## 2021-09-15 MED ORDER — POLYETHYLENE GLYCOL 3350 17 GM/SCOOP PO POWD: 17.0000 g | Freq: Three times a day (TID) | ORAL | 11 refills | Status: DC

## 2021-09-15 MED ORDER — NAPROXEN 500 MG PO TABS: 500.0000 mg | ORAL_TABLET | Freq: Two times a day (BID) | ORAL | 1 refills | Status: AC

## 2021-09-15 NOTE — Assessment & Plan Note (Signed)
-   healed, continue Miralax refilled today ?

## 2021-09-15 NOTE — Assessment & Plan Note (Signed)
-   discussed conservation measures, PRN voltaren gel, naproxen BID, discussed to not take naproxen with other NSAIDs like ibuprofen, no need for imaging at this time, no swelling or calf pain thus low suspicion for DVT ?

## 2021-09-15 NOTE — Assessment & Plan Note (Signed)
-   counseled on different options, she is breastfeeding, no contraindications to estrogen therapy, discussed combined OCPs vs IUD, she would like to discuss with her husband and get back to me ?

## 2021-09-20 ENCOUNTER — Ambulatory Visit: Payer: Medicaid Other | Admitting: Physical Therapy

## 2021-09-20 DIAGNOSIS — M6281 Muscle weakness (generalized): Secondary | ICD-10-CM

## 2021-09-20 DIAGNOSIS — R279 Unspecified lack of coordination: Secondary | ICD-10-CM

## 2021-09-20 DIAGNOSIS — M62838 Other muscle spasm: Secondary | ICD-10-CM

## 2021-09-20 NOTE — Therapy (Signed)
?OUTPATIENT PHYSICAL THERAPY TREATMENT NOTE ? ? ?Patient Name: Kelsey Mcdaniel ?MRN: [id number] ?DOB:1995-10-02, 26 y.o., female ?Today's Date: 09/20/2021 ? ?PCP: Billey Co, MD ?REFERRING PROVIDER: Marguerita Beards, * ? ? PT End of Session - 09/20/21 1310   ? ? Visit Number 5   ? Date for PT Re-Evaluation 11/23/21   ? Authorization Type healthyblue   ? Authorization - Number of Visits 27   ? PT Start Time 1234   pt arrival time  ? PT Stop Time 1309   ? PT Time Calculation (min) 35 min   ? Activity Tolerance Patient tolerated treatment well;No increased pain   ? Behavior During Therapy [facility] for tasks assessed/performed   ? ?  ?  ? ?  ? ? ? ?Past Medical History:  ?Diagnosis Date  ? Cloacal malformation   ? History of pregnancy induced hypertension   ? ?Past Surgical History:  ?Procedure Laterality Date  ? PERINEOPLASTY N/A 04/10/2021  ? Procedure: PERINEOPLASTY;  Surgeon: Marguerita Beards, MD;  Location: [facility];  Service: Gynecology;  Laterality: N/A;  ? PERINEOPLASTY N/A 04/27/2021  ? Procedure: PERINEOPLASTY;  Surgeon: Marguerita Beards, MD;  Location: [facility];  Service: Gynecology;  Laterality: N/A;  total time requested is 1 hour  ? RECTOVAGINAL FISTULA CLOSURE  2021  ? ?Patient Active Problem List  ? Diagnosis Date Noted  ? Contraceptive management 09/15/2021  ? Shin splint of right lower extremity 09/15/2021  ? Encounter for vaccination 07/31/2021  ? Neck strain 07/04/2021  ? Tension headache 06/09/2021  ? Heartburn 06/09/2021  ? Constipation 01/13/2021  ? Iron deficiency anemia 01/13/2021  ? Fourth degree laceration of perineum during delivery, postpartum 12/21/2020  ? History of gestational hypertension 06/30/2020  ? Encounter for health examination of refugee 06/02/2020  ? 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 ?REFERRING DIAG: M79.10 (ICD-10-CM) - Muscle pain ?REFERRING PROVIDER: Marguerita Beards, * ?THERAPY DIAG:  ?Muscle weakness (generalized) ?   ?Unspecified lack of coordination ?  ?Abnormal posture ?  ?PERTINENT HISTORY: prior recto-vaginal fistula ?s/p repeat perineoplasty after wound breakdown on 04/27/21. Original surgery was 04/10/21 perineoplasty and anal sphincteroplasty.  ?  ?PRECAUTIONS: Postpartum (10 months and 3 years) ? ?SUBJECTIVE: Pt reports she has been resting a lot and this has helped lower pain a lot. Pt has been doing HEP 1-2x per day and overall pain has been much better.  ? ?PAIN:  ?Are you having pain? No  ? ? ? ? ? ?OBJECTIVE:  ?  ?Functional squat assessed to have poor body mechanics and noted strength and breathing deficits during descent and ascent portions.  ?  ?  ?COGNITION: ?           Overall cognitive status: Within functional limits for tasks assessed              ?            ?SENSATION: ?           Light touch: Appears intact ?           Proprioception: Appears intact ?  ?MUSCLE LENGTH: ?Bil hamstrings, adductors, hip IR  limited by 25% ?  ?POSTURE:  ?Rounded shoulders, posterior pelvic tilt ?  ?PALPATION: ?Internal Pelvic Floor TTP at Lt bulbocavernosus, ischiocavernosus, and increased tone noted throughout, worse at Lt.  ?  ?External Perineal Exam bil bulbocavernosus, suprapubic region with palpation  ?  ?Globally: pt had TTP at lower abdominal quadrants; decreased rib mobility with  impaired breathing mechanics ?  ?  ?LUMBARAROM/PROM ?  ?Decreased bil lumbar side bending by 25% and flexion 25% ?  ?LE AROM/PROM: ?  ?Bil hips WFL ?  ?LE MMT: ?  ?Rt hip grossly 4/5 with exception of abduction 3/5; Lt hip grossly 3/5 ?  ?PELVIC MMT:  ?  ?MMT   ?08/23/2021  ?Vaginal  3/5  ?Internal Anal Sphincter    ?External Anal Sphincter    ?Puborectalis    ?Diastasis Recti  one finger width above umbilicus tapered to  0.5 finger  width below, moderate density felt   ?(Blank rows = not tested) ?  ?  ?TONE: ?Increased throughout but more so at Lt side ?  ?PROLAPSE: ?Difficult to assess due to pt's pain and with palpation ?  ?  ?  ?  ?TODAY'S  TREATMENT  ?EVAL: No treatment completed at end of evaluation and unfortunately pt arrived late and session limited due to time ?  ?08/29/2021 : 2x30s knee fallouts for adductor stretching; x10 diaphragmatic breathing with tactile cues; x10 tail wags in quad; 2x30 mini cobra with forearms support unable to tolerate bil elbow extension for press up; thoracic opener in side lying 2x30s each with modifications for less mobility in twist to decrease pain; child's pose x45s ?Pt given and educated more in detail on HEP and asked to review, PT demonstrated all exercises and pt completed one rep of any exercise we did not complete in session. Pt denied additional questions at end of session ? ?2 in person interpreter present and pt consented to internal vaginal assessment this date. Pt did have TTP at external palpation at perineal body, external bil bulbocavernosus. Pt consented to internal progression of assessment, TTP at Lt bulbocavernosus, ischiocavernosus, and increased tone noted throughout, worse at Lt. Pt reported pain with palpation of superficial muscle layer and assessment not progressed due to this, gentle stretching applied in Lt and posterior direction and pt reported 5/10 pain, less pressure given and pt reported 3/10 pain. Pt reports sometimes she has pain with intercourse other times no pain. Pt reports until MD clears her she is not able to use lubricant but does report some dryness felt and thinks a lubricant would be helpful once cleared. After assessment, pt directed in TA activations in sitting with difficulty activating TA, attempting in hook lying continued difficulty, balloon breathing technique completed with VC and visual demonstration. Pt demonstrated improved activation and consistency with this technique and able to contract TA with this. Pt also given this updated in HEP. Pt also directed in x10 diaphragmatic breathing with PT providing manual assistance for rib mobility for exhale and inhale  with pt reporting no pain with this. Pt would benefit from additional PT to further address deficits.   ?  ? ?3 ?Manual work: hooklying  scar mobility at perineal body and surrounding tissue and proximal glutes with increased tone noted and decreased mobility at scar site. No bleeding noted or openings at scar site. Pt reported this greatly improved pain and no longer having pain when in sitting like prior to session, and overall pain decreased from 6/10 to 3/10. Increased time needed during session with need of interpreter throughout and explanation of all tasks.  ? ?09/20/2021 : ?Manual work at perineal area and proximal adductors with noted tightness in these areas. Pt on period and requested to leave underwear on with pad in place and manual work adjusted to this. Pt tolerated well and reported feeling much better at end of session.  Trigger point release completed with manual work and relaxation techniques cued during session.  ?Bil adductor stretch 3x1 mins also completed post manual work.  ? ?PATIENT EDUCATION:  ?Education details: Pt educated on HEP ?Person educated: Patient and interpreter present for language needs ?Education method: Explanation, Demonstration, Tactile cues, and Verbal cues ?Education comprehension: verbalized understanding and returned demonstration ?  ?  ?HOME EXERCISE PROGRAM: ?[id number]   ?  ?ASSESSMENT: ?  ?CLINICAL IMPRESSION: ?Pt presents to clinic reporting HEP and manual last session has lowered her pain a lot and she is having much less at home but has bene resting more as well. Pt denied pain this sess. Pt session focused on manual at perineal area and proximal adductors with pt having tightness throughout these areas and mild TTP with manual work but reported  she felt less tight at end of session. Pt would benefit from additional PT to further address deficits.   ?  ?  ?OBJECTIVE IMPAIRMENTS decreased coordination, decreased endurance, decreased mobility, decreased strength,  increased fascial restrictions, increased muscle spasms, impaired tone, improper body mechanics, postural dysfunction, and pain.  ?  ?ACTIVITY LIMITATIONS community activity and caring for children with lifting

## 2021-09-26 ENCOUNTER — Ambulatory Visit: Payer: Medicaid Other | Attending: Obstetrics and Gynecology | Admitting: Physical Therapy

## 2021-09-26 DIAGNOSIS — R279 Unspecified lack of coordination: Secondary | ICD-10-CM | POA: Insufficient documentation

## 2021-09-26 DIAGNOSIS — M6281 Muscle weakness (generalized): Secondary | ICD-10-CM | POA: Diagnosis present

## 2021-09-26 DIAGNOSIS — R293 Abnormal posture: Secondary | ICD-10-CM | POA: Diagnosis present

## 2021-09-26 DIAGNOSIS — M62838 Other muscle spasm: Secondary | ICD-10-CM | POA: Diagnosis present

## 2021-09-26 NOTE — Therapy (Signed)
?OUTPATIENT PHYSICAL THERAPY TREATMENT NOTE ? ? ?Patient Name: Kelsey Mcdaniel ?MRN: [id number] ?DOB:[birth year], 26 y.o., female ?Today's Date: 09/26/2021 ? ?PCP: Billey Co, MD ?REFERRING PROVIDER: Marguerita Beards, * ? ? PT End of Session - 09/26/21 1237   ? ? Visit Number 6   ? Date for PT Re-Evaluation 11/23/21   ? Authorization Type healthyblue   ? Authorization - Number of Visits 27   ? PT Start Time 1235   pt arrival time  ? PT Stop Time 1313   ? PT Time Calculation (min) 38 min   ? Activity Tolerance Patient tolerated treatment well;No increased pain   ? Behavior During Therapy [facility] for tasks assessed/performed   ? ?  ?  ? ?  ? ? ? ?Past Medical History:  ?Diagnosis Date  ? Cloacal malformation   ? History of pregnancy induced hypertension   ? ?Past Surgical History:  ?Procedure Laterality Date  ? PERINEOPLASTY N/A 04/10/2021  ? Procedure: PERINEOPLASTY;  Surgeon: Marguerita Beards, MD;  Location: [facility];  Service: Gynecology;  Laterality: N/A;  ? PERINEOPLASTY N/A 04/27/2021  ? Procedure: PERINEOPLASTY;  Surgeon: Marguerita Beards, MD;  Location: [facility];  Service: Gynecology;  Laterality: N/A;  total time requested is 1 hour  ? RECTOVAGINAL FISTULA CLOSURE  2021  ? ?Patient Active Problem List  ? Diagnosis Date Noted  ? Contraceptive management 09/15/2021  ? Shin splint of right lower extremity 09/15/2021  ? Encounter for vaccination 07/31/2021  ? Neck strain 07/04/2021  ? Tension headache 06/09/2021  ? Heartburn 06/09/2021  ? Constipation 01/13/2021  ? Iron deficiency anemia 01/13/2021  ? Fourth degree laceration of perineum during delivery, postpartum 12/21/2020  ? History of gestational hypertension 06/30/2020  ? Encounter for health examination of refugee 06/02/2020  ? 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 ?REFERRING DIAG: M79.10 (ICD-10-CM) - Muscle pain ?REFERRING PROVIDER: Marguerita Beards, * ?THERAPY DIAG:  ?Muscle weakness (generalized) ?   ?Unspecified lack of coordination ?  ?Abnormal posture ?  ?PERTINENT HISTORY: prior recto-vaginal fistula ?s/p repeat perineoplasty after wound breakdown on 04/27/21. Original surgery was 04/10/21 perineoplasty and anal sphincteroplasty.  ?  ?PRECAUTIONS: Postpartum (10 months and 3 years) ? ?SUBJECTIVE: Pt reports she has been having much less pain, no pain today, feels like stretching and massage at home has been helping a lot. Pt reports no pain with intercourse since last visit.  ? ?PAIN:  ?Are you having pain? No  ? ? ? ? ? ?OBJECTIVE:  ?  ?Functional squat assessed to have poor body mechanics and noted strength and breathing deficits during descent and ascent portions.  ?  ?  ?COGNITION: ?           Overall cognitive status: Within functional limits for tasks assessed              ?            ?SENSATION: ?           Light touch: Appears intact ?           Proprioception: Appears intact ?  ?MUSCLE LENGTH: ?Bil hamstrings, adductors, hip IR  limited by 25% ?  ?POSTURE:  ?Rounded shoulders, posterior pelvic tilt ?  ?PALPATION: ?Internal Pelvic Floor TTP at Lt bulbocavernosus, ischiocavernosus, and increased tone noted throughout, worse at Lt.  ?  ?External Perineal Exam bil bulbocavernosus, suprapubic region with palpation  ?  ?Globally: pt had TTP at lower abdominal quadrants; decreased rib  mobility with impaired breathing mechanics ?  ?  ?LUMBARAROM/PROM ?  ?Decreased bil lumbar side bending by 25% and flexion 25% ?  ?LE AROM/PROM: ?  ?Bil hips WFL ?  ?LE MMT: ?  ?Rt hip grossly 4/5 with exception of abduction 3/5; Lt hip grossly 3/5 ?  ?PELVIC MMT:  ?  ?MMT   ?08/23/2021  ?Vaginal  3/5  ?Internal Anal Sphincter    ?External Anal Sphincter    ?Puborectalis    ?Diastasis Recti  one finger width above umbilicus tapered to  0.5 finger  width below, moderate density felt   ?(Blank rows = not tested) ?  ?  ?TONE: ?Increased throughout but more so at Lt side ?  ?PROLAPSE: ?Difficult to assess due to pt's pain and with  palpation ?  ?  ?  ?  ?TODAY'S TREATMENT  ?EVAL: No treatment completed at end of evaluation and unfortunately pt arrived late and session limited due to time ?  ?08/29/2021 : 2x30s knee fallouts for adductor stretching; x10 diaphragmatic breathing with tactile cues; x10 tail wags in quad; 2x30 mini cobra with forearms support unable to tolerate bil elbow extension for press up; thoracic opener in side lying 2x30s each with modifications for less mobility in twist to decrease pain; child's pose x45s ?Pt given and educated more in detail on HEP and asked to review, PT demonstrated all exercises and pt completed one rep of any exercise we did not complete in session. Pt denied additional questions at end of session ? ?2  ?in person interpreter present and pt consented to internal vaginal assessment this date. Pt did have TTP at external palpation at perineal body, external bil bulbocavernosus. Pt consented to internal progression of assessment, TTP at Lt bulbocavernosus, ischiocavernosus, and increased tone noted throughout, worse at Lt. Pt reported pain with palpation of superficial muscle layer and assessment not progressed due to this, gentle stretching applied in Lt and posterior direction and pt reported 5/10 pain, less pressure given and pt reported 3/10 pain. Pt reports sometimes she has pain with intercourse other times no pain. Pt reports until MD clears her she is not able to use lubricant but does report some dryness felt and thinks a lubricant would be helpful once cleared. After assessment, pt directed in TA activations in sitting with difficulty activating TA, attempting in hook lying continued difficulty, balloon breathing technique completed with VC and visual demonstration. Pt demonstrated improved activation and consistency with this technique and able to contract TA with this. Pt also given this updated in HEP. Pt also directed in x10 diaphragmatic breathing with PT providing manual assistance for rib  mobility for exhale and inhale with pt reporting no pain with this. Pt would benefit from additional PT to further address deficits.   ?  ? ?3 ?Manual work: hooklying  scar mobility at perineal body and surrounding tissue and proximal glutes with increased tone noted and decreased mobility at scar site. No bleeding noted or openings at scar site. Pt reported this greatly improved pain and no longer having pain when in sitting like prior to session, and overall pain decreased from 6/10 to 3/10. Increased time needed during session with need of interpreter throughout and explanation of all tasks.  ? ?09/20/2021 : ?Manual work at perineal area and proximal adductors with noted tightness in these areas. Pt on period and requested to leave underwear on with pad in place and manual work adjusted to this. Pt tolerated well and reported feeling much better at  end of session. Trigger point release completed with manual work and relaxation techniques cued during session.  ?Bil adductor stretch 3x1 mins also completed post manual work.  ? ?09/26/2021: ? Manual work at [employer] lower abdominal region with fascial restrictions noted in this region and at umbilicus in downward and in downward/Rt directions. Pt reported no pain with palpation and demonstrated no restrictions post manual work here ?Manual work externally at [employer] triangle for pelvic floor relaxation and improved pelvic floor mobility, gentle stretching also provided at perineal body, proximal glutes and adductors, scar site. Tightness and increased tone noted in these sites but improved compared to previous session and pt reported less pain with this compared to last session as well.  ?Foam rolling at bil gluteals x10 and thoracic  extension 4x30s ?Thoracic open books on foam roller x10 ? ?PATIENT EDUCATION:  ?Education details: Pt educated on HEP and posture ?Person educated: Patient and interpreter present for language needs ?Education method: Explanation, Demonstration,  Tactile cues, and Verbal cues ?Education comprehension: verbalized understanding and returned demonstration ?  ?  ?HOME EXERCISE PROGRAM: ?[id number]   ?  ?ASSESSMENT: ?  ?CLINICAL IMPRESSION: ?Pt presents to c

## 2021-10-04 ENCOUNTER — Ambulatory Visit: Payer: Medicaid Other | Admitting: Physical Therapy

## 2021-10-04 DIAGNOSIS — M6281 Muscle weakness (generalized): Secondary | ICD-10-CM

## 2021-10-04 DIAGNOSIS — R293 Abnormal posture: Secondary | ICD-10-CM

## 2021-10-04 DIAGNOSIS — M62838 Other muscle spasm: Secondary | ICD-10-CM

## 2021-10-04 NOTE — Therapy (Signed)
?OUTPATIENT PHYSICAL THERAPY TREATMENT NOTE ? ? ?Patient Name: Kelsey Mcdaniel ?MRN: [id number] ?DOB:04-29-1996, 26 y.o., female ?Today's Date: 10/04/2021 ? ?PCP: Billey Co, MD ?REFERRING PROVIDER: Marguerita Beards, * ? ? PT End of Session - 10/04/21 1236   ? ? Visit Number 7   ? Date for PT Re-Evaluation 11/23/21   ? Authorization Type healthyblue   ? Authorization - Number of Visits 27   ? PT Start Time 1232   ? PT Stop Time 1310   ? PT Time Calculation (min) 38 min   ? Activity Tolerance Patient tolerated treatment well;No increased pain   ? Behavior During Therapy [facility] for tasks assessed/performed   ? ?  ?  ? ?  ? ? ? ?Past Medical History:  ?Diagnosis Date  ? Cloacal malformation   ? History of pregnancy induced hypertension   ? ?Past Surgical History:  ?Procedure Laterality Date  ? PERINEOPLASTY N/A 04/10/2021  ? Procedure: PERINEOPLASTY;  Surgeon: Marguerita Beards, MD;  Location: [facility];  Service: Gynecology;  Laterality: N/A;  ? PERINEOPLASTY N/A 04/27/2021  ? Procedure: PERINEOPLASTY;  Surgeon: Marguerita Beards, MD;  Location: [facility];  Service: Gynecology;  Laterality: N/A;  total time requested is 1 hour  ? RECTOVAGINAL FISTULA CLOSURE  2021  ? ?Patient Active Problem List  ? Diagnosis Date Noted  ? Contraceptive management 09/15/2021  ? Shin splint of right lower extremity 09/15/2021  ? Encounter for vaccination 07/31/2021  ? Neck strain 07/04/2021  ? Tension headache 06/09/2021  ? Heartburn 06/09/2021  ? Constipation 01/13/2021  ? Iron deficiency anemia 01/13/2021  ? Fourth degree laceration of perineum during delivery, postpartum 12/21/2020  ? History of gestational hypertension 06/30/2020  ? Encounter for health examination of refugee 06/02/2020  ? 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 ?REFERRING DIAG: M79.10 (ICD-10-CM) - Muscle pain ?REFERRING PROVIDER: Marguerita Beards, * ?THERAPY DIAG:  ?Muscle weakness (generalized) ?  ?Unspecified lack of  coordination ?  ?Abnormal posture ?  ?PERTINENT HISTORY: prior recto-vaginal fistula ?s/p repeat perineoplasty after wound breakdown on 04/27/21. Original surgery was 04/10/21 perineoplasty and anal sphincteroplasty.  ?  ?PRECAUTIONS: Postpartum (10 months and 3 years) ? ?SUBJECTIVE: Pt reports she has had no pain since last visit, no pain with intercourse either. Pt reports she does continue to do HEP once per day, no longer having pain with those at all.  ? ?PAIN:  ?Are you having pain? No  ? ? ? ? ? ?OBJECTIVE:  ?  ?Functional squat assessed to have poor body mechanics and noted strength and breathing deficits during descent and ascent portions.  ?  ?  ?COGNITION: ?           Overall cognitive status: Within functional limits for tasks assessed              ?            ?SENSATION: ?           Light touch: Appears intact ?           Proprioception: Appears intact ?  ?MUSCLE LENGTH: ?Bil hamstrings, adductors, hip IR  limited by 25% ?  ?POSTURE:  ?Rounded shoulders, posterior pelvic tilt ?  ?PALPATION: ?Internal Pelvic Floor TTP at Lt bulbocavernosus, ischiocavernosus, and increased tone noted throughout, worse at Lt.  ?  ?External Perineal Exam bil bulbocavernosus, suprapubic region with palpation  ?  ?Globally: pt had TTP at lower abdominal quadrants; decreased rib mobility with impaired  breathing mechanics ?  ?  ?LUMBARAROM/PROM ?  ?Decreased bil lumbar side bending by 25% and flexion 25% ?  ?LE AROM/PROM: ?  ?Bil hips WFL ?  ?LE MMT: ?  ?Rt hip grossly 4/5 with exception of abduction 3/5; Lt hip grossly 3/5 ?  ?PELVIC MMT:  ?  ?MMT   ?08/23/2021 10/04/2021   ?Vaginal  3/5 4/5; isometric 9s; 4 reps  ?Internal Anal Sphincter     ?External Anal Sphincter     ?Puborectalis     ?Diastasis Recti  one finger width above umbilicus tapered to  0.5 finger  width below, moderate density felt    ?(Blank rows = not tested) ?  ?  ?TONE: ?Increased throughout but more so at Lt side ?  ?PROLAPSE: ?Difficult to assess due to pt's  pain and with palpation ?  ?  ?  ?  ?TODAY'S TREATMENT  ?EVAL: No treatment completed at end of evaluation and unfortunately pt arrived late and session limited due to time ?  ?08/29/2021 : 2x30s knee fallouts for adductor stretching; x10 diaphragmatic breathing with tactile cues; x10 tail wags in quad; 2x30 mini cobra with forearms support unable to tolerate bil elbow extension for press up; thoracic opener in side lying 2x30s each with modifications for less mobility in twist to decrease pain; child's pose x45s ?Pt given and educated more in detail on HEP and asked to review, PT demonstrated all exercises and pt completed one rep of any exercise we did not complete in session. Pt denied additional questions at end of session ? ?2  ?in person interpreter present and pt consented to internal vaginal assessment this date. Pt did have TTP at external palpation at perineal body, external bil bulbocavernosus. Pt consented to internal progression of assessment, TTP at Lt bulbocavernosus, ischiocavernosus, and increased tone noted throughout, worse at Lt. Pt reported pain with palpation of superficial muscle layer and assessment not progressed due to this, gentle stretching applied in Lt and posterior direction and pt reported 5/10 pain, less pressure given and pt reported 3/10 pain. Pt reports sometimes she has pain with intercourse other times no pain. Pt reports until MD clears her she is not able to use lubricant but does report some dryness felt and thinks a lubricant would be helpful once cleared. After assessment, pt directed in TA activations in sitting with difficulty activating TA, attempting in hook lying continued difficulty, balloon breathing technique completed with VC and visual demonstration. Pt demonstrated improved activation and consistency with this technique and able to contract TA with this. Pt also given this updated in HEP. Pt also directed in x10 diaphragmatic breathing with PT providing manual  assistance for rib mobility for exhale and inhale with pt reporting no pain with this. Pt would benefit from additional PT to further address deficits.   ?  ? ?3 ?Manual work: hooklying  scar mobility at perineal body and surrounding tissue and proximal glutes with increased tone noted and decreased mobility at scar site. No bleeding noted or openings at scar site. Pt reported this greatly improved pain and no longer having pain when in sitting like prior to session, and overall pain decreased from 6/10 to 3/10. Increased time needed during session with need of interpreter throughout and explanation of all tasks.  ? ?09/20/2021 : ?Manual work at perineal area and proximal adductors with noted tightness in these areas. Pt on period and requested to leave underwear on with pad in place and manual work adjusted to this. Pt  tolerated well and reported feeling much better at end of session. Trigger point release completed with manual work and relaxation techniques cued during session.  ?Bil adductor stretch 3x1 mins also completed post manual work.  ? ?09/26/2021: ? Manual work at [employer] lower abdominal region with fascial restrictions noted in this region and at umbilicus in downward and in downward/Rt directions. Pt reported no pain with palpation and demonstrated no restrictions post manual work here ?Manual work externally at [employer] triangle for pelvic floor relaxation and improved pelvic floor mobility, gentle stretching also provided at perineal body, proximal glutes and adductors, scar site. Tightness and increased tone noted in these sites but improved compared to previous session and pt reported less pain with this compared to last session as well.  ?Foam rolling at bil gluteals x10 and thoracic  extension 4x30s ?Thoracic open books on foam roller x10 ? ?10/04/2021: ?Pt educated on voiding mechanics, breathing mechanics ?Manual work with pt consent for medial gluteals and proximal adductors, external and internal  bulbocavernosus and perineal body in downward direction. Pt mobility much better today and pt tolerating more with less pain this session. Pt does still have decreased mobility at scar site but improving minimal pai

## 2021-10-10 ENCOUNTER — Ambulatory Visit: Payer: Medicaid Other | Admitting: Physical Therapy

## 2021-10-10 DIAGNOSIS — R293 Abnormal posture: Secondary | ICD-10-CM

## 2021-10-10 DIAGNOSIS — M62838 Other muscle spasm: Secondary | ICD-10-CM

## 2021-10-10 DIAGNOSIS — M6281 Muscle weakness (generalized): Secondary | ICD-10-CM | POA: Diagnosis not present

## 2021-10-10 NOTE — Therapy (Addendum)
OUTPATIENT PHYSICAL THERAPY TREATMENT NOTE   Patient Name: Kelsey Mcdaniel MRN: [id number] DOB:08/12/1995, 26 y.o., female Today's Date: 10/10/2021  PCP: Lenoria Chime, MD REFERRING PROVIDER: Jaquita Folds, *   PT End of Session - 10/10/21 1236     Visit Number 8    Date for PT Re-Evaluation 11/23/21    Authorization Type [employer]    Authorization - Number of Visits 27    PT Start Time 1232    PT Stop Time 1310    PT Time Calculation (min) 38 min    Activity Tolerance Patient tolerated treatment well    Behavior During Therapy WFL for tasks assessed/performed              Past Medical History:  Diagnosis Date   Cloacal malformation    History of pregnancy induced hypertension    Past Surgical History:  Procedure Laterality Date   PERINEOPLASTY N/A 04/10/2021   Procedure: PERINEOPLASTY;  Surgeon: Jaquita Folds, MD;  Location: [facility];  Service: Gynecology;  Laterality: N/A;   PERINEOPLASTY N/A 04/27/2021   Procedure: PERINEOPLASTY;  Surgeon: Jaquita Folds, MD;  Location: [facility];  Service: Gynecology;  Laterality: N/A;  total time requested is 1 hour   RECTOVAGINAL FISTULA CLOSURE  2021   Patient Active Problem List   Diagnosis Date Noted   Contraceptive management 09/15/2021   Shin splint of right lower extremity 09/15/2021   Encounter for vaccination 07/31/2021   Neck strain 07/04/2021   Tension headache 06/09/2021   Heartburn 06/09/2021   Constipation 01/13/2021   Iron deficiency anemia 01/13/2021   Fourth degree laceration of perineum during delivery, postpartum 12/21/2020   History of gestational hypertension 06/30/2020   Encounter for health examination of refugee 06/02/2020   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 Original surgery was 04/10/21 perineoplasty and anal sphincteroplasty.    PRECAUTIONS: Postpartum (10 months and 3 years)  SUBJECTIVE: Pt reports she has had no pain since last visit, but does have mild lower abdominal/anterior pelvic pain.   PAIN:  Are you having pain? No       OBJECTIVE:    Functional squat assessed to have poor body mechanics and noted strength and breathing deficits during descent and ascent portions.      COGNITION:            Overall cognitive status: Within functional limits for tasks assessed                          SENSATION:            Light touch: Appears intact            Proprioception: Appears intact   MUSCLE LENGTH: Bil hamstrings, adductors, hip IR  limited by 25%   POSTURE:  Rounded shoulders, posterior pelvic tilt   PALPATION: Internal Pelvic Floor TTP at Lt bulbocavernosus, ischiocavernosus, and increased tone noted throughout, worse at Lt.    External Perineal Exam bil bulbocavernosus, suprapubic region with palpation    Globally: pt had TTP at lower abdominal quadrants; decreased rib mobility with impaired breathing mechanics     LUMBARAROM/PROM   Decreased bil lumbar side bending by 25% and flexion  25%   LE AROM/PROM:   Bil hips WFL   LE MMT:   Rt hip grossly 4/5 with exception of abduction 3/5; Lt hip grossly 3/5   PELVIC MMT:    MMT   08/23/2021 10/04/2021   Vaginal  3/5 4/5; isometric 9s; 4 reps  Internal Anal Sphincter     External Anal Sphincter     Puborectalis     Diastasis Recti  one finger width above umbilicus tapered to  0.5 finger  width below, moderate density felt    (Blank rows = not tested)     TONE: Increased throughout but more so at Lt side   PROLAPSE: Difficult to assess due to pt's pain and with palpation         TODAY'S TREATMENT  EVAL: No treatment completed  at end of evaluation and unfortunately pt arrived late and session limited due to time   08/29/2021 : 2x30s knee fallouts for adductor stretching; x10 diaphragmatic breathing with tactile cues; x10 tail wags in quad; 2x30 mini cobra with forearms support unable to tolerate bil elbow extension for press up; thoracic opener in side lying 2x30s each with modifications for less mobility in twist to decrease pain; child's pose x45s Pt given and educated more in detail on HEP and asked to review, PT demonstrated all exercises and pt completed one rep of any exercise we did not complete in session. Pt denied additional questions at end of session  2  in person interpreter present and pt consented to internal vaginal assessment this date. Pt did have TTP at external palpation at perineal body, external bil bulbocavernosus. Pt consented to internal progression of assessment, TTP at Lt bulbocavernosus, ischiocavernosus, and increased tone noted throughout, worse at Lt. Pt reported pain with palpation of superficial muscle layer and assessment not progressed due to this, gentle stretching applied in Lt and posterior direction and pt reported 5/10 pain, less pressure given and pt reported 3/10 pain. Pt reports sometimes she has pain with intercourse other times no pain. Pt reports until MD clears her she is not able to use lubricant but does report some dryness felt and thinks a lubricant would be helpful once cleared. After assessment, pt directed in TA activations in sitting with difficulty activating TA, attempting in hook lying continued difficulty, balloon breathing technique completed with VC and visual demonstration. Pt demonstrated improved activation and consistency with this technique and able to contract TA with this. Pt also given this updated in HEP. Pt also directed in x10 diaphragmatic breathing with PT providing manual assistance for rib mobility for exhale and inhale with pt reporting no pain with this. Pt  would benefit from additional PT to further address deficits.      3 Manual work: hooklying  scar mobility at perineal body and surrounding tissue and proximal glutes with increased tone noted and decreased mobility at scar site. No bleeding noted or openings at scar site. Pt reported this greatly improved pain and no longer having pain when in sitting like prior to session, and overall pain decreased from 6/10 to 3/10. Increased time needed during session with need of interpreter throughout and explanation of all tasks.   09/20/2021 : Manual work at perineal area and proximal adductors with noted tightness in these areas. Pt on period and requested to leave underwear on with pad in place and manual work adjusted to this. Pt tolerated well and reported feeling much better at end of session. Trigger point release completed with manual work  and relaxation techniques cued during session.  Bil adductor stretch 3x1 mins also completed post manual work.   09/26/2021:  Manual work at [employer] lower abdominal region with fascial restrictions noted in this region and at umbilicus in downward and in downward/Rt directions. Pt reported no pain with palpation and demonstrated no restrictions post manual work here Manual work externally at [employer] triangle for pelvic floor relaxation and improved pelvic floor mobility, gentle stretching also provided at perineal body, proximal glutes and adductors, scar site. Tightness and increased tone noted in these sites but improved compared to previous session and pt reported less pain with this compared to last session as well.  Foam rolling at bil gluteals x10 and thoracic  extension 4x30s Thoracic open books on foam roller x10  10/04/2021: Pt educated on voiding mechanics, breathing mechanics Manual work with pt consent for medial gluteals and proximal adductors, external and internal bulbocavernosus and perineal body in downward direction. Pt mobility much better today and pt  tolerating more with less pain this session. Pt does still have decreased mobility at scar site but improving minimal pain with gentles stretching and no pain outside of this.  Pt also consented to internal vaginal assessment this date as well and findings above. Pt denied pain with this, did need VC for coordination of breathing with pelvic floor mobility but quickly improved.   10/10/2021:  Manual work at lower abdomen for improved tissue mobility and decreased pt pain as this was only area of pain today. 2x10 TA activations in hooklying with exhale and balloon breathing to improve technique 2x10 ball squeezes with breathing mechanics cued Piriformis stretch 2x30s X10 knees to chest X10 lower back rotation X10 same knees/arm press with ball   PATIENT EDUCATION:  Education details: Pt educated on HEP and posture Person educated: Patient and interpreter present for language needs Education method: Explanation, Demonstration, Tactile cues, and Verbal cues Education comprehension: verbalized understanding and returned demonstration     HOME EXERCISE PROGRAM: [id number]     ASSESSMENT:   CLINICAL IMPRESSION: Pt presents to clinic continues to report improvmeent with symptoms overall and having much less pain. Session today focused on core strengthening with pt having difficulty contraction abdominals and needs moderate cues. Pt would benefit from additional PT to further address deficits.       OBJECTIVE IMPAIRMENTS decreased coordination, decreased endurance, decreased mobility, decreased strength, increased fascial restrictions, increased muscle spasms, impaired tone, improper body mechanics, postural dysfunction, and pain.    ACTIVITY LIMITATIONS community activity and caring for children with lifting or carrying   .    PERSONAL FACTORS Fitness, Past/current experiences, Time since onset of injury/illness/exacerbation, and 1-2 comorbidities: x2 grade 4 tears with births and repeast  perineoplasty  are also affecting patient's functional outcome.      REHAB POTENTIAL: Good   CLINICAL DECISION MAKING: Evolving/moderate complexity   EVALUATION COMPLEXITY: Moderate     GOALS: Goals reviewed with patient? Yes   SHORT TERM GOALS:   STG Name Target Date Goal status  1 Pt to be I with HEP.  Baseline:  09/20/2021 INITIAL  2 Pt to report no more than 4/10 pain with standing at least 30 mins for improved ability to care for children.  Baseline: <30 mins at a time with ~6/10 pain 09/20/2021 INITIAL  3   Baseline:      4   Baseline:      5   Baseline:      6   Baseline:  7   Baseline:        LONG TERM GOALS:    LTG Name Target Date Goal status  1 Pt to be I with advanced HEP.   Baseline: given at eval 11/23/2021 INITIAL  2 Pt to demonstrate at least 5/5 bil hip strength for improved pelvic stability and functional squats without leakage.  Baseline: 3/5 Lt LE 11/23/2021 INITIAL  3 Pt to report no more than 4/10 pain with standing at least 1 hour for improved ability to care for children.  Baseline:<30 mins 6/10 pain 11/23/2021 INITIAL  4 Pt to report no straining for Bms, recall good voiding mechanics at least 90% of the time Baseline: 11/23/2021 INITIAL  5   Baseline:      6   Baseline:      7   Baseline:        PLAN: PT FREQUENCY: 1x/week   PT DURATION:  8 sessions   PLANNED INTERVENTIONS: Therapeutic exercises, Therapeutic activity, Neuromuscular re-education, Gait training, Patient/Family education, Joint mobilization, Aquatic Therapy, Cryotherapy, Moist heat, Manual lymph drainage, scar mobilization, Taping, and Manual therapy   PLAN FOR NEXT SESSION: breathing and core coordination and strengthening   Stacy Gardner, PT, DPT 04/18/231:14 PM   PHYSICAL THERAPY DISCHARGE SUMMARY  Visits from Start of Care: 8  Current functional level related to goals / functional outcomes: Unable to formally reassess, pt not returned since last session    Remaining deficits: Unable to formally reassess pt not returned since last session   Education / Equipment: HEP   Patient agrees to discharge. Patient goals were partially met. Patient is being discharged due to not returning since the last visit. Thank you for the referral.    Stacy Gardner, PT, DPT 02/14/2309:09 AM

## 2021-10-18 ENCOUNTER — Ambulatory Visit: Payer: Medicaid Other | Admitting: Physical Therapy

## 2021-10-24 ENCOUNTER — Other Ambulatory Visit: Payer: Self-pay

## 2021-10-24 ENCOUNTER — Emergency Department (HOSPITAL_COMMUNITY)
Admission: EM | Admit: 2021-10-24 | Discharge: 2021-10-24 | Disposition: A | Discharge status: Home / Self Care | Payer: Medicaid Other | Attending: Emergency Medicine | Admitting: Emergency Medicine

## 2021-10-24 ENCOUNTER — Encounter (HOSPITAL_COMMUNITY): Payer: Self-pay | Admitting: Emergency Medicine

## 2021-10-24 DIAGNOSIS — W01198A Fall on same level from slipping, tripping and stumbling with subsequent striking against other object, initial encounter: Secondary | ICD-10-CM | POA: Diagnosis not present

## 2021-10-24 DIAGNOSIS — S0990XA Unspecified injury of head, initial encounter: Secondary | ICD-10-CM | POA: Diagnosis present

## 2021-10-24 DIAGNOSIS — S0101XA Laceration without foreign body of scalp, initial encounter: Secondary | ICD-10-CM | POA: Insufficient documentation

## 2021-10-24 MED ORDER — IBUPROFEN 800 MG PO TABS: 800.0000 mg | ORAL_TABLET | Freq: Three times a day (TID) | ORAL | 0 refills | Status: DC

## 2021-10-24 MED ORDER — LIDOCAINE-EPINEPHRINE-TETRACAINE (LET) TOPICAL GEL
3.0000 mL | Freq: Once | TOPICAL | Status: AC
  Administered 2021-10-24: 3 mL via TOPICAL
  Filled 2021-10-24: qty 3

## 2021-10-24 MED ORDER — OXYCODONE-ACETAMINOPHEN 5-325 MG PO TABS
1.0000 | ORAL_TABLET | Freq: Once | ORAL | Status: AC
  Administered 2021-10-24: 1 via ORAL
  Filled 2021-10-24: qty 1

## 2021-10-24 NOTE — ED Triage Notes (Signed)
Pt presents with fall, no LOC.  Pain to top of head.  Trip and fall.  Interpreter used. [place] ?

## 2021-10-24 NOTE — ED Provider Triage Note (Signed)
Emergency Medicine Provider Triage Evaluation Note ? ?Kelsey Mcdaniel , a 26 y.o. female  was evaluated in triage.  Pt complains of head pain following injury 2-3 hours ago.  Was standing, slipped and hit her head on part of a bed.  Ground-level mechanical fall.  Noticed minimal bleeding after the incident.  Denied loss of consciousness.  Denies vision changes, neck stiffness, nausea or vomiting.  No recent or previous head traumas.  Medications include MiraLAX and birth control.  Previous surgery of C-section a few months ago. ? ?Review of Systems  ?Positive: As above ?Negative: As above ? ?Physical Exam  ?BP 119/86   Pulse 89   Temp 98.2 ?F (36.8 ?C) (Oral)   Resp 18   SpO2 98%  ?Gen:   Awake, no distress   ?Resp:  Normal effort  ?MSK:   Moves extremities without difficulty  ?Other:  Mild 1.5 cm laceration on the top of the scalp with mild slow bleed.  Mild tenderness around the area.  No other lacerations, ecchymosis, bleeding, injuries. ? ?Medical Decision Making  ?Medically screening exam initiated at 4:28 PM.  Appropriate orders placed.  Iszabella Delosreyes was informed that the remainder of the evaluation will be completed by another provider, this initial triage assessment does not replace that evaluation, and the importance of remaining in the ED until their evaluation is complete. ?  ?Cecil Cobbs, PA-C ?10/24/21 1639 ? ?

## 2021-10-24 NOTE — Discharge Instructions (Addendum)
You were seen in the emergency department today for a laceration to your head.  We placed 3 staples.  He will need to return in 7 days to the emergency department to have them removed.  In the meantime please do not scrub the area.  He may have warm water and soap trickle over the area.  Please return for any worsening symptoms such as infection, vomiting or visual changes. ?

## 2021-10-24 NOTE — ED Provider Notes (Signed)
?[facility] EMERGENCY DEPARTMENT ?Provider Note ? ? ?CSN: [id number] ?Arrival date & time: 10/24/21  1429 ? ?  ? ?History ? ?Chief Complaint  ?Patient presents with  ? Fall  ? ? ?Myan Locatelli is a 26 y.o. female. With no pertinent past medical history who presents to the emergency department with fall. ? ?States around 1 PM today she fell from standing today after she slipped and hit her head on the bed.  Denies loss of consciousness.  Denies any nausea, vomiting or visual changes since the incident.  Denies any neck pain or stiffness.  Not anticoagulated.  States that last tetanus shot was 2 months ago. ?Professional interpreter used for care.  ? ? ?Fall ? ? ?  ? ?Home Medications ?Prior to Admission medications   ?Medication Sig Start Date End Date Taking? Authorizing Provider  ?diclofenac Sodium (VOLTAREN) 1 % GEL Apply 4 g topically 4 (four) times daily as needed. ?Patient not taking: Reported on 08/23/2021 07/04/21   Billey Co, MD  ?norethindrone (MICRONOR) 0.35 MG tablet Take 1 tablet (0.35 mg total) by mouth daily. 07/04/21 10/02/21  Billey Co, MD  ?polyethylene glycol powder (GLYCOLAX/MIRALAX) 17 GM/SCOOP powder Take 17 g by mouth in the morning, at noon, and at bedtime. 09/15/21   Billey Co, MD  ?   ? ?Allergies    ?Patient has no known allergies.   ? ?Review of Systems   ?Review of Systems  ?Skin:  Positive for wound.  ?All other systems reviewed and are negative. ? ?Physical Exam ?Updated Vital Signs ?BP 119/86   Pulse 89   Temp 98.2 ?F (36.8 ?C) (Oral)   Resp 18   SpO2 98%  ?Physical Exam ?Vitals and nursing note reviewed.  ?Constitutional:   ?   General: She is not in acute distress. ?   Appearance: Normal appearance. She is normal weight. She is not ill-appearing or toxic-appearing.  ?HENT:  ?   Head: Normocephalic.  ?   Mouth/Throat:  ?   Mouth: Mucous membranes are moist.  ?   Pharynx: Oropharynx is clear.  ?Eyes:  ?   General: No scleral icterus. ?   Extraocular  Movements: Extraocular movements intact.  ?Cardiovascular:  ?   Rate and Rhythm: Normal rate and regular rhythm.  ?Pulmonary:  ?   Effort: Pulmonary effort is normal.  ?   Breath sounds: Normal breath sounds.  ?Abdominal:  ?   General: Bowel sounds are normal.  ?   Palpations: Abdomen is soft.  ?Musculoskeletal:     ?   General: Normal range of motion.  ?   Cervical back: Neck supple. No rigidity or tenderness.  ?Skin: ?   General: Skin is warm and dry.  ?   Capillary Refill: Capillary refill takes less than 2 seconds.  ?   Comments: 1.5 cm laceration to the parietal scalp  ?Neurological:  ?   General: No focal deficit present.  ?   Mental Status: She is alert and oriented to person, place, and time. Mental status is at baseline.  ?Psychiatric:     ?   Mood and Affect: Mood normal.     ?   Behavior: Behavior normal.     ?   Thought Content: Thought content normal.     ?   Judgment: Judgment normal.  ? ? ?ED Results / Procedures / Treatments   ?Labs ?(all labs ordered are listed, but only abnormal results are displayed) ?Labs Reviewed - No  data to display ? ?EKG ?None ? ?Radiology ?No results found. ? ?Procedures ?Marland Kitchen.Laceration Repair ? ?Date/Time: 10/24/2021 7:48 PM ?Performed by: Cristopher Peru, PA-C ?Authorized by: Cristopher Peru, PA-C  ? ?Consent:  ?  Consent obtained:  Verbal ?  Consent given by:  Patient ?  Risks, benefits, and alternatives were discussed: yes   ?  Risks discussed:  Infection, pain, need for additional repair, poor cosmetic result and poor wound healing ?  Alternatives discussed:  No treatment ?Universal protocol:  ?  Procedure explained and questions answered to patient or proxy's satisfaction: yes   ?  Relevant documents present and verified: yes   ?  Test results available: yes   ?  Required blood products, implants, devices, and special equipment available: yes   ?  Site/side marked: yes   ?  Immediately prior to procedure, a time out was called: yes   ?  Patient identity confirmed:   Verbally with patient ?Anesthesia:  ?  Anesthesia method:  Topical application ?  Topical anesthetic:  LET ?Laceration details:  ?  Location:  Scalp ?  Scalp location:  R parietal ?  Length (cm):  2.5 ?  Depth (mm):  2 ?Pre-procedure details:  ?  Preparation:  Patient was prepped and draped in usual sterile fashion ?Exploration:  ?  Limited defect created (wound extended): no   ?  Hemostasis achieved with:  LET ?  Imaging outcome: foreign body not noted   ?  Wound exploration: wound explored through full range of motion and entire depth of wound visualized   ?  Wound extent: areolar tissue violated   ?  Contaminated: no   ?Treatment:  ?  Area cleansed with:  Saline ?  Amount of cleaning:  Standard ?  Irrigation solution:  Sterile saline ?  Irrigation volume:  500 ?  Irrigation method:  Pressure wash ?  Visualized foreign bodies/material removed: yes   ?  Debridement:  Minimal ?Skin repair:  ?  Repair method:  Staples ?  Number of staples:  3 ?Approximation:  ?  Approximation:  Close ?Repair type:  ?  Repair type:  Simple ?Post-procedure details:  ?  Procedure completion:  Tolerated well, no immediate complications  ? ?Medications Ordered in ED ?Medications  ?lidocaine-EPINEPHrine-tetracaine (LET) topical gel (has no administration in time range)  ? ? ?ED Course/ Medical Decision Making/ A&P ?  ?                        ?Medical Decision Making ?Risk ?Prescription drug management. ? ?This patient presents to the ED for concern of laceration, this involves an extensive number of treatment options, and is a complaint that carries with it a high risk of complications and morbidity.  The differential diagnosis includes laceration, underlying fracture or intracranial injury ? ?Co morbidities that complicate the patient evaluation ?None ? ?Additional history obtained:  ?Additional history obtained from: Family at bedside ?External records from outside source obtained and reviewed including: None  ? ?EKG: ?Not indicated.   ? ?Cardiac Monitoring: ?Not indicated ? ?Lab Results: ?Not indicated  ? ?Imaging Studies ordered:  ?Not indicated ? ?Medications  ?I ordered medication including LET for anesthesia, percocet for pain ?Reevaluation of the patient after medication shows that patient stayed the same ?-I reviewed the patient's home medications and did not make adjustments. ?-I did  prescribe new home medications. ? ?Tests Considered: ?CT head - non focal neurological exam. No vomiting or visual  changes  ? ?Critical Interventions: ?Laceration closure ? ?Consultations: ?None indicated ? ?SDH ?None identified ? ?ED Course: ? ?26 year old female who presents to the emergency department for a laceration to her parietal scalp. Nonfocal neurological exam. No vomiting, visual changes, neck pain or stiffness. No crepitus on exam. Do not feel CT head warranted at this time. ?Wound was cleaned.  Let was placed with incomplete anesthesia.  Laceration closed with 3 staples.  Percocet for pain.  Given return instructions in 1 week for suture removal, sooner for signs of infection or complication. ? ?Given prescription for ibuprofen. ? ?After consideration of the diagnostic results and the patients response to treatment, I feel that the patent would benefit from discharge. ?The patient has been appropriately medically screened and/or stabilized in the ED. I have low suspicion for any other emergent medical condition which would require further screening, evaluation or treatment in the ED or require inpatient management. The patient is overall well appearing and non-toxic in appearance. They are hemodynamically stable at time of discharge.   ?Final Clinical Impression(s) / ED Diagnoses ?Final diagnoses:  ?Laceration of scalp, initial encounter  ? ? ?Rx / DC Orders ?ED Discharge Orders   ? ?      Ordered  ?  ibuprofen (ADVIL) 800 MG tablet  3 times daily       ? 10/24/21 1955  ? ?  ?  ? ?  ? ? ?  ?Cristopher PeruAutry, Deandrea Vanpelt E, PA-C ?10/24/21 1959 ? ?  ?Melene PlanFloyd, Dan,  DO ?10/24/21 1959 ? ?

## 2021-11-06 ENCOUNTER — Other Ambulatory Visit: Payer: Self-pay

## 2021-11-06 ENCOUNTER — Encounter (HOSPITAL_COMMUNITY): Payer: Self-pay

## 2021-11-06 ENCOUNTER — Emergency Department (HOSPITAL_COMMUNITY)
Admission: EM | Admit: 2021-11-06 | Discharge: 2021-11-06 | Disposition: A | Discharge status: Home / Self Care | Payer: Medicaid Other | Attending: Emergency Medicine | Admitting: Emergency Medicine

## 2021-11-06 DIAGNOSIS — W19XXXD Unspecified fall, subsequent encounter: Secondary | ICD-10-CM | POA: Insufficient documentation

## 2021-11-06 DIAGNOSIS — Z4802 Encounter for removal of sutures: Secondary | ICD-10-CM | POA: Insufficient documentation

## 2021-11-06 DIAGNOSIS — S0101XD Laceration without foreign body of scalp, subsequent encounter: Secondary | ICD-10-CM | POA: Insufficient documentation

## 2021-11-06 NOTE — ED Provider Notes (Signed)
?[facility] EMERGENCY DEPARTMENT ?Provider Note ? ? ?CSN: [id number] ?Arrival date & time: 11/06/21  1624 ? ?  ? ?History ? ?Chief Complaint  ?Patient presents with  ? Suture / Staple Removal  ? ? ?Kelsey Mcdaniel is a 26 y.o. female presenting to the ED for staple removal.  Had staples placed on her scalp on 10/24/2021 after a mechanical fall.  States that she has been doing overall well and denies any complaints. ? ? ?Suture / Staple Removal ?Pertinent negatives include no headaches.  ? ?  ? ?Home Medications ?Prior to Admission medications   ?Medication Sig Start Date End Date Taking? Authorizing Provider  ?diclofenac Sodium (VOLTAREN) 1 % GEL Apply 4 g topically 4 (four) times daily as needed. ?Patient not taking: Reported on 08/23/2021 07/04/21   Billey Co, MD  ?ibuprofen (ADVIL) 800 MG tablet Take 1 tablet (800 mg total) by mouth 3 (three) times daily. 10/24/21   Cristopher Peru, PA-C  ?norethindrone (MICRONOR) 0.35 MG tablet Take 1 tablet (0.35 mg total) by mouth daily. 07/04/21 10/02/21  Billey Co, MD  ?polyethylene glycol powder (GLYCOLAX/MIRALAX) 17 GM/SCOOP powder Take 17 g by mouth in the morning, at noon, and at bedtime. 09/15/21   Billey Co, MD  ?   ? ?Allergies    ?Patient has no known allergies.   ? ?Review of Systems   ?Review of Systems  ?Constitutional:  Negative for chills and fever.  ?Skin:  Positive for wound.  ?Neurological:  Negative for headaches.  ? ?Physical Exam ?Updated Vital Signs ?BP 108/68 (BP Location: Left Arm)   Pulse 79   Temp 98.9 ?F (37.2 ?C) (Oral)   Resp 16   Ht 5\' 2"  (1.575 m)   Wt 76 kg   SpO2 99%   BMI 30.65 kg/m?  ?Physical Exam ?Vitals and nursing note reviewed.  ?Constitutional:   ?   General: She is not in acute distress. ?   Appearance: She is well-developed. She is not diaphoretic.  ?HENT:  ?   Head: Normocephalic and atraumatic.  ?Eyes:  ?   General: No scleral icterus. ?   Conjunctiva/sclera: Conjunctivae normal.  ?Pulmonary:  ?    Effort: Pulmonary effort is normal. No respiratory distress.  ?Musculoskeletal:  ?   Cervical back: Normal range of motion.  ?Skin: ?   Findings: No rash.  ?   Comments: 3 staples in place on parietal scalp without overlying skin changes  ?Neurological:  ?   Mental Status: She is alert.  ? ? ?ED Results / Procedures / Treatments   ?Labs ?(all labs ordered are listed, but only abnormal results are displayed) ?Labs Reviewed - No data to display ? ?EKG ?None ? ?Radiology ?No results found. ? ?Procedures ? Suture Removal ? ?Date/Time: 11/06/2021 5:30 PM ?Performed by: 11/08/2021, PA-C ?Authorized by: Dietrich Pates, PA-C  ? ?Consent:  ?  Consent obtained:  Verbal ?  Consent given by:  Patient and spouse ?  Risks discussed:  Bleeding, pain and wound separation ?  Alternatives discussed:  No treatment ?Universal protocol:  ?  Patient identity confirmed:  Verbally with patient ?Location:  ?  Location:  Head/neck ?  Head/neck location:  Scalp ?Procedure details:  ?  Wound appearance:  No signs of infection, good wound healing and clean ?  Number of staples removed:  3 ?Post-procedure details:  ?  Post-removal:  No dressing applied ?  Procedure completion:  Tolerated well, no immediate complications  ? ? ?  Medications Ordered in ED ?Medications - No data to display ? ?ED Course/ Medical Decision Making/ A&P ?  ?                        ?Medical Decision Making ? ?Patient presents to ED for staple removal and wound check as above. Vitals normal.  Laceration appears to be healing well with no signs of infection or dehiscence.  Staples removed successfully here, patient tolerated procedure well. Scar minimization & wound care instructions given. ED return precautions given at discharge.  ? ? ?Patient is hemodynamically stable, in NAD, and able to ambulate in the ED. Evaluation does not show pathology that would require ongoing emergent intervention or inpatient treatment. I explained the diagnosis to the patient. Pain has been  managed and has no complaints prior to discharge. Patient is comfortable with above plan and is stable for discharge at this time. All questions were answered prior to disposition. Strict return precautions for returning to the ED were discussed. Encouraged follow up with PCP.  ? ?An After Visit Summary was printed and given to the patient. ? ? ?Portions of this note were generated with Scientist, clinical (histocompatibility and immunogenetics). Dictation errors may occur despite best attempts at proofreading. ? ? ? ? ? ? ? ? ?Final Clinical Impression(s) / ED Diagnoses ?Final diagnoses:  ?Encounter for staple removal  ? ? ?Rx / DC Orders ?ED Discharge Orders   ? ? None  ? ?  ? ? ?  Dietrich Pates, PA-C ?11/06/21 1731 ? ?  ?Wynetta Fines, MD ?11/07/21 2301 ? ?

## 2021-11-06 NOTE — ED Triage Notes (Signed)
Pt arrived POV from home c/o suture removal. Pt was seen here 10 days ago and given sutures to the top of her head.  ?

## 2021-11-06 NOTE — Discharge Instructions (Signed)
Return to the ER for signs of infection including redness, drainage, swelling or fevers. ?

## 2021-11-21 ENCOUNTER — Ambulatory Visit: Payer: Medicaid Other | Admitting: Student

## 2021-11-21 ENCOUNTER — Encounter: Payer: Self-pay | Admitting: Student

## 2021-11-21 VITALS — BP 102/70 | HR 64 | Ht 62.0 in | Wt 168.0 lb

## 2021-11-21 DIAGNOSIS — M25541 Pain in joints of right hand: Secondary | ICD-10-CM

## 2021-11-21 DIAGNOSIS — M2550 Pain in unspecified joint: Secondary | ICD-10-CM

## 2021-11-21 DIAGNOSIS — M25561 Pain in right knee: Secondary | ICD-10-CM

## 2021-11-21 MED ORDER — NAPROXEN 500 MG PO TABS: 500.0000 mg | ORAL_TABLET | Freq: Two times a day (BID) | ORAL | 0 refills | Status: DC

## 2021-11-21 NOTE — Patient Instructions (Signed)
It was great to see you! Thank you for allowing me to participate in your care!  I recommend that you always bring your medications to each appointment as this makes it easy to ensure you are on the correct medications and helps [place] not miss when refills are needed.  Our plans for today:  - Please make a follow up appointment with Dr. Miquel Dunn in 1-2 weeks.   We are checking some labs today, I will call you if they are abnormal will send you a MyChart message or a letter if they are normal.  If you do not hear about your labs in the next 2 weeks please let us know.  Take care and seek immediate care sooner if you develop any concerns.   Dr. Erick Alley, DO [facility]

## 2021-11-21 NOTE — Progress Notes (Unsigned)
    SUBJECTIVE:   CHIEF COMPLAINT / HPI:   78 Interpreter used for entirety of visit   Bruising and swelling For past several months, has had swelling and pain around joints body, worse in fingers and knees, tightness/stiffness in muscles. Swelling and pain sometimes makes it difficult to walk. The swelling occurs every couple days. Fingers will bruise for a couple days after she grabs something like a pot or a knife to cook and will ache in the finger joints, worse with movement. She also has pain in her lower neck and shoulders.   She was previously prescribed naproxen for shin splints which also helped with joint pain but hasn't taken it for past two months. Has not tried any other medications.   Her mother has similar symptoms and has rheumatoid arthritis.   PERTINENT  PMH / PSH: none pertinent   OBJECTIVE:   BP 102/70   Pulse 64   Ht _0  (1.575 m)   Wt 168 lb (76.2 kg)   SpO2 98%   BMI 30.73 kg/m    General: NAD, pleasant, able to participate in exam Cardiac: RRR, no murmurs. Respiratory: CTAB, normal effort, No wheezes, rales or rhonchi Extremities: Very mild edema around fingers of bilateral hands and bilateral knees, but this is noted mostly per patient, states this is not her baseline.  There is no erythema or warmth around joints of the fingers or knees. MSK: Good bilateral grip strength, 5/5 muscle strength of BLEs with pain noted per patient with bilateral grip strength and with flexion/extension of bilateral knees Skin: warm and dry, no rashes noted on hands or BLEs Neuro: alert, no obvious focal deficits Psych: Normal affect and mood  ASSESSMENT/PLAN:   Arthralgia Symptoms are consistent with rheumatoid arthritis and as patient's mother has a diagnosis of rheumatoid arthritis, will first do laboratory testing to confirm this diagnosis including rheumatoid factor, anti-CCP antibodies, CRP, and ESR.  Can also consider other diagnoses such as.  I am not worried  about infection as there is no warmth or erythema around joints and patient is afebrile. -f/u labs -Naproxen 500 mg twice daily -f/u with PCP, Dr. Thompson Grayer in 1-2 weeks     Dr. Precious Gilding, [place]

## 2021-11-22 DIAGNOSIS — M25561 Pain in right knee: Secondary | ICD-10-CM | POA: Insufficient documentation

## 2021-11-22 DIAGNOSIS — M2550 Pain in unspecified joint: Secondary | ICD-10-CM | POA: Insufficient documentation

## 2021-11-22 LAB — RHEUMATOID FACTOR: Rheumatoid fact SerPl-aCnc: <10 IU/mL (ref <14.0)

## 2021-11-22 LAB — C-REACTIVE PROTEIN: CRP: 2 mg/L (ref 0–10)

## 2021-11-22 LAB — SEDIMENTATION RATE: Sed Rate: 45 mm/hr — ABNORMAL HIGH (ref 0–32)

## 2021-11-22 NOTE — Assessment & Plan Note (Addendum)
Symptoms are consistent with rheumatoid arthritis and as patient's mother has a diagnosis of rheumatoid arthritis, will first do laboratory testing to confirm this diagnosis including rheumatoid factor, anti-CCP antibodies, CRP, and ESR.  Can also consider other diagnoses such as.  I am not worried about infection as there is no warmth or erythema around joints and patient is afebrile. -f/u labs -Naproxen 500 mg twice daily -f/u with PCP, Dr. Thompson Grayer in 1-2 weeks

## 2021-11-24 LAB — ANTI-CCP AB, IGG + IGA (RDL): Anti-CCP Ab, IgG + IgA (RDL): <20 Units (ref <20)

## 2021-12-11 NOTE — Progress Notes (Unsigned)
SUBJECTIVE:   CHIEF COMPLAINT / HPI:   Joint pain- previous lab work with only mildly elevated ESR at 45. Tried naproxen, helped ***  PERTINENT  PMH / PSH: ***  OBJECTIVE:   There were no vitals taken for this visit.  ***  ASSESSMENT/PLAN:   No problem-specific Assessment & Plan notes found for this encounter.      E , MD [facility]  

## 2021-12-15 ENCOUNTER — Ambulatory Visit: Payer: Medicaid Other | Admitting: Family Medicine

## 2021-12-15 ENCOUNTER — Encounter: Payer: Self-pay | Admitting: Family Medicine

## 2021-12-15 VITALS — BP 110/67 | HR 70 | Ht 62.0 in | Wt 168.0 lb

## 2021-12-15 DIAGNOSIS — N823 Fistula of vagina to large intestine: Secondary | ICD-10-CM

## 2021-12-15 DIAGNOSIS — Z3009 Encounter for other general counseling and advice on contraception: Secondary | ICD-10-CM

## 2021-12-15 DIAGNOSIS — M2550 Pain in unspecified joint: Secondary | ICD-10-CM | POA: Diagnosis not present

## 2021-12-15 DIAGNOSIS — R12 Heartburn: Secondary | ICD-10-CM

## 2021-12-15 MED ORDER — FAMOTIDINE 20 MG PO TABS: 20.0000 mg | ORAL_TABLET | Freq: Every day | ORAL | 0 refills | Status: DC

## 2021-12-15 MED ORDER — NORETHINDRONE 0.35 MG PO TABS: 1.0000 | ORAL_TABLET | Freq: Every day | ORAL | 3 refills | Status: DC

## 2021-12-15 MED ORDER — NAPROXEN 500 MG PO TABS: 500.0000 mg | ORAL_TABLET | Freq: Two times a day (BID) | ORAL | 1 refills | Status: AC

## 2021-12-15 MED ORDER — POLYETHYLENE GLYCOL 3350 17 GM/SCOOP PO POWD: 17.0000 g | Freq: Three times a day (TID) | ORAL | 11 refills | Status: DC

## 2022-01-19 ENCOUNTER — Ambulatory Visit: Payer: Medicaid Other | Admitting: Family Medicine

## 2022-03-29 IMAGING — CR DG CHEST 1V
1 series · 1 of 1 positions shown · non-contrast
Comparison: None.

CLINICAL DATA: Positive PPD

EXAM:
CHEST  1 VIEW

[w chest pa]
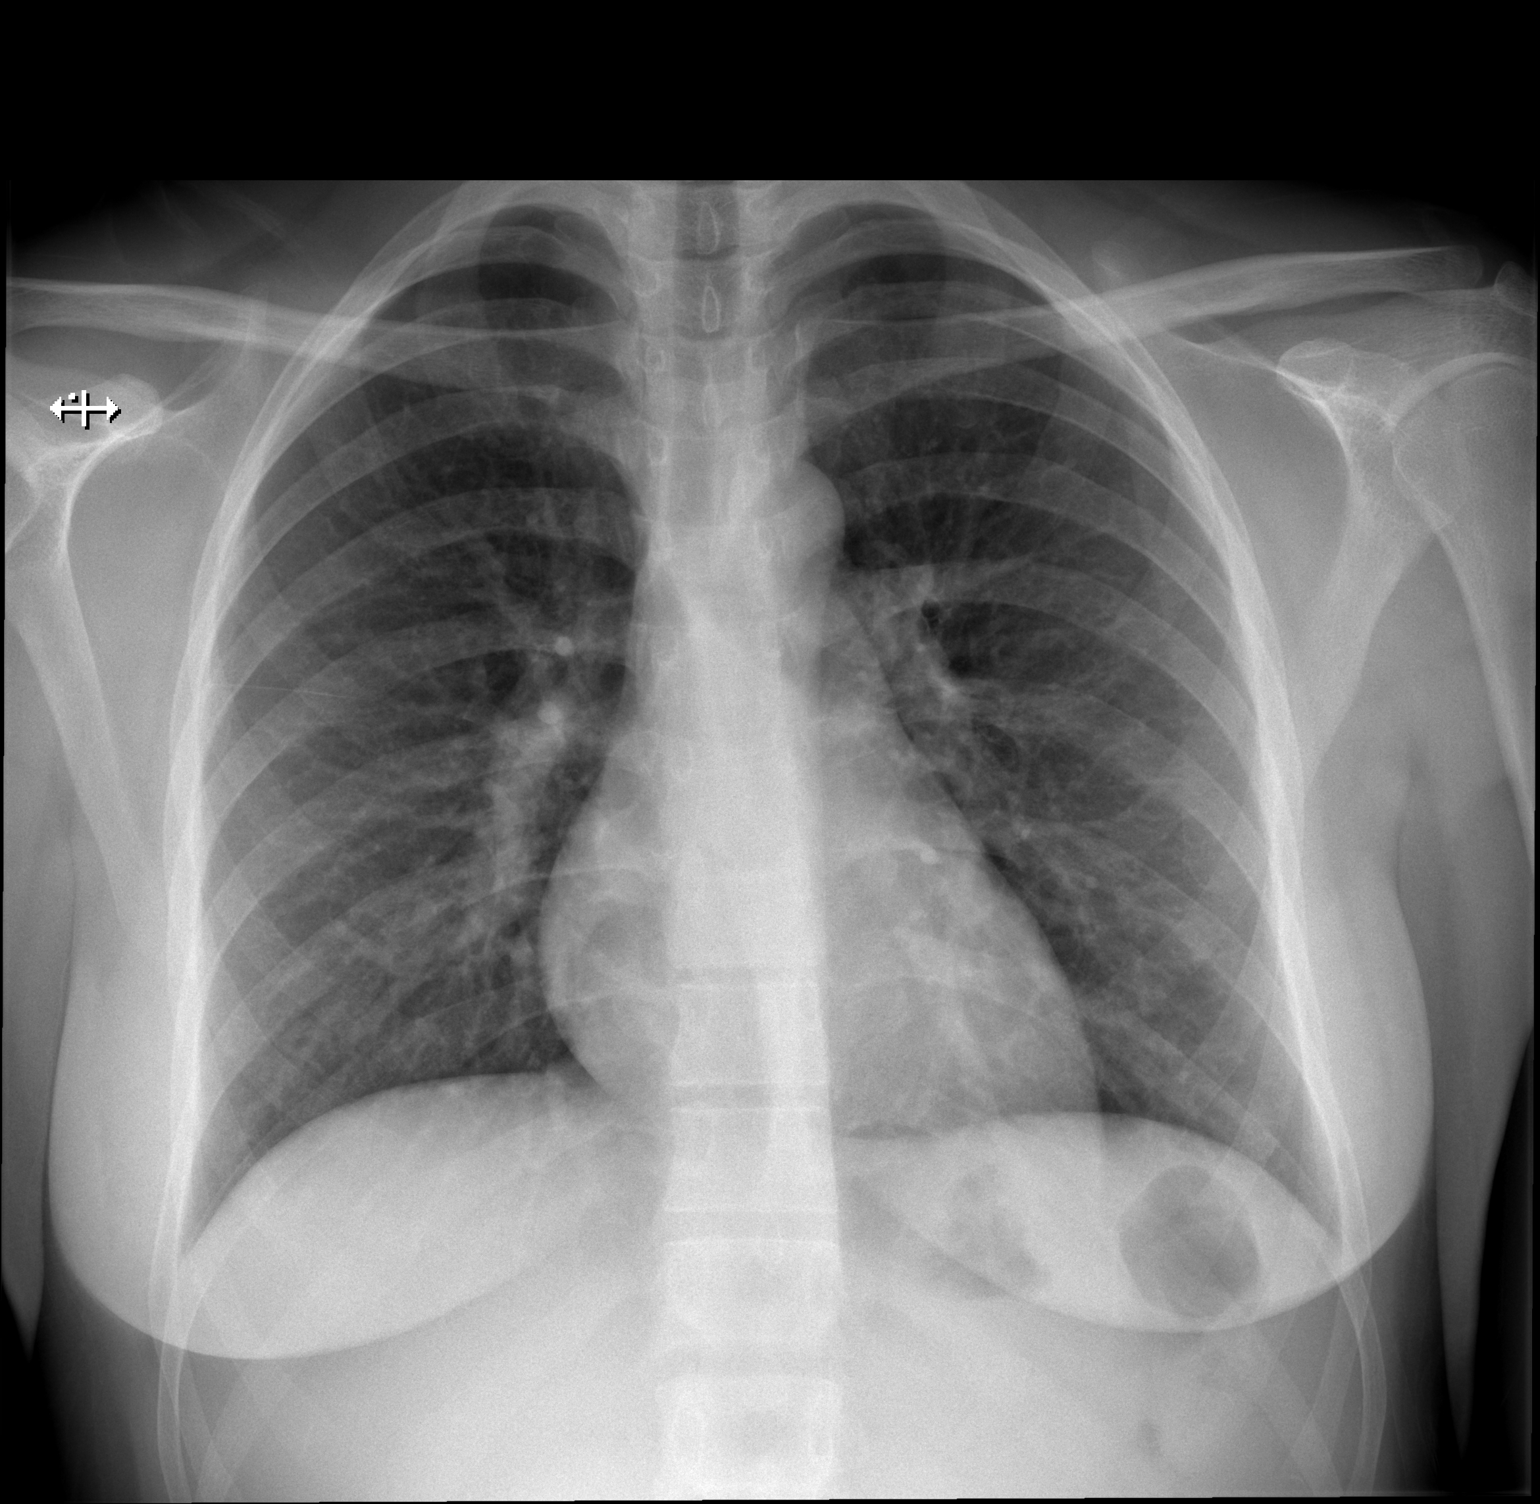

[1 of 1 positions shown; findings below may reference images not displayed]

FINDINGS: The heart size and mediastinal contours are within normal limits.
Both lungs are clear. The visualized skeletal structures are
unremarkable.
IMPRESSION: No active disease.

## 2022-03-30 ENCOUNTER — Emergency Department (HOSPITAL_COMMUNITY)
Admission: EM | Admit: 2022-03-30 | Discharge: 2022-03-31 | Discharge status: Against Medical Advice | Payer: Medicaid Other

## 2022-03-31 NOTE — ED Notes (Signed)
Called pt for triage, no response 

## 2022-03-31 NOTE — ED Notes (Signed)
Called pt for vitals multiple times, no response 

## 2022-04-04 NOTE — Progress Notes (Signed)
    SUBJECTIVE:   CHIEF COMPLAINT / HPI:   Joint Pain:  She has been seen previously with Dr. Thompson Grayer and reports similar findings to previous. Naprosyn for pain daily, it was helpful initially but no longer is. There is pain in the hands including the MCP and DCP joints, knees reported today.  She also reports swelling.  It has been present for several months.  On last visit, she had been referred to a rheumatologist but has yet to follow-up with them.  As previously noted, lab work has been unremarkable other than mildly elevated ESR to 45  PERTINENT  PMH / PSH: Tension headaches, heartburn, arthralgia  OBJECTIVE:   BP 108/84   Pulse 96   Ht $R'5\' 2"'vU$  (1.575 m)   Wt 172 lb 6.4 oz (78.2 kg)   SpO2 100%   BMI 31.53 kg/m   General: Alert and oriented in no apparent distress: Pleasant female Heart: Regular rate and rhythm with no murmurs appreciated Lungs: CTA bilaterally, no wheezing Abdomen: Bowel sounds present, no abdominal pain Skin: Warm and dry Extremities: No obvious swelling or deformities of the upper or lower extremities.  Full flexion and extension of the wrists bilaterally.  Tenderness to palpation over the DIP and PIP joints.  Palpable radial pulse bilaterally.  Good flexion and extension of the knees with diffuse tenderness.   ASSESSMENT/PLAN:   Arthralgia It seems that the patient is suffering from most likely cause inflammatory versus somatic arthralgias.  She does not appear infectious and does not have systemic symptoms, which is reassuring.  She is hemodynamically stable here in office as well as well-appearing and nontoxic.  We discussed that she needs to see the rheumatologist for further evaluation given that her lab work is unremarkable and would be more in their scope for further treatment.  They have had difficulty with scheduling but we will try to work around this.  Continue with pain control via Naprosyn. - Continue with Naprosyn twice daily - Monitor for  symptoms of GERD or ulcers - Keep an eye on kidney function - Lidocaine gel ordered  Amenorrhea Patient reports that she stopped taking her contraception 3 months ago and does not wish to continue taking it.  She would like a pregnancy test.  Pregnancy test ordered today and negative.  Should continue with preconception counseling moving forward     Erskine Emery, MD [place]

## 2022-04-05 ENCOUNTER — Ambulatory Visit: Payer: Medicaid Other | Admitting: Student

## 2022-04-05 VITALS — BP 108/84 | HR 96 | Ht 62.0 in | Wt 172.4 lb

## 2022-04-05 DIAGNOSIS — N912 Amenorrhea, unspecified: Secondary | ICD-10-CM | POA: Diagnosis present

## 2022-04-05 DIAGNOSIS — M2550 Pain in unspecified joint: Secondary | ICD-10-CM | POA: Diagnosis not present

## 2022-04-05 LAB — POCT URINE PREGNANCY: Preg Test, Ur: NEGATIVE

## 2022-04-05 MED ORDER — LIDOCAINE 5 % EX OINT: 1.0000 | TOPICAL_OINTMENT | CUTANEOUS | 0 refills | Status: DC | PRN

## 2022-04-05 NOTE — Patient Instructions (Addendum)
It was great to see you today! Thank you for choosing [facility] for your primary care. Kelsey Mcdaniel was seen for joint pain   I will try to reach rheumatology for an appt  I have ordered Lidocaine gel for your joints INCREASE the Naprosyn to TWICE daily   If you haven't already, sign up for My Chart to have easy access to your labs results, and communication with your primary care physician.   You should return to our clinic Return in about 4 weeks (around 05/03/2022), or WITH PCP. Please arrive 15 minutes before your appointment to ensure smooth check in process.  We appreciate your efforts in making this happen.  Thank you for allowing me to participate in your care, Erskine Emery, MD 04/05/2022, 3:56 PM PGY-2, [place]

## 2022-04-06 ENCOUNTER — Encounter: Payer: Self-pay | Admitting: Student

## 2022-04-06 DIAGNOSIS — N912 Amenorrhea, unspecified: Secondary | ICD-10-CM | POA: Insufficient documentation

## 2022-04-06 NOTE — Progress Notes (Signed)
    SUBJECTIVE:   CHIEF COMPLAINT / HPI:   Joint Pain:  She has been seen previously with Dr. Thompson Grayer and reports similar findings to previous. Naprosyn for pain daily, it was helpful initially but no longer is. There is pain in the hands including the MCP and DCP joints, knees reported today.  She also reports swelling.  It has been present for several months.  On last visit, she had been referred to a rheumatologist but has yet to follow-up with them.  As previously noted, lab work has been unremarkable other than mildly elevated ESR to 45  PERTINENT  PMH / PSH: Tension headaches, heartburn, arthralgia  OBJECTIVE:   BP 108/84   Pulse 96   Ht $R'5\' 2"'Tk$  (1.575 m)   Wt 172 lb 6.4 oz (78.2 kg)   SpO2 100%   BMI 31.53 kg/m   General: Alert and oriented in no apparent distress: Pleasant female Heart: Regular rate and rhythm with no murmurs appreciated Lungs: CTA bilaterally, no wheezing Abdomen: Bowel sounds present, no abdominal pain Skin: Warm and dry Extremities: No obvious swelling or deformities of the upper or lower extremities.  Full flexion and extension of the wrists bilaterally.  Tenderness to palpation over the DIP and PIP joints.  Palpable radial pulse bilaterally.  Good flexion and extension of the knees with diffuse tenderness.   ASSESSMENT/PLAN:   Arthralgia It seems that the patient is suffering from most likely cause inflammatory versus somatic arthralgias.  She does not appear infectious and does not have systemic symptoms, which is reassuring.  She is hemodynamically stable here in office as well as well-appearing and nontoxic.  We discussed that she needs to see the rheumatologist for further evaluation given that her lab work is unremarkable and would be more in their scope for further treatment.  They have had difficulty with scheduling but we will try to work around this.  Continue with pain control via Naprosyn. - Continue with Naprosyn twice daily - Monitor for  symptoms of GERD or ulcers - Keep an eye on kidney function - Lidocaine gel ordered  Amenorrhea Patient reports that she stopped taking her contraception 3 months ago and does not wish to continue taking it.  She would like a pregnancy test.  Pregnancy test ordered today and negative.  Should continue with preconception counseling moving forward     Erskine Emery, MD [place]

## 2022-04-06 NOTE — Assessment & Plan Note (Signed)
It seems that the patient is suffering from most likely cause inflammatory versus somatic arthralgias.  She does not appear infectious and does not have systemic symptoms, which is reassuring.  She is hemodynamically stable here in office as well as well-appearing and nontoxic.  We discussed that she needs to see the rheumatologist for further evaluation given that her lab work is unremarkable and would be more in their scope for further treatment.  They have had difficulty with scheduling but we will try to work around this.  Continue with pain control via Naprosyn. - Continue with Naprosyn twice daily - Monitor for symptoms of GERD or ulcers - Keep an eye on kidney function - Lidocaine gel ordered

## 2022-04-06 NOTE — Assessment & Plan Note (Signed)
Patient reports that she stopped taking her contraception 3 months ago and does not wish to continue taking it.  She would like a pregnancy test.  Pregnancy test ordered today and negative.  Should continue with preconception counseling moving forward

## 2022-04-29 IMAGING — US US MFM OB FOLLOW-UP
1 series · 13 of 28 positions shown · non-contrast
Comparison: none

[Series 1: us mfm ob follow-up · 13 of 57 slices shown]
[im 3/57]
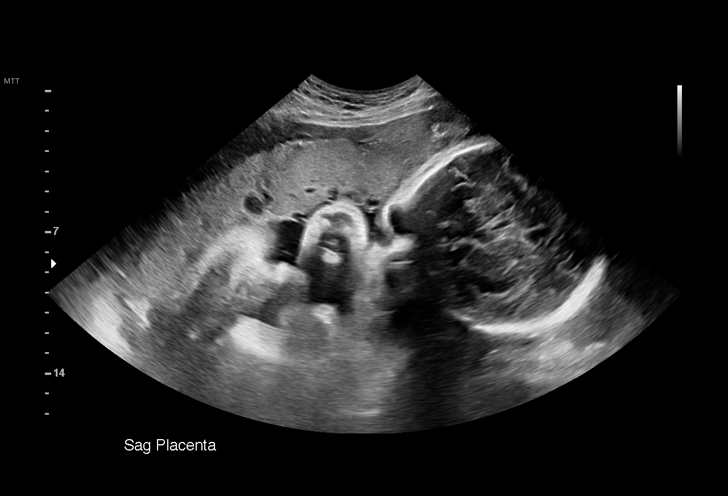
[im 7/57]
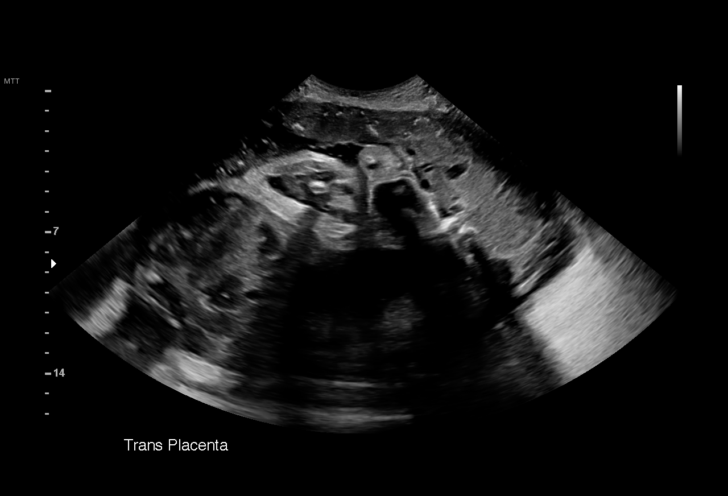
[im 11/57]
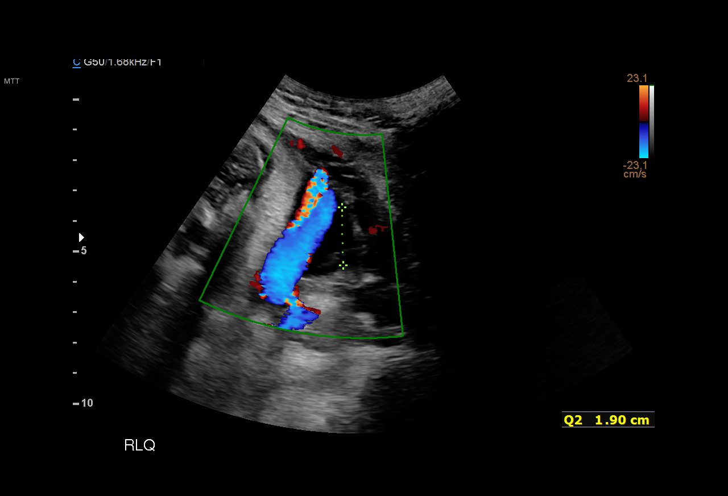
[im 15/57]
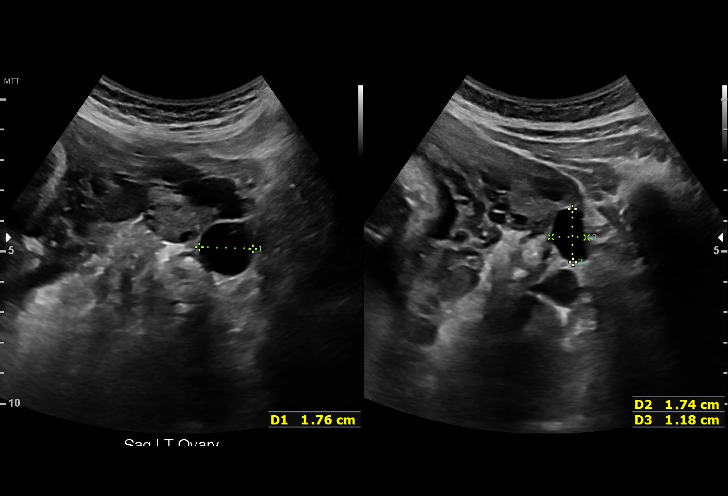
[im 19/57]
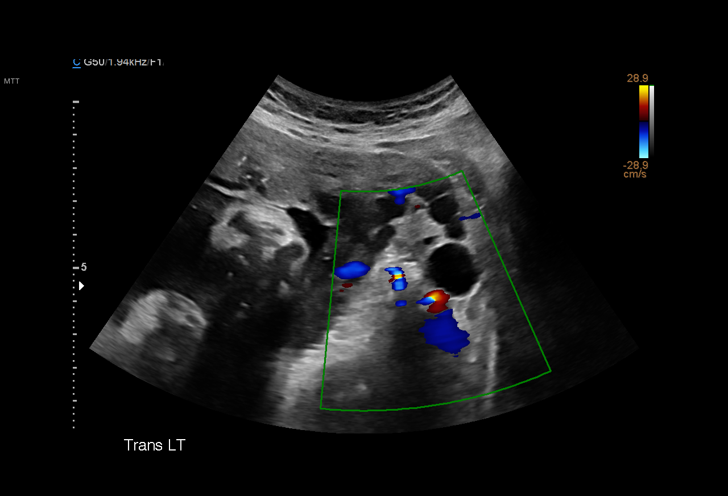
[im 23/57]
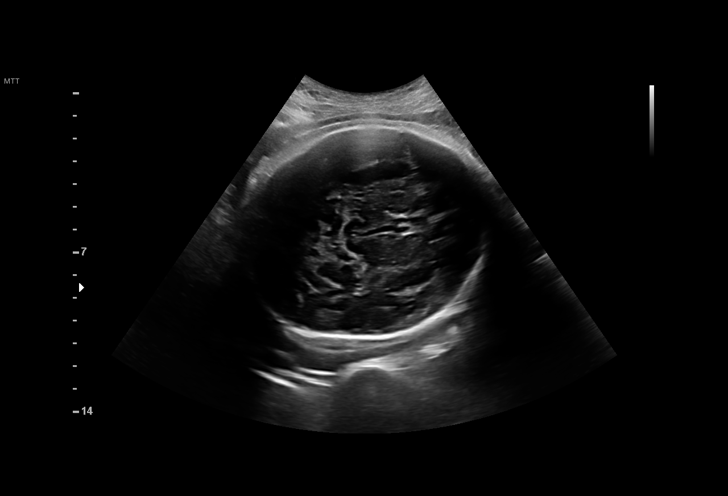
[im 30/57]
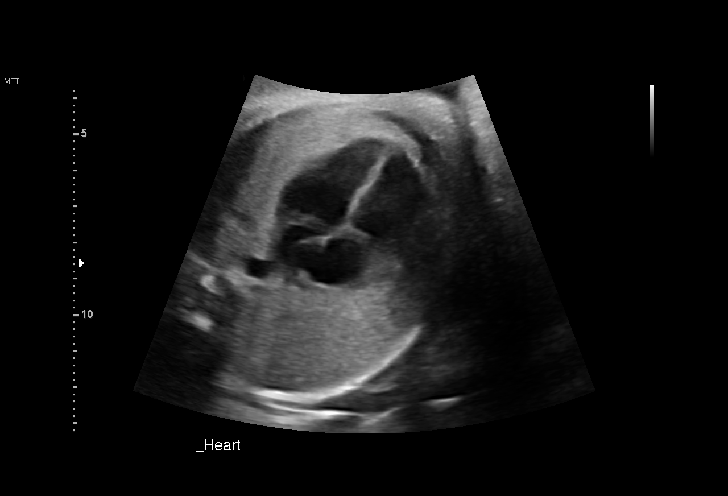
[im 34/57]
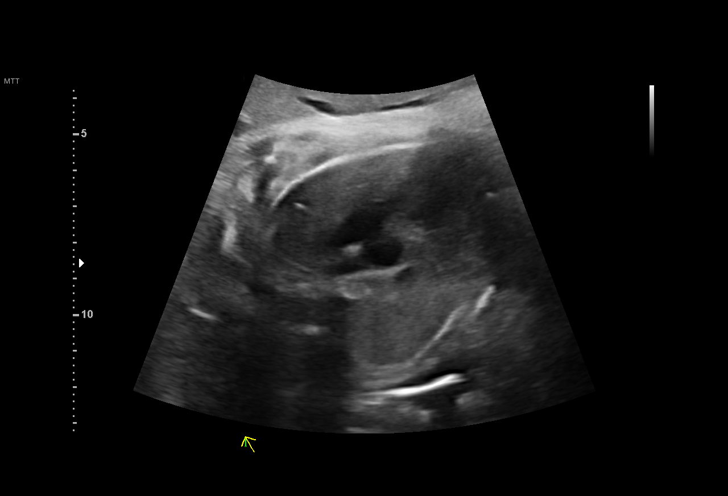
[im 38/57]
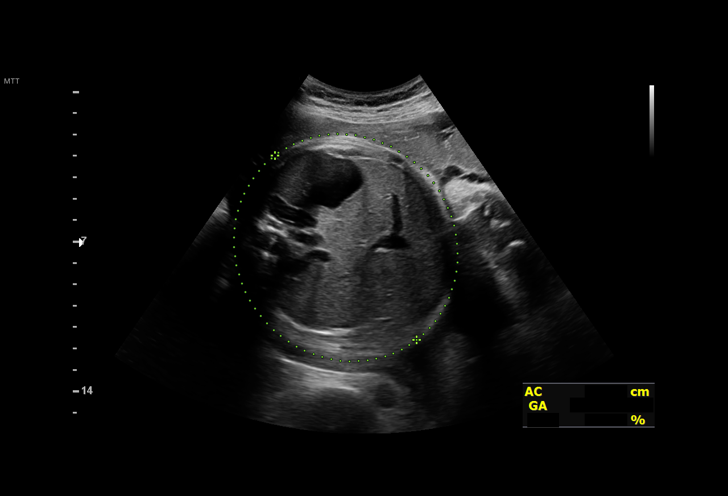
[im 42/57]
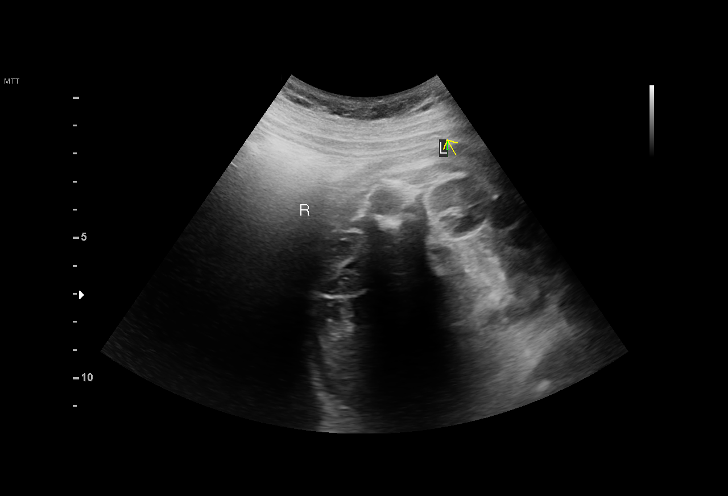
[im 46/57]
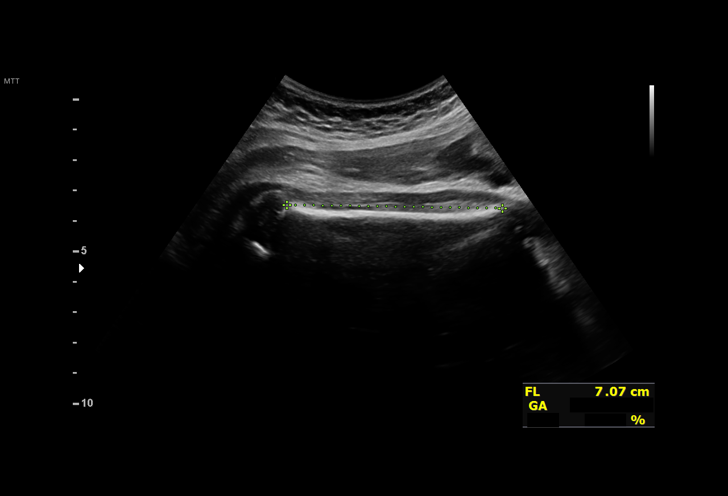
[im 50/57]
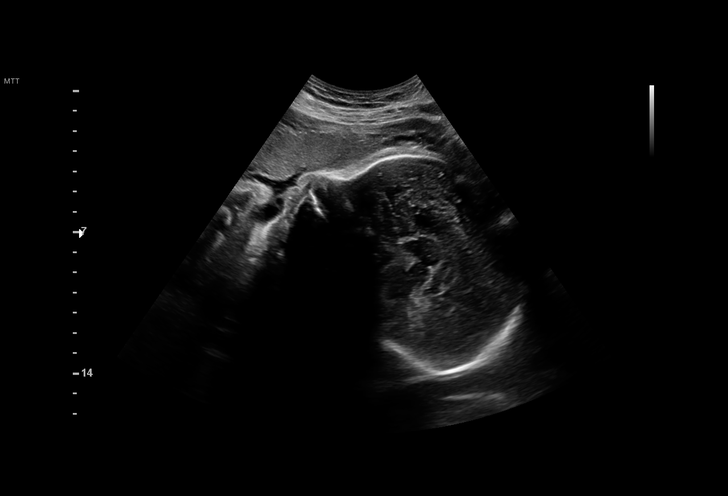
[im 54/57]
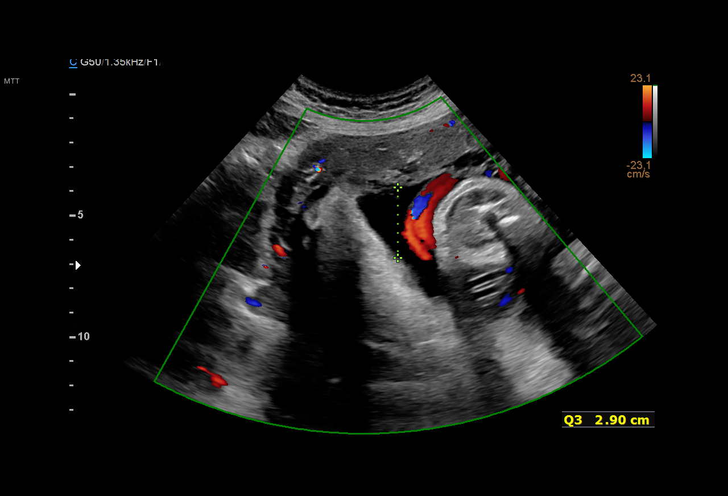

[13 of 28 positions shown; findings below may reference images not displayed]

[REDACTED]care

Indications

 Medical complication of pregnancy (HX
 rectovaginal fistula repair after prior vaginal
 delivery)
 36 weeks gestation of pregnancy
 Encounter for other antenatal screening
 follow-up
 Poor obstetric history: Previous
 preeclampsia / eclampsia/gestational HTN
 Anemia during pregnancy in third trimester
Fetal Evaluation

 Num Of Fetuses:         1
 Fetal Heart Rate(bpm):  152
 Cardiac Activity:       Observed
 Presentation:           Cephalic
 Placenta:               Anterior Fundal
 P. Cord Insertion:      Previously Visualized

 Amniotic Fluid
 AFI FV:      Subjectively low-normal

 AFI Sum(cm)     %Tile       Largest Pocket(cm)
 8.1             9

 RUQ(cm)       RLQ(cm)       LUQ(cm)        LLQ(cm)
 0
Biometry
 BPD:      93.1  mm     G. Age:  37w 6d         91  %    CI:        79.99   %    70 - 86
                                                         FL/HC:      21.6   %    20.1 -
 HC:      328.9  mm     G. Age:  37w 3d         44  %    HC/AC:      1.01        0.93 -
 AC:      325.7  mm     G. Age:  36w 3d         64  %    FL/BPD:     76.3   %    71 - 87
 FL:         71  mm     G. Age:  36w 3d         46  %    FL/AC:      21.8   %    20 - 24
 LV:        6.3  mm

 Est. FW:    7877  gm    6 lb 10 oz      61  %
OB History

 Gravidity:    2         Term:   1
 Living:       1
Gestational Age

 Clinical EDD:  36w 3d                                        EDD:   10/14/20
 U/S Today:     37w 0d                                        EDD:   10/10/20
 Best:          36w 3d     Det. By:  Clinical EDD             EDD:   10/14/20
Anatomy

 Cranium:               Appears normal         LVOT:                   Previously seen
 Cavum:                 Appears normal         Aortic Arch:            Previously seen
 Ventricles:            Appears normal         Ductal Arch:            Previously seen
 Choroid Plexus:        Previously seen        Diaphragm:              Appears normal
 Cerebellum:            Appears normal         Stomach:                Appears normal, left
                                                                       sided
 Posterior Fossa:       Previously seen        Abdomen:                Appears normal
 Nuchal Fold:           Previously seen        Abdominal Wall:         Previously seen
 Face:                  Orbits and profile     Cord Vessels:           Appears normal (3
                        previously seen                                vessel cord)
 Lips:                  Previously seen        Kidneys:                Appear normal
 Palate:                Previously seen        Bladder:                Appears normal
 Thoracic:              Appears normal         Spine:                  Previously seen
                        Appears normal
 Heart:                 Appears normal         Upper Extremities:      Previously seen
                        (4CH, axis, and
                        situs)
 RVOT:                  Appears normal         Lower Extremities:      Previously seen

 Other:  Fetus appears to be female. Technically difficult due to advanced GA
         and fetal position.
Cervix Uterus Adnexa

 Cervix
 Not visualized (advanced GA >88wks)

 Uterus
 No abnormality visualized.

 Right Ovary
 Within normal limits.
 Left Ovary
 Within normal limits.

 Cul De Sac
 No free fluid seen.

 Adnexa
 No abnormality visualized.
Comments

 This patient was seen for a follow up growth scan to estimate
 the fetal weight as she had a rectovaginal fistula following the
 delivery of her last baby.  She reports that her last child was
 delivered at term weighing 6-1/2 kg(about 14 pounds).  She
 had fourth degree perineal laceration and subsequently
 developed the rectovaginal fistula.  She did not have diabetes
 in that pregnancy and has screened negative for diabetes in
 her current pregnancy.
 She was informed that the fetal growth and amniotic fluid
 level appears appropriate for her gestational age.
 The patient reports that she has been cleared for a vaginal
 delivery in her current pregnancy.  Based on the EFW
 obtained today, this baby will most likely weigh between 7-1/2
 to 8 pounds should she deliver by her due date.
 Follow-up as indicated.

## 2022-05-29 ENCOUNTER — Ambulatory Visit: Payer: Medicaid Other | Admitting: Family Medicine

## 2022-05-29 NOTE — Progress Notes (Deleted)
    SUBJECTIVE:   CHIEF COMPLAINT / HPI:   Preconception counseling -   Joint pain-   PERTINENT  PMH / PSH: ***  OBJECTIVE:   There were no vitals taken for this visit.  ***  ASSESSMENT/PLAN:   No problem-specific Assessment & Plan notes found for this encounter.     Billey Co, MD [facility]

## 2022-06-04 IMAGING — US US PELVIS COMPLETE
1 series · 15 of 25 positions shown · non-contrast
Comparison: None

CLINICAL DATA: Heavy vaginal bleeding postpartum, spontaneous
vaginal delivery on 10/12/2019; grade 4 perineal laceration

EXAM:
TRANSABDOMINAL ULTRASOUND OF PELVIS
TECHNIQUE: Transabdominal ultrasound examination of the pelvis was performed
including evaluation of the uterus, ovaries, adnexal regions, and
pelvic cul-de-sac.

[Series 1: us pelvis complete · 15 of 66 slices shown]
[im 1/66]
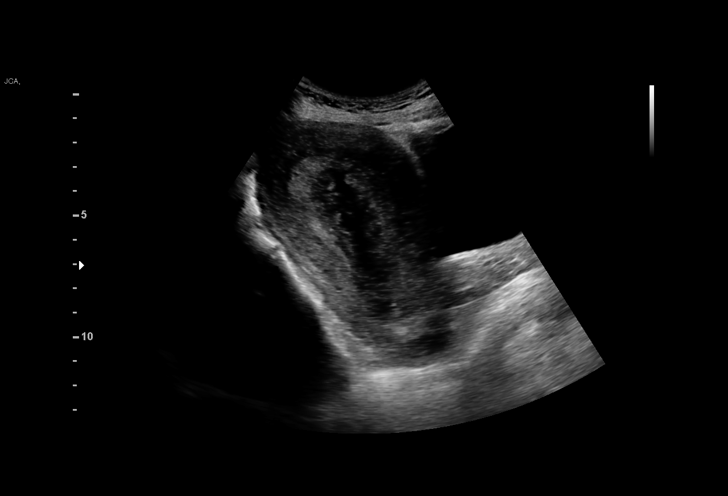
[im 6/66]
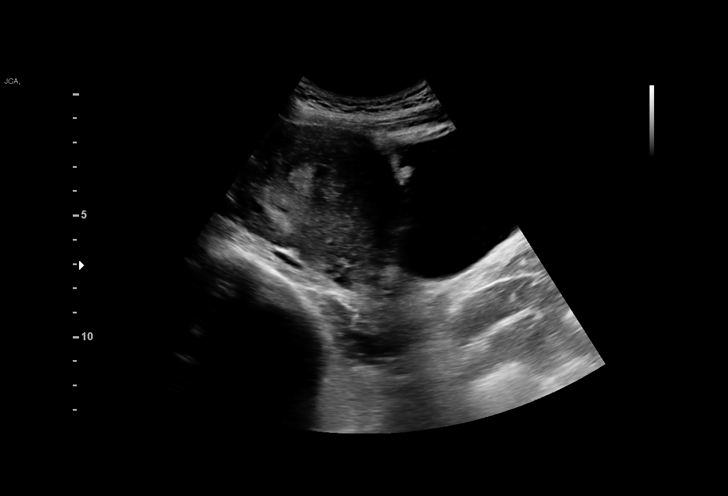
[im 11/66]
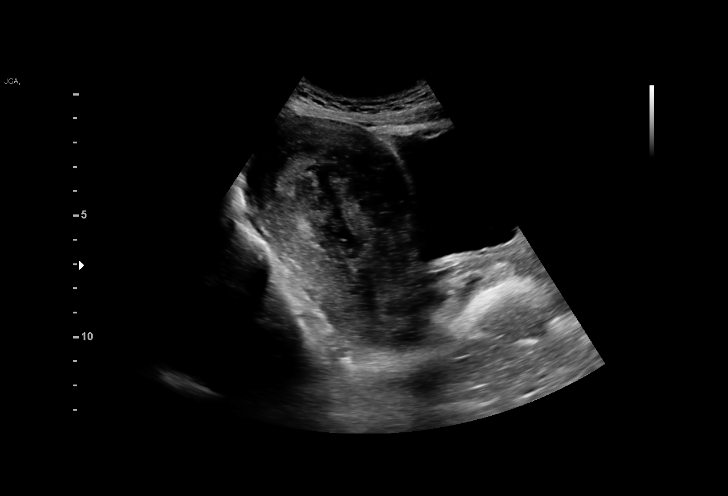
[im 14/66]
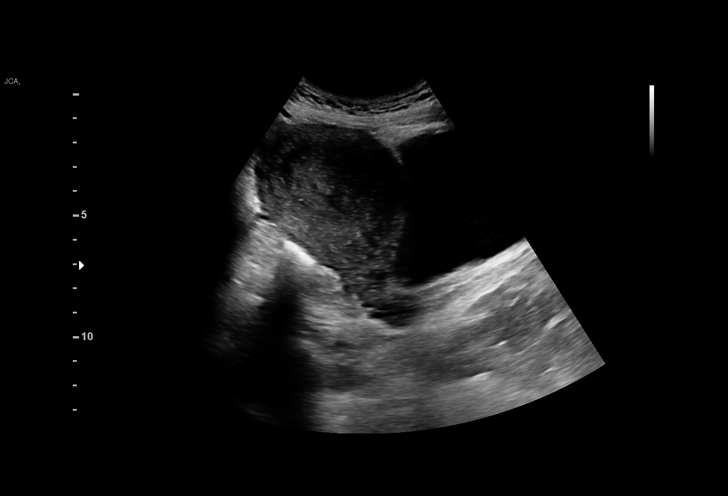
[im 19/66]
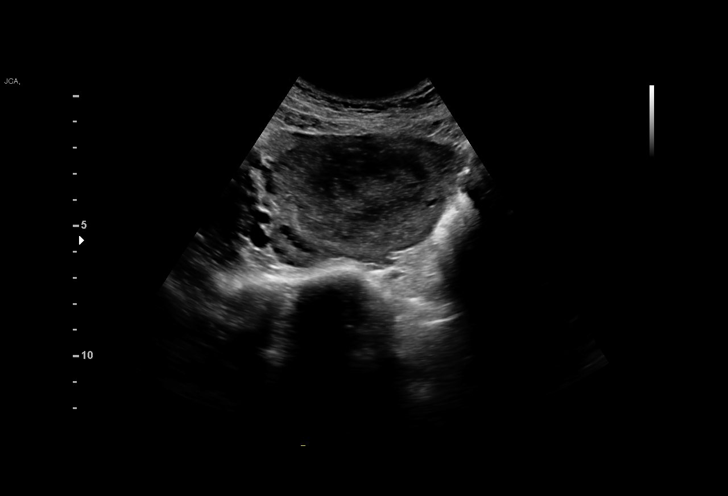
[im 25/66]
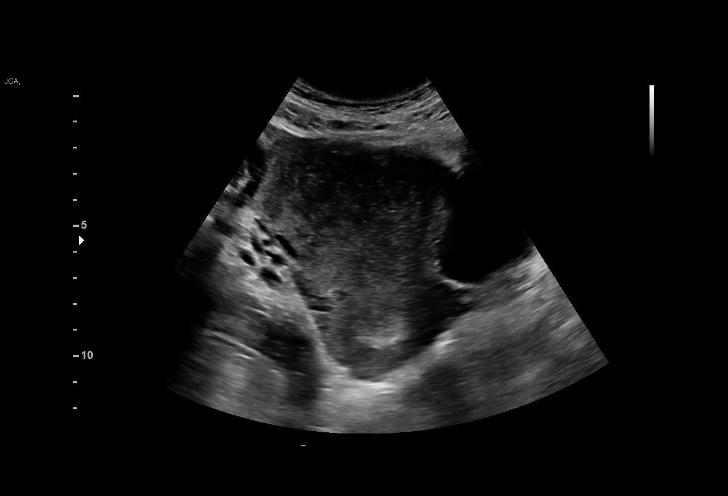
[im 28/66]
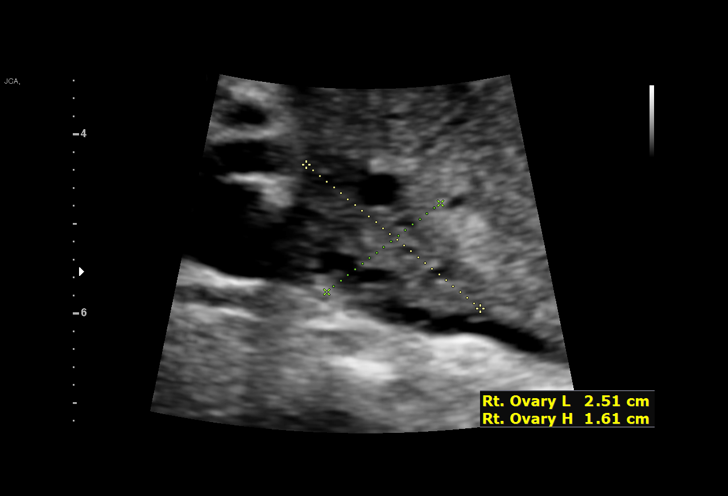
[im 33/66]
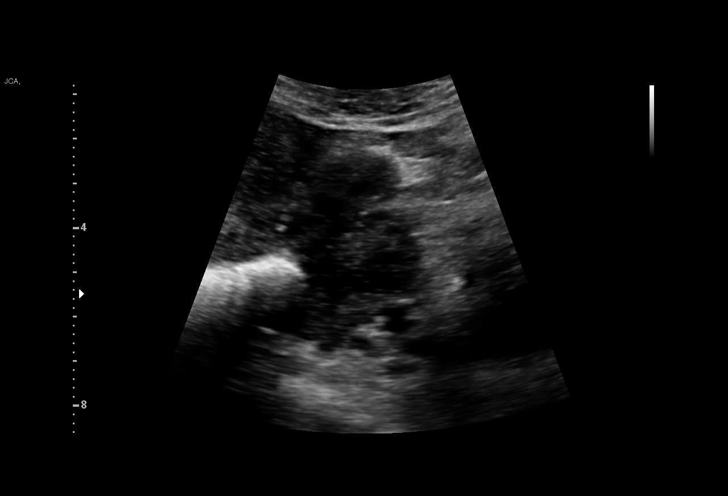
[im 38/66]
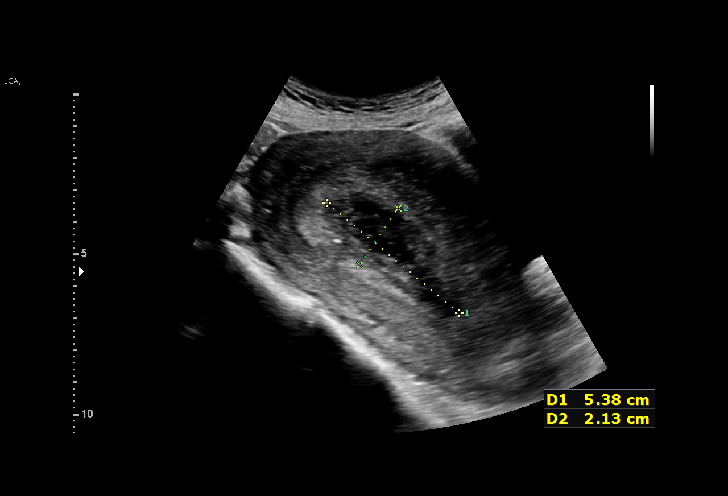
[im 41/66]
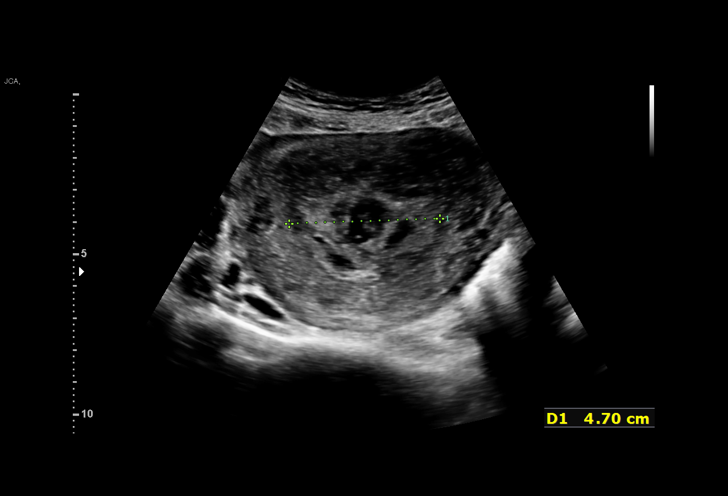
[im 47/66]
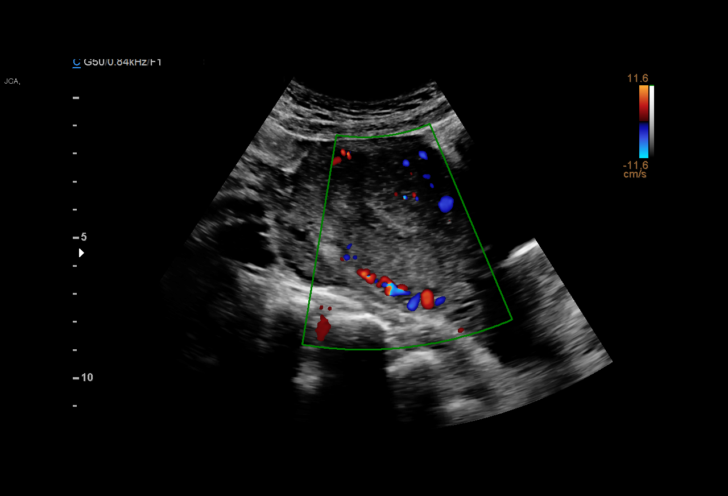
[im 52/66]
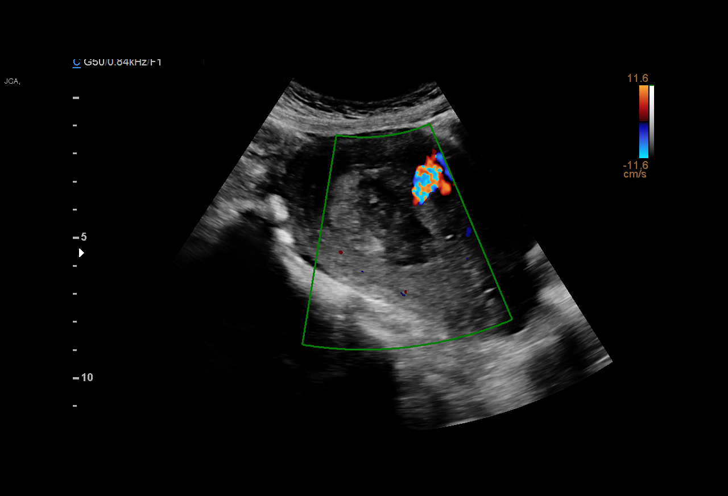
[im 55/66]
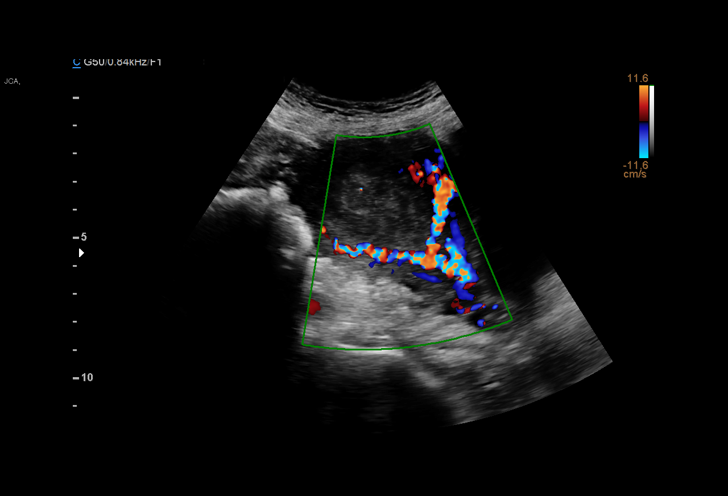
[im 60/66]
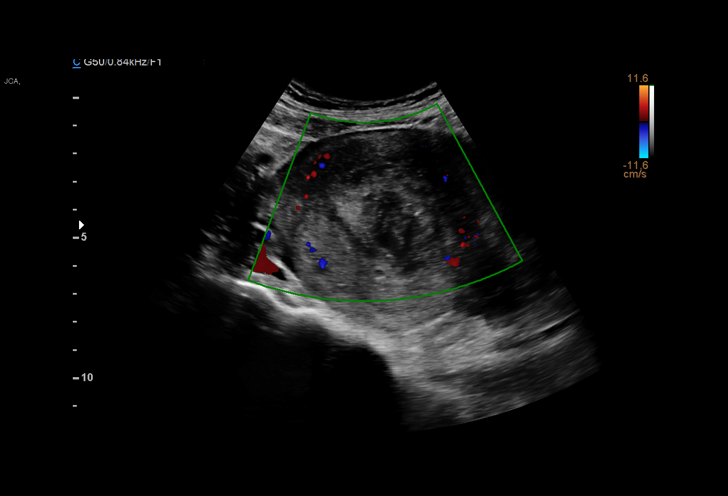
[im 66/66]
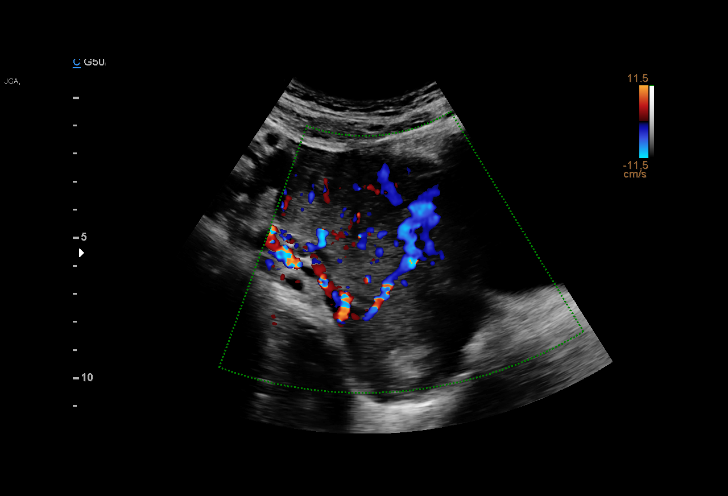

[15 of 25 positions shown; findings below may reference images not displayed]

FINDINGS: Uterus

Measurements: 9.8 x 5.9 x 8.1 cm = volume: 247 mL. Anteverted.
Normal morphology without mass

Endometrium

Endometrial layers poorly defined. Large complex heterogeneous
collections seen distending the endometrial canal measuring up to 28
mm thick, consisting of peripheral echogenic material at fundus and
posteriorly as well as heterogeneous isoechoic to hypoechoic
material which likely represents blood clot. No definite internal
blood flow is seen within any of the material distending the
endometrial canal.

Right ovary

Measurements: 2.5 x 1.6 x 1.4 cm = volume: 2.9 mL. Normal morphology
without mass

Left ovary

Measurements: 3.4 x 2.9 x 2.7 cm = volume: 14.0 mL. Normal
morphology without mass

Other findings:  No free pelvic fluid.  No adnexal masses.
IMPRESSION: Endometrial canal distended 28 mm thick by a combination of complex
isoechoic to hypoechoic material as well as more echogenic material
at the superior and posterior aspects superiorly.

Findings likely represent a large amount of hemorrhage/clot within
the endometrial canal with suspicion of coexistent retained products
of conception.

## 2022-06-13 NOTE — Progress Notes (Signed)
    SUBJECTIVE:   CHIEF COMPLAINT / HPI:   Desiring pregnancy/amenorrhea- still breastfeeding, stopped POPs 4 months ago and no period since. Would like pregnancy test today. Sometimes has dysuria also wondering if could test for infection, no blood in urine, no fevers, no chills.  Joint pains- still taking naprosen BID which helps. Has number for rheumatologist but wondering if they can be seen sooner than February, wanted to ask me. Wondering if this is safe if she is trying to get pregnant.  OBJECTIVE:   BP 128/73   Pulse 95   Wt 179 lb 3.2 oz (81.3 kg)   SpO2 100%   BMI 32.78 kg/m   General: alert & oriented, no apparent distress, well groomed HEENT: normocephalic, atraumatic, EOM grossly intact, oral mucosa moist, neck supple Respiratory: normal respiratory effort GI: non-distended, no guarding or rebound, no suprapubic tenderness Skin: no rashes, no jaundice Psych: appropriate mood and affect   ASSESSMENT/PLAN:   Amenorrhea Likely due to breastfeeding, pregnancy test negative today, discussed PNV  Arthralgia Of bilateral hands, naprosyn helps, discussed tylenol as better option if trying to conceive Has rheumatology number to call and schedule appt     Billey Co, MD [facility]

## 2022-06-20 ENCOUNTER — Ambulatory Visit: Payer: Medicaid Other | Admitting: Family Medicine

## 2022-06-20 ENCOUNTER — Other Ambulatory Visit: Payer: Self-pay

## 2022-06-20 ENCOUNTER — Encounter: Payer: Self-pay | Admitting: Family Medicine

## 2022-06-20 VITALS — BP 128/73 | HR 95 | Wt 179.2 lb

## 2022-06-20 DIAGNOSIS — N912 Amenorrhea, unspecified: Secondary | ICD-10-CM

## 2022-06-20 DIAGNOSIS — M25542 Pain in joints of left hand: Secondary | ICD-10-CM | POA: Diagnosis not present

## 2022-06-20 DIAGNOSIS — M25541 Pain in joints of right hand: Secondary | ICD-10-CM | POA: Diagnosis not present

## 2022-06-20 DIAGNOSIS — N823 Fistula of vagina to large intestine: Secondary | ICD-10-CM | POA: Diagnosis not present

## 2022-06-20 DIAGNOSIS — R12 Heartburn: Secondary | ICD-10-CM

## 2022-06-20 DIAGNOSIS — R300 Dysuria: Secondary | ICD-10-CM

## 2022-06-20 LAB — POCT URINALYSIS DIP (MANUAL ENTRY)
Bilirubin, UA: NEGATIVE
Glucose, UA: NEGATIVE mg/dL
Ketones, POC UA: NEGATIVE mg/dL
Leukocytes, UA: NEGATIVE
Nitrite, UA: NEGATIVE
Protein Ur, POC: NEGATIVE mg/dL
Spec Grav, UA: 1.025 (ref 1.010–1.025)
Urobilinogen, UA: 0.2 E.U./dL
pH, UA: 6 (ref 5.0–8.0)

## 2022-06-20 LAB — POCT URINE PREGNANCY: Preg Test, Ur: NEGATIVE

## 2022-06-20 MED ORDER — FAMOTIDINE 20 MG PO TABS: 20.0000 mg | ORAL_TABLET | Freq: Every day | ORAL | 3 refills | Status: DC

## 2022-06-20 MED ORDER — POLYETHYLENE GLYCOL 3350 17 GM/SCOOP PO POWD: 17.0000 g | Freq: Three times a day (TID) | ORAL | 11 refills | Status: DC

## 2022-06-20 NOTE — Assessment & Plan Note (Addendum)
Likely due to breastfeeding, pregnancy test negative today, discussed PNV

## 2022-06-20 NOTE — Assessment & Plan Note (Signed)
Of bilateral hands, naprosyn helps, discussed tylenol as better option if trying to conceive Has rheumatology number to call and schedule appt

## 2022-06-20 NOTE — Patient Instructions (Addendum)
It was wonderful to see you today.  Please bring ALL of your medications with you to every visit.   Today we talked about:  - checked urine pregnancy was negative - no signs of infection - You can call the rheumatologist to make an appt  You can switch to tylenol 1000mg  twice daily instead of naprosyn as you are trying to get pregnant   Thank you for choosing [facility].   Please call [phone] with any questions about today's appointment.  Please be sure to schedule follow up in 6 weeks at the front  desk before you leave today.   Please arrive at least 15 minutes prior to your scheduled appointments.   If you had blood work today, I will send you a MyChart message or a letter if results are normal. Otherwise, I will give you a call.   If you had a referral placed, they will call you to set up an appointment. Please give [phone] a call if you don't hear back in the next 2 weeks.   If you need additional refills before your next appointment, please call your pharmacy first.   [place], MD  Family Medicine

## 2022-06-21 LAB — URINALYSIS, MICROSCOPIC ONLY
Bacteria, UA: NONE SEEN
Casts: NONE SEEN /lpf
WBC, UA: NONE SEEN /hpf (ref 0–5)

## 2022-08-15 ENCOUNTER — Telehealth: Payer: Self-pay

## 2022-08-15 NOTE — Telephone Encounter (Signed)
Patients husband calls nurse line requesting an apt.   He reports a positive home pregnancy test ~ 10 days ago. He reports her LMP was 07/01/2022.   He does report she has been having some abdominal pain. He denies "severe" pain, bleeding or fevers.   Patient scheduled for 2/23 for pregnancy confirmation.   Advised to go to MAU if abdominal worsens and/or bleeding occurs.

## 2022-08-16 ENCOUNTER — Ambulatory Visit: Payer: Medicaid Other | Admitting: Family Medicine

## 2022-08-17 ENCOUNTER — Ambulatory Visit: Payer: Medicaid Other | Admitting: Student

## 2022-08-17 ENCOUNTER — Encounter: Payer: Self-pay | Admitting: Student

## 2022-08-17 VITALS — BP 120/80 | HR 81 | Wt 177.2 lb

## 2022-08-17 DIAGNOSIS — Z3A01 Less than 8 weeks gestation of pregnancy: Secondary | ICD-10-CM

## 2022-08-17 DIAGNOSIS — Z3490 Encounter for supervision of normal pregnancy, unspecified, unspecified trimester: Secondary | ICD-10-CM

## 2022-08-17 DIAGNOSIS — N912 Amenorrhea, unspecified: Secondary | ICD-10-CM | POA: Diagnosis not present

## 2022-08-17 HISTORY — DX: Encounter for supervision of normal pregnancy, unspecified, unspecified trimester: Z34.90

## 2022-08-17 LAB — POCT URINE PREGNANCY: Preg Test, Ur: POSITIVE — AB

## 2022-08-17 NOTE — Assessment & Plan Note (Addendum)
Pregnancy confirmed in office today. Obtained initial OB labs and scheduled for first OB appointment 3/1 at 1:50 PM with Dr. Ronnald Ramp.  - letter faxed to dental office for them to continue care during pregnancy  - [facility] information given to patient

## 2022-08-17 NOTE — Patient Instructions (Addendum)
????? ????? OB ????? ? ??????? ?? 1:45 ??? 3/1 ??.  ? WIC ????? ???? ???? ?? ? ?? ?????? ?????? ???? ????? ? ?????? ????? ?? ??? ??. ???? ???? ? ???? ?????? ???????? ??? ?? ?? ????.  ?? ?? ????? ? ?????? ???? ?? ?? ??? ??? ??? ???? ???? ?????????? ????.  ?? ? Pepcid ????? ???? ?? ????? ?? ???? ?? ????? ? ???? ??????? ?? ????? ???? ?? ?? ????????? ?? ????? ??. ???? ???? ?? ? ??? ??? ????? ??????? ?? ?????.  ? ????????? ????? ????? ?????? ??????? ?????? ????? ???/?????? ?? ?? ?? ????????? ?? ??? ?????? ?? ?? ???? ? ????? ???? ??? ?????? ?? ????? ????: ?? ???? ????? ?? ? ??? ?? ???????? ?? ???? ?? MAU ?? ??? ?? ??: ? ???? ??? ?? ?????? ??? ?? ? ???? ???? ??? ?? ??? ?? ?????? ???? ?? ??? ? 30 ????? ??? ?? 1.5 ????? ???? ???? ?? ?? 5-10 ????? ?? ? ?? ???? ?? ??? ????? ???? ?? ??? ?? ?????? ???? ?? ???? ? ?? ?? ???? ???? ?????? ??? ????. ? ??????/??? ??????. ? ????? ???? ??????. ???? ?????? ?? ? ??????? ??? ????? ??? ???? ?????? ?? ????? ?? ??? ?? ?? ????? ??? ???? ???? ??? ?? ????? ??? ????? ???. ? ???? ? ????? ??????? ???. ? ???? ????? ?? ??? ?? ????? ????? ? ?????? ?? ??? ???? ???. ?? ???? ???? ?? ???? ? ????? ?? ???? ????? ?? ?? ????? (?? ?? ?? ?? ? ??? ??? ?? ?? ??? ? ???? ???? ?? ???) ?? ???? ?? ?? ? ??? ????? ???? ????? ???? ????? ????. ?? ????? ????? ???? ? ?????? ?? ??? ???? ?? ???? ???? ???? MAU ?? ??? ??. ???? ???? ????? ???? ?? ????? ????? ?? ?? ???? ?? 6 ??? ???? ???? ?? ?? ??? ??????? ?? 10 ???. ? ???? ???????? ?? ??? ??? ?? ? 1 ???? ??????? ??? ?? ????? ?? ??? ? ??? ?????? ? ???? ???? ??? ??????? ?? ??????? ????? ?? ?? ???? ?? ??? ???? ? ??? ????? ??????? - ?? ??? ? ????????? ?? ????? ?? ??? ?? ? ???? ??? ???? ??? ????? ???? ??. ? ???? ????? ???? ??????? ????? ????  ?? ??? ? ????????? ?? ??? ?? ?? ?? ???? ?? ?? ??? ??? ?? ??? ?? ??????? ??? ?? ???? ??? PCP ?? ??? ???? ?? ? ????? ? ????? ????? (????? ????) ?? ? ???? ?????? ????? ???? ???.  ? ????????? ????? ?? ??? ????? ???? ?????? ???????  ???? ? ???? ????? ?????? ?? ? ??? ?? ???????? ?? ???? ?? ? ????? ? ????? ????? ?? ??? ??. ???? ?? ? ?????? ? ?????? C ??? ??? ?????.      Your first OB visit is 3/1 at 1:45 PM.   For [facility] you can go to th department of health to obtain these services. You may also be eligible with your current children.   I will fax a note to your dentist office so you can have the procedures.   I also sent in a prescription for Pepcid that can help calm your stomach that is safe in pregnancy. You can also take tylenol for your pain.   Pregnancy Related Return Precautions The follow are signs/symptoms that are abnormal in pregnancy and may require further evaluation by a physician: Go to the MAU at [place] at [facility] if: You have cramping/contractions that do not go away with drinking water, especially if  they are lasting 30 seconds to 1.5 minutes, coming and going every 5-10 minutes for an hour or more, or are getting stronger and you cannot walk or talk while having a contraction/cramp. Your water breaks.  Sometimes it is a big gush of fluid, sometimes it is just a trickle that keeps getting your underwear wet or running down your legs You have vaginal bleeding.    You do not feel your baby moving like normal.  If you do not, get something to eat and drink (something cold or something with sugar like peanut butter or juice) and lay down and focus on feeling your baby move. If your baby is still not moving like normal, you should go to MAU. You should feel your baby move 6 times in one hour, or 10 times in two hours. You have a persistent headache that does not go away with 1 g of Tylenol, vision changes, chest pain, difficulty breathing, severe pain in your right upper abdomen, worsening leg swelling- these can all be signs of high blood pressure in pregnancy and need to be evaluated by a provider immediately  These are all concerning in pregnancy and if you have any of these I  recommend you call your PCP and present to the Maternity Admissions Unit (map below) for further evaluation.  For any pregnancy-related emergencies, please go to the Maternity Admissions Unit in the [place] at [place] will use hospital Entrance C.    Our clinic number is [phone].   Dr Thompson Grayer

## 2022-08-17 NOTE — Progress Notes (Signed)
    SUBJECTIVE:   CHIEF COMPLAINT / HPI:   Kelsey Mcdaniel is a 27 y.o. female  [id number] presenting for confirmation of pregnancy. LMP 07/01/2022. Appeared to be a regular for her, previously she had been amenorrheic s/p birth control. She had a positive home pregnancy test earlier this month. She reports having some intermittent abdominal pain that related to her acid reflux. She is wanting to continue with this pregnancy. She denies severe abdominal pain, vaginal bleeding, headaches, or vision changes.   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 S/P perineoplasty 123XX123 complicated by postoperative wound dehiscence and repeat perineoplasty 04/2021.   She has two children, ages 56 and 88 years old. They are currently receiving WIC services.   PERTINENT  PMH / PSH: reviewed   OBJECTIVE:   BP 120/80   Pulse 81   Wt 177 lb 3.2 oz (80.4 kg)   LMP 06/25/2022 (Exact Date)   SpO2 98%   BMI 32.41 kg/m   Well-appearing, no acute distress Cardio: Regular rate, regular rhythm, no murmurs on exam. Pulm: Clear, no wheezing, no crackles. No increased work of breathing Abdominal: bowel sounds present, soft, non-tender, non-distended Extremities: no peripheral edema    ASSESSMENT/PLAN:   Pregnancy Pregnancy confirmed in office today. Obtained initial OB labs and scheduled for first OB appointment 3/1 at 1:50 PM with Dr. Ronnald Ramp.  - letter faxed to dental office for them to continue care during pregnancy  - [place] information given to patient      Darci Current, [place]

## 2022-08-18 LAB — CBC
Hematocrit: 35.9 % (ref 34.0–46.6)
Hemoglobin: 11.4 g/dL (ref 11.1–15.9)
MCH: 26.8 pg (ref 26.6–33.0)
MCHC: 31.8 g/dL (ref 31.5–35.7)
MCV: 85 fL (ref 79–97)
Platelets: 350 10*3/uL (ref 150–450)
RBC: 4.25 x10E6/uL (ref 3.77–5.28)
RDW: 13.4 % (ref 11.7–15.4)
WBC: 12 10*3/uL — ABNORMAL HIGH (ref 3.4–10.8)

## 2022-08-18 LAB — URINALYSIS
Bilirubin, UA: NEGATIVE
Glucose, UA: NEGATIVE
Nitrite, UA: NEGATIVE
RBC, UA: NEGATIVE
Specific Gravity, UA: 1.027 (ref 1.005–1.030)
Urobilinogen, Ur: 1 mg/dL (ref 0.2–1.0)
pH, UA: 7 (ref 5.0–7.5)

## 2022-08-18 LAB — HEPATITIS C ANTIBODY: Hep C Virus Ab: NONREACTIVE

## 2022-08-18 LAB — MEASLES/MUMPS/RUBELLA IMMUNITY
MUMPS ABS, IGG: 274 AU/mL (ref >10.9)
RUBEOLA AB, IGG: >300 AU/mL (ref >16.4)
Rubella Antibodies, IGG: 23.2 index (ref >0.99)

## 2022-08-18 LAB — HEPATITIS B SURFACE ANTIGEN: Hepatitis B Surface Ag: NEGATIVE

## 2022-08-18 LAB — ANTIBODY SCREEN: Antibody Screen: NEGATIVE

## 2022-08-18 LAB — RPR: RPR Ser Ql: NONREACTIVE

## 2022-08-18 LAB — ABO/RH: Rh Factor: POSITIVE

## 2022-08-18 LAB — HIV ANTIBODY (ROUTINE TESTING W REFLEX): HIV Screen 4th Generation wRfx: NONREACTIVE

## 2022-08-19 LAB — CULTURE, OB URINE

## 2022-08-19 LAB — URINE CULTURE, OB REFLEX

## 2022-08-23 NOTE — Progress Notes (Deleted)
Patient Name: Kelsey Mcdaniel Date of Birth: [birth year] [place] Initial Prenatal Visit  Kelsey Mcdaniel is a 27 y.o. year old G3P2002 at Unknown who presents for her initial prenatal visit. Pregnancy {Is/is not:9024} planned She reports {pregnancy symptoms:18128}. She {is/is not:320031::"is"} taking a prenatal vitamin.  She denies pelvic pain or vaginal bleeding.   Pregnancy Dating: The patient is dated by ***.  LMP: 123XX123 Period is certain:  Yes.  Periods were regular:  *** LMP was a typical period:  Yes.  Using hormonal contraception in 3 months prior to conception: {yes/no:20286}  Lab Review: Blood type: A Rh Status: + Antibody screen: Negative HIV: Negative RPR: Negative Hemoglobin electrophoresis reviewed: Yes- from 2021 Results of OB urine culture are: Negative Rubella: Immune Hep C Ab: Negative Varicella status is Immune  PMH: Reviewed and as detailed below: HTN: {yes/no:20286::"No"}  Gestational Hypertension/preeclampsia: {yes/no:20286::"No"}  Type 1 or 2 Diabetes: {yes/no:20286::"No"}  Depression:  {yes/no:20286::"No"}  Seizure disorder:  {yes/no:20286::"No"} VTE: {yes/no:20286::"No"} ,  History of STI {yes/no:20286::"No"},  Abnormal Pap smear:  No, last pap smear in 2022 normal  Genital herpes simplex:  No   PSH: Gynecologic Surgery: Patient had fourth degree tear after last birth and is s/p perineoplasty October 2022 followed by wound dehiscence and repeat perineoplasty in November 2022. Surgical history reviewed, notable for: ***  Obstetric History: Obstetric history tab updated and reviewed.  Summary of prior pregnancies: *** Cesarean delivery: {yes/no:20286::"No"}  Gestational Diabetes:  {yes/no:20286::"No"} Hypertension in pregnancy: {yes/no:20286::"No"} History of preterm birth: {yes/no:20286::"No"} History of LGA/SGA infant:  {yes/no:20286::"No"} History of shoulder dystocia: {yes/no:20286::"No"} Indications for referral were  reviewed, and the patient has no obstetric indications for referral to [facility] at this time.   Social History: Partner's name: ***  Tobacco use: {yes/no:20286::"No"} Alcohol use:  {yes/no:20286::"No"} Other substance use:  {yes/no:20286::"No"}  Current Medications:  ***  Reviewed and appropriate in pregnancy.   Genetic and Infection Screen: Flow Sheet Updated {yes/no:20286::"Yes"}  Prenatal Exam: Gen: Well nourished, well developed.  No distress.  Vitals noted. HEENT: Normocephalic, atraumatic.  Neck supple without cervical lymphadenopathy, thyromegaly or thyroid nodules.  Fair dentition. CV: RRR no murmur, gallops or rubs Lungs: CTA B.  Normal respiratory effort without wheezes or rales. Abd: soft, NTND. +BS.  Uterus not appreciated above pelvis. GU: Normal external female genitalia without lesions.  Nl vaginal, well rugated without lesions. No vaginal discharge.  Bimanual exam: No adnexal mass or TTP. No CMT.  Uterus size *** Ext: No clubbing, cyanosis or edema. Psych: Normal grooming and dress.  Not depressed or anxious appearing.  Normal thought content and process without flight of ideas or looseness of associations  Fetal heart tones: {appropriate:23337::"Appropriate"}  Assessment/Plan:  Dairin Reburn is a 27 y.o. G2P2002 at Unknown who presents to initiate prenatal care. She is doing well.  Current pregnancy issues include ***.  Routine prenatal care: As dating {ACTION; IS/IS NOT:21021397::"is not"} reliable, a dating ultrasound {HAS HAS NOT:18834::"has"} been ordered. Dating tab updated. Pre-pregnancy weight updated. Expected weight gain this pregnancy is {weight gain pregnancy :23296::"25-35 pounds "} Prenatal labs reviewed, notable for ***. Indications for referral to [facility] were reviewed and the patient {DOES NOT does:27190::"does not"} meet criteria for referral.  Medication list reviewed and updated.  Recommended patient see a dentist for regular care.   Bleeding and pain precautions reviewed. Importance of prenatal vitamins reviewed.  Genetic screening offered. Patient opted for: {obgeneticscreen:23414}. The patient has the following indications for aspirinto begin 81 mg at 12-16 weeks: One high  risk condition: {fmcaspirinobhigh:26167} MORE than one moderate risk condition: {fmcaspirinobmoderate:26168} Aspirin {WAS/WAS NOT:[phone]::"was not"}  recommended today based upon above risk factors (one high risk condition or more than one moderate risk factor)  The patient {will/will not be:23415} age 4 or over at time of delivery. Referral to genetic counseling {WAS/WAS NOT:[phone]::"was not"} offered today.  The patient has the following risk factors for preexisting diabetes: {Pre-existing diabetes screening:23343::"Reviewed indications for early 1 hour glucose testing, not indicated "}. An early 1 hour glucose tolerance test {WAS/WAS NOT:[phone]::"was not"} ordered. Pregnancy Medical Home and PHQ-9 forms completed, problems noted: {yes/no:20286}  2. Pregnancy issues include the following which were addressed today:  ***   Follow up 4 weeks for next prenatal visit.

## 2022-08-24 ENCOUNTER — Encounter: Payer: Medicaid Other | Admitting: Student

## 2022-09-06 ENCOUNTER — Ambulatory Visit (INDEPENDENT_AMBULATORY_CARE_PROVIDER_SITE_OTHER): Payer: Medicaid Other | Admitting: Family Medicine

## 2022-09-06 ENCOUNTER — Other Ambulatory Visit: Payer: Self-pay

## 2022-09-06 ENCOUNTER — Encounter: Payer: Self-pay | Admitting: Family Medicine

## 2022-09-06 VITALS — BP 122/84 | HR 85 | Wt 177.2 lb

## 2022-09-06 DIAGNOSIS — Z3A10 10 weeks gestation of pregnancy: Secondary | ICD-10-CM

## 2022-09-06 DIAGNOSIS — N823 Fistula of vagina to large intestine: Secondary | ICD-10-CM

## 2022-09-06 MED ORDER — COMPLETENATE 29-1 MG PO CHEW: 1.0000 | CHEWABLE_TABLET | Freq: Every day | ORAL | 1 refills | Status: DC

## 2022-09-06 MED ORDER — POLYETHYLENE GLYCOL 3350 17 GM/SCOOP PO POWD: 17.0000 g | Freq: Three times a day (TID) | ORAL | 0 refills | Status: DC

## 2022-09-06 MED ORDER — DOXYLAMINE-PYRIDOXINE 10-10 MG PO TBEC: DELAYED_RELEASE_TABLET | ORAL | 0 refills | Status: DC

## 2022-09-06 NOTE — Progress Notes (Signed)
Patient Name: Kelsey Mcdaniel Date of Birth: Mar 31, 1996 [place] Initial Prenatal Visit  Kelsey Mcdaniel is a 27 y.o. year old G3P2002 at Unknown who presents for her initial prenatal visit. Pregnancy is planned She reports fatigue, nausea, and vomiting. Vomiting 1-2 times per day consistently. Fasting for Ramadan until April 11th.  She is not taking a prenatal vitamin.  She denies pelvic pain or vaginal bleeding.   Pregnancy Dating: The patient is dated by LMP.  LMP: January 1st, 99991111 Period is certain:  Yes.  Periods were regular:  No.  LMP was a typical period:  Yes.  Using hormonal contraception in 3 months prior to conception: Yes- stopped POPs in December  Lab Review: Blood type: A Rh Status: + Antibody screen: Negative HIV: Negative RPR: Negative Hemoglobin electrophoresis reviewed: Yes, normal Results of OB urine culture are: Negative Rubella: Immune Hep C Ab: Negative Varicella status is Immune  PMH: Reviewed and as detailed below: HTN: No  Gestational Hypertension/preeclampsia: Yes- gHTN Type 1 or 2 Diabetes: No  Depression:  No  Seizure disorder:  No VTE: No ,  History of STI No,  Abnormal Pap smear:  No, Genital herpes simplex:  No   PSH: Gynecologic Surgery:  yes- perineoplasty 03/2021 and 04/2021 Surgical history reviewed.  Obstetric History: Obstetric history tab updated and reviewed.  Summary of prior pregnancies: [id number]  G1: 2019 in [place]. Gestational HTN, was not on meds. Term vaginal delivery, breech delivery, 4th degree laceration with rectovaginal fistula. 12lb baby.  G2: 2022. Term vaginal delivery- 4th degree laceration. Cesarean delivery: No  Gestational Diabetes:  No Hypertension in pregnancy: Yes History of preterm birth: No History of LGA/SGA infant:  Yes- 1st baby 12lbs, breech delivery History of shoulder dystocia: No Indications for referral were reviewed, and the patient has no obstetric indications for  referral to [facility] at this time. Will need referral later for c-section consultation due to h/o 4th degree lac with perineoplasty and rectovaginal fistula.  Social History: Partner's name: Programmer, systems Tobacco use: No Alcohol use:  No Other substance use:  No  Current Medications:  Famotidine '20mg'$  daily Miralax prn- requests refill Reviewed and appropriate in pregnancy.   Genetic and Infection Screen: Flow Sheet Updated Yes  Prenatal Exam: Gen: Well nourished, well developed.  No distress.  Vitals noted. HEENT: Normocephalic, atraumatic. CV: RRR no murmur, gallops or rubs Lungs: CTA B.  Normal respiratory effort without wheezes or rales. Abd: soft, NTND. Uterus not appreciated above pelvis. GU: deferred Ext: No clubbing, cyanosis or edema. Psych: Normal grooming and dress.  Not depressed or anxious appearing.  Normal thought content and process without flight of ideas or looseness of associations  Fetal heart tones: did not attempt today  Assessment/Plan:  Kaetlin Lasher is a 27 y.o. G3P2002 at Unknown (16w3dby unreliable LMP) who presents to initiate prenatal care.   Routine prenatal care: As dating is not reliable, a dating ultrasound has been ordered. Dating tab updated. Pre-pregnancy weight updated. Expected weight gain this pregnancy is 15-25 pounds Prenatal labs reviewed, no notable findings. Indications for referral to [facility]OB were reviewed and the patient does not meet criteria for referral at this time. Medication list reviewed and updated.  Bleeding and pain precautions reviewed. Importance of prenatal vitamins reviewed.  Genetic screening offered. Patient opted for: no screening. The patient has the following indications for aspirin to begin 81 mg at 12-16 weeks: One high risk condition: no single high risk condition  MORE than  one moderate risk condition: obesity and low SES   Aspirin was recommended today based upon above risk factors (one high  risk condition or more than one moderate risk factor). She will plan to start at next visit. Please prescribe/discuss ASA again at next visit. The patient will not be age 72 or over at time of delivery. Referral to genetic counseling was not offered today.  The patient has the following risk factors for preexisting diabetes: BMI > 25 and sedentary lifestyle. An early 1 hour glucose tolerance test was not ordered today as patient declines-- wants to do at next visit. Patient declined pelvic exam and GC/chlamydia testing today. Would like to do at next visit instead Vaccinations: patient declines flu and COVID today.   2. 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 Will need referral to [facility] in 3rd trimester History of gHTN- in 1st pregnancy. BP wnl today. Monitor. Language barrier: in-person Pashto interpreter, Hyacinth Meeker, present for duration of encounter  At next visit: GC/chlamydia, early 1hr, start ASA '81mg'$  daily  Follow up 4 weeks for next prenatal visit.

## 2022-09-06 NOTE — Patient Instructions (Addendum)
It was great to see you!  I have ordered an ultrasound. See appointment information below.  Start taking a prenatal vitamin once daily. I also sent a prescription medication for nausea/vomiting. Take 2 tablets at bedtime. If still having symptoms on day 3 increase to 1 tablet in the morning and 2 tablets at bedtime.  See [place] back in 4 weeks. At that visit we will do a pelvic exam, your 1 hour sugar test, and will start a baby aspirin.  Seek help right away if you develop vaginal bleeding, severe abdominal pain, or any other concerns.  Take care, Dr Rock Nephew

## 2022-10-05 ENCOUNTER — Encounter (HOSPITAL_COMMUNITY): Payer: Self-pay | Admitting: Obstetrics and Gynecology

## 2022-10-05 ENCOUNTER — Inpatient Hospital Stay (HOSPITAL_COMMUNITY)
Admission: AD | Admit: 2022-10-05 | Discharge: 2022-10-05 | Disposition: A | Discharge status: Home / Self Care | Payer: Medicaid Other | Attending: Obstetrics and Gynecology | Admitting: Obstetrics and Gynecology

## 2022-10-05 ENCOUNTER — Inpatient Hospital Stay (HOSPITAL_COMMUNITY): Payer: Medicaid Other

## 2022-10-05 DIAGNOSIS — Z3A14 14 weeks gestation of pregnancy: Secondary | ICD-10-CM | POA: Insufficient documentation

## 2022-10-05 DIAGNOSIS — O26899 Other specified pregnancy related conditions, unspecified trimester: Secondary | ICD-10-CM

## 2022-10-05 DIAGNOSIS — O209 Hemorrhage in early pregnancy, unspecified: Secondary | ICD-10-CM | POA: Diagnosis not present

## 2022-10-05 DIAGNOSIS — R109 Unspecified abdominal pain: Secondary | ICD-10-CM

## 2022-10-05 DIAGNOSIS — O26892 Other specified pregnancy related conditions, second trimester: Secondary | ICD-10-CM | POA: Insufficient documentation

## 2022-10-05 LAB — CBC WITH DIFFERENTIAL/PLATELET
Abs Immature Granulocytes: 0.04 10*3/uL (ref 0.00–0.07)
Basophils Absolute: 0.1 10*3/uL (ref 0.0–0.1)
Basophils Relative: 0 %
Eosinophils Absolute: 0.2 10*3/uL (ref 0.0–0.5)
Eosinophils Relative: 2 %
HCT: 29.9 % — ABNORMAL LOW (ref 36.0–46.0)
Hemoglobin: 10 g/dL — ABNORMAL LOW (ref 12.0–15.0)
Immature Granulocytes: 0 %
Lymphocytes Relative: 31 %
Lymphs Abs: 3.9 10*3/uL (ref 0.7–4.0)
MCH: 27.5 pg (ref 26.0–34.0)
MCHC: 33.4 g/dL (ref 30.0–36.0)
MCV: 82.4 fL (ref 80.0–100.0)
Monocytes Absolute: 0.8 10*3/uL (ref 0.1–1.0)
Monocytes Relative: 6 %
Neutro Abs: 7.5 10*3/uL (ref 1.7–7.7)
Neutrophils Relative %: 61 %
Platelets: 323 10*3/uL (ref 150–400)
RBC: 3.63 MIL/uL — ABNORMAL LOW (ref 3.87–5.11)
RDW: 13.3 % (ref 11.5–15.5)
WBC: 12.5 10*3/uL — ABNORMAL HIGH (ref 4.0–10.5)
nRBC: 0 % (ref 0.0–0.2)

## 2022-10-05 LAB — URINALYSIS, ROUTINE W REFLEX MICROSCOPIC
Bilirubin Urine: NEGATIVE
Glucose, UA: 150 mg/dL — AB
Hgb urine dipstick: NEGATIVE
Ketones, ur: NEGATIVE mg/dL
Leukocytes,Ua: NEGATIVE
Nitrite: NEGATIVE
Protein, ur: NEGATIVE mg/dL
Specific Gravity, Urine: 1.024 (ref 1.005–1.030)
pH: 5 (ref 5.0–8.0)

## 2022-10-05 MED ORDER — ACETAMINOPHEN 500 MG PO TABS
1000.0000 mg | ORAL_TABLET | Freq: Once | ORAL | Status: AC
  Administered 2022-10-05: 1000 mg via ORAL
  Filled 2022-10-05: qty 2

## 2022-10-05 NOTE — MAU Note (Addendum)
.  Kelsey Mcdaniel is a 27 y.o. at [redacted]w[redacted]d here in MAU reporting abdominal pain since 1700 which has gotten worse. Pain is crampy. Denies vomiting or diarrhea. Occ nausea from pregnancy that is not new. Denies VB or LOF  Onset of complaint: 1700 Pain score: 9 Vitals:   10/05/22 0031 10/05/22 0033  BP:  125/77  Pulse: (!) 102   Resp: 18   Temp: 98.2 F (36.8 C)   SpO2: 100%      FHT:154 Lab orders placed from triage:  u/a

## 2022-10-05 NOTE — Progress Notes (Addendum)
Written and verbal d/c instructions given and pt voiced understanding. Pt signed paper copy of d/c paper. After initially being able to get interpreter on video but lost connection, was never able to get interpreter again. Pt does speak some English and voiced understanding of d/c plan

## 2022-10-05 NOTE — MAU Note (Signed)
Was using video interpreter but then connection lost during conversation and unable to get another interpreter. Pt speaks some English and was able to communicate with understanding voiced.

## 2022-10-05 NOTE — MAU Provider Note (Signed)
History     CSN: [id number]  Arrival date and time: 10/05/22 0017   Event Date/Time   First Provider Initiated Contact with Patient 10/05/22 0125      Chief Complaint  Patient presents with   Abdominal Pain   HPI  Ms.Kelsey Mcdaniel Is a [id number] @ [redacted]w[redacted]d here with abdominal pain. The pain is all over her abdomen. She reports the pain started at 1700 yesterday and worsened at 2200. No bleeding. No diarrhea or vomiting. No fever.  Some minor constipation however had a BM today.  She had this pain 2 days ago for 1-2 hours.     OB History     Gravida  3   Para  2   Term  2   Preterm  0   AB  0   Living  2      SAB      IAB      Ectopic      Multiple  0   Live Births  2           Past Medical History:  Diagnosis Date   Cloacal malformation    History of pregnancy induced hypertension     Past Surgical History:  Procedure Laterality Date   PERINEOPLASTY N/A 04/10/2021   Procedure: PERINEOPLASTY;  Surgeon: Marguerita Beards, MD;  Location: [facility];  Service: Gynecology;  Laterality: N/A;   PERINEOPLASTY N/A 04/27/2021   Procedure: PERINEOPLASTY;  Surgeon: Marguerita Beards, MD;  Location: [facility];  Service: Gynecology;  Laterality: N/A;  total time requested is 1 hour   RECTOVAGINAL FISTULA CLOSURE  2021    Family History  Problem Relation Age of Onset   Heart disease Father     Social History   Tobacco Use   Smoking status: Never   Smokeless tobacco: Never  Vaping Use   Vaping Use: Never used  Substance Use Topics   Alcohol use: Never   Drug use: Never    Allergies: No Known Allergies  Medications Prior to Admission  Medication Sig Dispense Refill Last Dose   Doxylamine-Pyridoxine (DICLEGIS) 10-10 MG TBEC Take 2 tablets at bedtime. If still having symptoms after 3 days, increase to 1 tablet in the morning and 2 tablets at bedtime. 90 tablet 0 10/04/2022   polyethylene glycol powder  (GLYCOLAX/MIRALAX) 17 GM/SCOOP powder Take 17 g by mouth in the morning, at noon, and at bedtime. 850 g 0 Past Month   prenatal vitamin w/FE, FA (NATACHEW) 29-1 MG CHEW chewable tablet Chew 1 tablet by mouth daily at 12 noon. 90 tablet 1 10/04/2022   famotidine (PEPCID) 20 MG tablet Take 1 tablet (20 mg total) by mouth daily. (Patient not taking: Reported on 10/05/2022) 90 tablet 3 Not Taking   lidocaine (XYLOCAINE) 5 % ointment Apply 1 Application topically as needed. 35.44 g 0    Recent Results (from the past 2160 hour(s))  POCT urine pregnancy     Status: Abnormal   Collection Time: 08/17/22 11:24 AM  Result Value Ref Range   Preg Test, Ur Positive (A) Negative  Culture, OB Urine     Status: None   Collection Time: 08/17/22  2:53 PM   Specimen: Urine   UC  Result Value Ref Range   Urine Culture, OB Final report   Urine Culture, OB Reflex     Status: None   Collection Time: 08/17/22  2:53 PM  Result Value Ref Range   Organism ID, Bacteria Comment  Comment: Mixed urogenital flora 50,000-100,000 colony forming units per mL   Urinalysis     Status: Abnormal   Collection Time: 08/17/22  2:56 PM  Result Value Ref Range   Specific Gravity, UA 1.027 1.005 - 1.030   pH, UA 7.0 5.0 - 7.5   Color, UA Yellow Yellow   Appearance Ur Cloudy (A) Clear   Leukocytes,UA Trace (A) Negative   Protein,UA Trace Negative/Trace   Glucose, UA Negative Negative   Ketones, UA Trace (A) Negative   RBC, UA Negative Negative   Bilirubin, UA Negative Negative   Urobilinogen, Ur 1.0 0.2 - 1.0 mg/dL   Nitrite, UA Negative Negative  CBC     Status: Abnormal   Collection Time: 08/17/22  3:54 PM  Result Value Ref Range   WBC 12.0 (H) 3.4 - 10.8 x10E3/uL   RBC 4.25 3.77 - 5.28 x10E6/uL   Hemoglobin 11.4 11.1 - 15.9 g/dL   Hematocrit 16.1 09.6 - 46.6 %   MCV 85 79 - 97 fL   MCH 26.8 26.6 - 33.0 pg   MCHC 31.8 31.5 - 35.7 g/dL   RDW 04.5 40.9 - 81.1 %   Platelets 350 150 - 450 x10E3/uL  HIV antibody  (with reflex)     Status: None   Collection Time: 08/17/22  3:54 PM  Result Value Ref Range   HIV Screen 4th Generation wRfx Non Reactive Non Reactive    Comment: HIV Negative HIV-1/HIV-2 antibodies and HIV-1 p24 antigen were NOT detected. There is no laboratory evidence of HIV infection.   RPR     Status: None   Collection Time: 08/17/22  3:54 PM  Result Value Ref Range   RPR Ser Ql Non Reactive Non Reactive  ABO/Rh     Status: None   Collection Time: 08/17/22  3:54 PM  Result Value Ref Range   ABO Grouping A    Rh Factor Positive     Comment: Please note: Prior records for this patient's ABO / Rh type are not available for additional verification.   Antibody screen     Status: None   Collection Time: 08/17/22  3:54 PM  Result Value Ref Range   Antibody Screen Negative Negative  Measles/Mumps/Rubella Immunity     Status: None   Collection Time: 08/17/22  3:54 PM  Result Value Ref Range   Rubella Antibodies, IGG 23.20 Immune >0.99 index    Comment:                                 Non-immune       <0.90                                 Equivocal  0.90 - 0.99                                 Immune           >0.99    RUBEOLA AB, IGG >300.0 Immune >16.4 AU/mL    Comment:                                  Negative        <13.5  Equivocal 13.5 - 16.4                                  Positive        >16.4 Presence of antibodies to Rubeola is presumptive evidence of immunity except when acute infection is suspected.    MUMPS ABS, IGG 274.0 Immune >10.9 AU/mL    Comment:                                 Negative         <9.0                                 Equivocal  9.0 - 10.9                                 Positive        >10.9 A positive result generally indicates past exposure to Mumps virus or previous vaccination.   Hepatitis C Antibody     Status: None   Collection Time: 08/17/22  3:54 PM  Result Value Ref Range   Hep C Virus Ab Non  Reactive Non Reactive    Comment: HCV antibody alone does not differentiate between previously resolved infection and active infection. Equivocal and Reactive HCV antibody results should be followed up with an HCV RNA test to support the diagnosis of active HCV infection.   Hepatitis B surface antigen     Status: None   Collection Time: 08/17/22  3:54 PM  Result Value Ref Range   Hepatitis B Surface Ag Negative Negative  Urinalysis, Routine w reflex microscopic -Urine, Clean Catch     Status: Abnormal   Collection Time: 10/05/22 12:45 AM  Result Value Ref Range   Color, Urine YELLOW YELLOW   APPearance HAZY (A) CLEAR   Specific Gravity, Urine 1.024 1.005 - 1.030   pH 5.0 5.0 - 8.0   Glucose, UA 150 (A) NEGATIVE mg/dL   Hgb urine dipstick NEGATIVE NEGATIVE   Bilirubin Urine NEGATIVE NEGATIVE   Ketones, ur NEGATIVE NEGATIVE mg/dL   Protein, ur NEGATIVE NEGATIVE mg/dL   Nitrite NEGATIVE NEGATIVE   Leukocytes,Ua NEGATIVE NEGATIVE    Comment: Performed at [facility], [address] [place], [place] [address]  CBC with Differential/Platelet     Status: Abnormal   Collection Time: 10/05/22 12:59 AM  Result Value Ref Range   WBC 12.5 (H) 4.0 - 10.5 K/uL   RBC 3.63 (L) 3.87 - 5.11 MIL/uL   Hemoglobin 10.0 (L) 12.0 - 15.0 g/dL   HCT 81.1 (L) 91.4 - 78.2 %   MCV 82.4 80.0 - 100.0 fL   MCH 27.5 26.0 - 34.0 pg   MCHC 33.4 30.0 - 36.0 g/dL   RDW 95.6 21.3 - 08.6 %   Platelets 323 150 - 400 K/uL   nRBC 0.0 0.0 - 0.2 %   Neutrophils Relative % 61 %   Neutro Abs 7.5 1.7 - 7.7 K/uL   Lymphocytes Relative 31 %   Lymphs Abs 3.9 0.7 - 4.0 K/uL   Monocytes Relative 6 %   Monocytes Absolute 0.8 0.1 - 1.0 K/uL   Eosinophils Relative 2 %   Eosinophils Absolute 0.2 0.0 - 0.5  K/uL   Basophils Relative 0 %   Basophils Absolute 0.1 0.0 - 0.1 K/uL   Immature Granulocytes 0 %   Abs Immature Granulocytes 0.04 0.00 - 0.07 K/uL    Comment: Performed at [facility], [address] [place], [place] [address]      Review of Systems  Constitutional:  Negative for fever.  Gastrointestinal:  Positive for abdominal pain.  Genitourinary:  Negative for vaginal bleeding and vaginal discharge.   Physical Exam   Blood pressure 125/77, pulse (!) 102, temperature 98.2 F (36.8 C), resp. rate 18, height 5\' 2"  (1.575 m), weight 79.4 kg, last menstrual period 06/25/2022, SpO2 100 %, currently breastfeeding.  Physical Exam Constitutional:      General: She is not in acute distress.    Appearance: She is well-developed. She is not ill-appearing, toxic-appearing or diaphoretic.  Abdominal:     Tenderness: There is generalized abdominal tenderness. There is no guarding or rebound.  Skin:    General: Skin is warm.  Neurological:     Mental Status: She is alert and oriented to person, place, and time.    MAU Course  Procedures  MDM  + fetal hear tones Tylenol given 1 gram, significant improvement.  [place] is normal.  WBC consistent with WBC from 2/23, no neutrophils.   Assessment and Plan   A:  1. Abdominal pain during pregnancy, antepartum   2. [redacted] weeks gestation of pregnancy      P:  Dc home Strict return precautions Return to MAU if symptoms worsen.  Venia Carbon I, NP 10/12/2022 10:18 AM

## 2022-10-08 ENCOUNTER — Ambulatory Visit: Payer: Medicaid Other

## 2022-10-12 ENCOUNTER — Other Ambulatory Visit (HOSPITAL_COMMUNITY)
Admission: RE | Admit: 2022-10-12 | Discharge: 2022-10-12 | Disposition: A | Discharge status: Home / Self Care | Payer: Medicaid Other | Source: Ambulatory Visit | Attending: Family Medicine | Admitting: Family Medicine

## 2022-10-12 ENCOUNTER — Ambulatory Visit (INDEPENDENT_AMBULATORY_CARE_PROVIDER_SITE_OTHER): Payer: Medicaid Other | Admitting: Family Medicine

## 2022-10-12 VITALS — BP 120/81 | HR 97 | Wt 176.4 lb

## 2022-10-12 DIAGNOSIS — Z113 Encounter for screening for infections with a predominantly sexual mode of transmission: Secondary | ICD-10-CM | POA: Diagnosis present

## 2022-10-12 DIAGNOSIS — Z3490 Encounter for supervision of normal pregnancy, unspecified, unspecified trimester: Secondary | ICD-10-CM

## 2022-10-12 MED ORDER — DOXYLAMINE-PYRIDOXINE 10-10 MG PO TBEC: DELAYED_RELEASE_TABLET | ORAL | 0 refills | Status: DC

## 2022-10-12 MED ORDER — ASPIRIN 81 MG PO TBEC
81.0000 mg | DELAYED_RELEASE_TABLET | Freq: Every day | ORAL | 1 refills | Status: DC
Start: 2022-10-12 — End: 2023-03-21

## 2022-10-12 MED ORDER — ONDANSETRON 4 MG PO TBDP: 4.0000 mg | ORAL_TABLET | Freq: Three times a day (TID) | ORAL | 0 refills | Status: DC | PRN

## 2022-10-12 MED ORDER — PRENATAL VITAMIN PLUS LOW IRON 27-1 MG PO TABS: ORAL_TABLET | ORAL | 1 refills | Status: DC

## 2022-10-12 NOTE — Patient Instructions (Addendum)
It was great to see you!  I sent a prescription for a prenatal vitamin WITH IRON. Take this daily. You should also start taking a baby aspirin ( ) once daily. I sent a prescription to your pharmacy. For your nausea/vomiting: take the diclegis as follows- 2 tablets at bedtime, 1 tablet in the morning, 1 tablet at lunchtime I sent a few tablets of Zofran to use for severe nausea/vomiting. Take 1 tablet before your next appointment so we can do the sugar test.  Take care, Dr Anner Crete  Please make a follow up appointment in 4 weeks.  Prenatal Classes Go to [email or web address] for more information on the pregnancy and child birth classes that [place] has to offer.   Pregnancy Related Return Precautions The follow are signs/symptoms that are abnormal in pregnancy and may require further evaluation by a physician: Go to the MAU at [facility] at [facility] if: You have cramping/contractions that do not go away with drinking water, especially if they are lasting 30 seconds to 1.5 minutes, coming and going every 5-10 minutes for an hour or more, or are getting stronger and you cannot walk or talk while having a contraction/cramp. Your water breaks.  Sometimes it is a big gush of fluid, sometimes it is just a trickle that keeps getting your underwear wet or running down your legs You have vaginal bleeding.    You do not feel your baby moving like normal.  If you do not, get something to eat and drink (something cold or something with sugar like peanut butter or juice) and lay down and focus on feeling your baby move. If your baby is still not moving like normal, you should go to MAU. You should feel your baby move 6 times in one hour, or 10 times in two hours. You have a persistent headache that does not go away with 1 g of Tylenol, vision changes, chest pain, difficulty breathing, severe pain in your right upper abdomen, worsening leg swelling- these  can all be signs of high blood pressure in pregnancy and need to be evaluated by a provider immediately  These are all concerning in pregnancy and if you have any of these I recommend you call your PCP and present to the Maternity Admissions Unit (map below) for further evaluation.  For any pregnancy-related emergencies, please go to the Maternity Admissions Unit in the [facility]'s & Children's Center at [facility]. You will use hospital Entrance C.

## 2022-10-12 NOTE — Progress Notes (Signed)
`   Patient Name: Kelsey Mcdaniel Date of Birth: [birth year] [facility] Prenatal Visit  [name] is a 27 y.o. G3P2002 at [redacted]w[redacted]d here for routine follow up. She is dated by LMP = 14 wk ultrasound.  She reports nausea and vomiting- multiple times daily. She denies vaginal bleeding.  See flow sheet for details.  Seen in MAU on 4/12 for abdominal pain.  Vitals:   10/12/22 1202  BP: 120/81  Pulse: 97     A/P: Pregnancy at [redacted]w[redacted]d.  Doing well.    Routine Prenatal Care:  Dating reviewed, dating tab is correct Fetal heart tones Appropriate Influenza vaccine not administered as not influenza season.   The patient has the following indication for screening preexisting diabetes: BMI >25 and history of infant >4,000 g. Patient could not tolerate today due to vomiting. Will obtain at next visit. Patient to take Zofran beforehand Anatomy ultrasound ordered to be scheduled at 18-20 weeks. Patient is not interested in genetic screening Pregnancy education including expected weight gain in pregnancy, OTC medication use, continued use of prenatal vitamin, smoking cessation if applicable, and nutrition in pregnancy.   Bleeding and pain precautions reviewed. The patient has the following indications for aspirin to begin 81 mg at 12-16 weeks: One high risk condition: no single high risk condition  MORE than one moderate risk condition: obesity and low SES   Aspirin was  recommended today based upon above risk factors (one high risk condition or more than one moderate risk factor)   2. Pregnancy issues include the following and were addressed as appropriate today:  Nausea/Vomiting of pregnancy-increase Diclegis to 4 tablets daily. Also sent few tablets of Zofran to use for severe N/V Anemia- Hgb 10.0 on 10/05/22- Rx sent for PNV with iron.  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 Will need referral to [facility] in 3rd  trimester History of gHTN- in 1st pregnancy. BP wnl today. Monitor. Language barrier: in-person Pashto interpreter, Salvadore Oxford, present for duration of encounter Problem list  and pregnancy box updated: Yes.   Follow up 4 weeks.

## 2022-10-12 NOTE — Addendum Note (Signed)
Addended by: Maury Dus on: 10/12/2022 07:49 PM   Modules accepted: Orders

## 2022-10-15 ENCOUNTER — Encounter: Payer: Self-pay | Admitting: Family Medicine

## 2022-10-15 LAB — CERVICOVAGINAL ANCILLARY ONLY
Chlamydia: NEGATIVE
Comment: NEGATIVE
Comment: NORMAL
Neisseria Gonorrhea: NEGATIVE

## 2022-11-09 ENCOUNTER — Ambulatory Visit (INDEPENDENT_AMBULATORY_CARE_PROVIDER_SITE_OTHER): Payer: Medicaid Other | Admitting: Family Medicine

## 2022-11-09 ENCOUNTER — Other Ambulatory Visit: Payer: Self-pay

## 2022-11-09 VITALS — BP 116/72 | HR 85 | Wt 177.0 lb

## 2022-11-09 DIAGNOSIS — O99810 Abnormal glucose complicating pregnancy: Secondary | ICD-10-CM

## 2022-11-09 DIAGNOSIS — K219 Gastro-esophageal reflux disease without esophagitis: Secondary | ICD-10-CM | POA: Insufficient documentation

## 2022-11-09 DIAGNOSIS — Z3490 Encounter for supervision of normal pregnancy, unspecified, unspecified trimester: Secondary | ICD-10-CM

## 2022-11-09 HISTORY — DX: Gastro-esophageal reflux disease without esophagitis: K21.9

## 2022-11-09 LAB — POCT 1 HR PRENATAL GLUCOSE: Glucose 1 Hr Prenatal, POC: 197 mg/dL

## 2022-11-09 MED ORDER — FAMOTIDINE 10 MG PO TABS: 10.0000 mg | ORAL_TABLET | Freq: Two times a day (BID) | ORAL | 0 refills | Status: DC | PRN

## 2022-11-09 MED ORDER — BLOOD GLUCOSE MONITORING SUPPL DEVI
0 refills | Status: DC
Start: 2022-11-09 — End: 2023-03-21

## 2022-11-09 MED ORDER — BLOOD GLUCOSE TEST VI STRP: ORAL_STRIP | 0 refills | Status: DC

## 2022-11-09 MED ORDER — LANCETS MISC. MISC: 0 refills | Status: DC

## 2022-11-09 MED ORDER — PRENATAL VITAMIN PLUS LOW IRON 27-1 MG PO TABS: ORAL_TABLET | ORAL | 1 refills | Status: DC

## 2022-11-09 NOTE — Patient Instructions (Addendum)
I sent refills on your prenatal vitamin and famotidine.  Continue taking the baby aspirin once daily as well.  Your glucose test was abnormal today. I would like you to check your sugars twice daily at home. Once in the morning (before you've had anything to eat) and once 2 hours after a meal. Keep a written log of the numbers. I sent the supplies to your pharmacy.  I placed a referral to the high risk OB doctors.  Someone will reach out to you by phone to schedule an appointment although this may take several weeks.  Prenatal Classes Go to [email or web address] for more information on the pregnancy and child birth classes that [place] has to offer.   Pregnancy Related Return Precautions The follow are signs/symptoms that are abnormal in pregnancy and may require further evaluation by a physician: Go to the MAU at [facility] at [facility] if: You have cramping/contractions that do not go away with drinking water, especially if they are lasting 30 seconds to 1.5 minutes, coming and going every 5-10 minutes for an hour or more, or are getting stronger and you cannot walk or talk while having a contraction/cramp. Your water breaks.  Sometimes it is a big gush of fluid, sometimes it is just a trickle that keeps getting your underwear wet or running down your legs You have vaginal bleeding.    You do not feel your baby moving like normal.  If you do not, get something to eat and drink (something cold or something with sugar like peanut butter or juice) and lay down and focus on feeling your baby move. If your baby is still not moving like normal, you should go to MAU. You should feel your baby move 6 times in one hour, or 10 times in two hours. You have a persistent headache that does not go away with 1 g of Tylenol, vision changes, chest pain, difficulty breathing, severe pain in your right upper abdomen, worsening leg swelling- these can all be signs  of high blood pressure in pregnancy and need to be evaluated by a provider immediately  These are all concerning in pregnancy and if you have any of these I recommend you call your PCP and present to the Maternity Admissions Unit (map below) for further evaluation.  For any pregnancy-related emergencies, please go to the Maternity Admissions Unit in the Women's & Children's Center at [facility]. You will use hospital Entrance C.

## 2022-11-09 NOTE — Progress Notes (Signed)
  Patient Name: Kelsey Mcdaniel Date of Birth: 02-Jan-1996 [facility] Prenatal Visit  Kelsey Mcdaniel is a 27 y.o. G3P2002 at [redacted]w[redacted]d here for routine follow up. She is dated by LMP = 14 week ultrasound.  She reports no complaints.  She denies vaginal bleeding.  See flow sheet for details.  Vitals:   11/09/22 0846  BP: 116/72  Pulse: 85     A/P: Pregnancy at [redacted]w[redacted]d.   Routine Prenatal Care:  Dating reviewed, dating tab is correct Fetal heart tones Appropriate Influenza vaccine not administered as not influenza season.   The patient has the following indication for screening preexisting diabetes: BMI >25 and history of infant >4,000 g. Anatomy ultrasound scheduled for June 20th Patient is not interested in genetic screening.  Pregnancy education including expected weight gain in pregnancy, OTC medication use, continued use of prenatal vitamin, smoking cessation if applicable, and nutrition in pregnancy.   Bleeding and pain precautions reviewed. The patient has the following indications for aspirin to begin 81 mg at 12-16 weeks: One high risk condition: no single high risk condition  MORE than one moderate risk condition: obesity and low SES   Aspirin was  recommended today based upon above risk factors (one high risk condition or more than one moderate risk factor)-- already taking. Advised to continue  2. Pregnancy issues include the following and were addressed as appropriate today:  Abnormal early 1hr gtt: concerning for pre-existing diabetes. Also has history of infant >4000g and family history of diabetes (dad, sibling). Advised to start monitoring CBG twice daily (one fasting, one postprandial). Referral placed to high risk OB. Follow up appt scheduled with Dr Miquel Dunn 11/20/22. Language barrier: Pashto interpreter present for duration of encounter GERD: using famotidine prn and finds it very helpful, requests refill which was sent today. H/o 4th degree laceration with  rectovaginal fistula and perineoplasty: desires c-section this pregnancy. Referred to high risk OB as above. H/o gHTN in 1st pregnancy-- BP wnl today. Monitor. Problem list  and pregnancy box updated: Yes.   Follow up 11/20/22.

## 2022-11-10 ENCOUNTER — Other Ambulatory Visit: Payer: Self-pay | Admitting: Family Medicine

## 2022-11-20 ENCOUNTER — Ambulatory Visit (INDEPENDENT_AMBULATORY_CARE_PROVIDER_SITE_OTHER): Payer: Medicaid Other | Admitting: Family Medicine

## 2022-11-20 VITALS — BP 121/71 | HR 94 | Wt 179.2 lb

## 2022-11-20 DIAGNOSIS — Z3482 Encounter for supervision of other normal pregnancy, second trimester: Secondary | ICD-10-CM

## 2022-11-20 DIAGNOSIS — O99012 Anemia complicating pregnancy, second trimester: Secondary | ICD-10-CM

## 2022-11-20 DIAGNOSIS — J302 Other seasonal allergic rhinitis: Secondary | ICD-10-CM

## 2022-11-20 DIAGNOSIS — O99810 Abnormal glucose complicating pregnancy: Secondary | ICD-10-CM

## 2022-11-20 DIAGNOSIS — K219 Gastro-esophageal reflux disease without esophagitis: Secondary | ICD-10-CM

## 2022-11-20 MED ORDER — FAMOTIDINE 10 MG PO TABS
10.0000 mg | ORAL_TABLET | Freq: Two times a day (BID) | ORAL | 0 refills | Status: DC | PRN
Start: 2022-11-20 — End: 2023-01-15

## 2022-11-20 MED ORDER — METFORMIN HCL ER 500 MG PO TB24
500.0000 mg | ORAL_TABLET | Freq: Every day | ORAL | 1 refills | Status: DC
Start: 2022-11-20 — End: 2022-12-21

## 2022-11-20 MED ORDER — FLUTICASONE PROPIONATE 50 MCG/ACT NA SUSP
2.0000 | Freq: Every day | NASAL | 6 refills | Status: DC
Start: 2022-11-20 — End: 2023-11-25

## 2022-11-20 NOTE — Patient Instructions (Addendum)
Please make a follow up appointment in 1 weeks - please bring blood sugars to appts. Start taking metformin 500mg  XR once daily in the morning, and keep checking a blood sugar before eating (fasting) and 2 hrs after eating daily.  I think your cough is due to heartburn and allergies.We added Flonase and pepcid to use daily. If you have fevers, chills, start coughing up sputum or blood- please go to the emergency room.   We ordered an [place] for a pediatric heart ultrasound, they will call you for an appointment.  Prenatal Classes Go to [email or web address] for more information on the pregnancy and child birth classes that [place] has to offer.   Pregnancy Related Return Precautions The follow are signs/symptoms that are abnormal in pregnancy and may require further evaluation by a physician: Go to the MAU at [facility] at [facility] if: You have cramping/contractions that do not go away with drinking water, especially if they are lasting 30 seconds to 1.5 minutes, coming and going every 5-10 minutes for an hour or more, or are getting stronger and you cannot walk or talk while having a contraction/cramp. Your water breaks.  Sometimes it is a big gush of fluid, sometimes it is just a trickle that keeps getting your underwear wet or running down your legs You have vaginal bleeding.    You do not feel your baby moving like normal.  If you do not, get something to eat and drink (something cold or something with sugar like peanut butter or juice) and lay down and focus on feeling your baby move. If your baby is still not moving like normal, you should go to MAU. You should feel your baby move 6 times in one hour, or 10 times in two hours. You have a persistent headache that does not go away with 1 g of Tylenol, vision changes, chest pain, difficulty breathing, severe pain in your right upper abdomen, worsening leg swelling- these can all be signs of high  blood pressure in pregnancy and need to be evaluated by a provider immediately  These are all concerning in pregnancy and if you have any of these I recommend you call your PCP and present to the Maternity Admissions Unit (map below) for further evaluation.  For any pregnancy-related emergencies, please go to the Maternity Admissions Unit in the Women's & Children's Center at [facility]. You will use hospital Entrance C.    Our clinic number is [phone].   Dr Miquel Dunn   Safe Medications in Pregnancy   Acne:  Benzoyl Peroxide  Salicylic Acid   Backache/Headache:  Tylenol: 2 regular strength every 4 hours OR               2 Extra strength every 6 hours   Colds/Coughs/Allergies:  Benadryl (alcohol free) 25 mg every 6 hours as needed  Breath right strips  Claritin  Cepacol throat lozenges  Chloraseptic throat spray  Cold-Eeze- up to three times per day  Cough drops, alcohol free  Flonase (by prescription only)  Guaifenesin  Mucinex  Robitussin DM (plain only, alcohol free)  Saline nasal spray/drops  Sudafed (pseudoephedrine) & Actifed * use only after [redacted] weeks gestation and if you do not have high blood pressure  Tylenol  Vicks Vaporub  Zinc lozenges  Zyrtec   Constipation:  Colace  Ducolax suppositories  Fleet enema  Glycerin suppositories  Metamucil  Milk of magnesia  Miralax  Senokot  Smooth move tea  Diarrhea:  Kaopectate  Imodium A-D   *NO pepto Bismol   Hemorrhoids:  Anusol  Anusol HC  Preparation H  Tucks   Indigestion:  Tums  Maalox  Mylanta  Zantac  Pepcid   Insomnia:  Benadryl (alcohol free) 25mg  every 6 hours as needed  Tylenol PM  Unisom, no Gelcaps   Leg Cramps:  Tums  MagGel   Nausea/Vomiting:  Bonine  Dramamine  Emetrol  Ginger extract  Sea bands  Meclizine  Nausea medication to take during pregnancy:  Unisom (doxylamine succinate 25 mg tablets) Take one tablet daily at bedtime. If symptoms are not  adequately controlled, the dose can be increased to a maximum recommended dose of two tablets daily (1/2 tablet in the morning, 1/2 tablet mid-afternoon and one at bedtime).  Vitamin B6 100mg  tablets. Take one tablet twice a day (up to 200 mg per day).   Skin Rashes:  Aveeno products  Benadryl cream or 25mg  every 6 hours as needed  Calamine Lotion  1% cortisone cream   Yeast infection:  Gyne-lotrimin 7  Monistat 7    **If taking multiple medications, please check labels to avoid duplicating the same active ingredients  **take medication as directed on the label  ** Do not exceed 4000 mg of tylenol in 24 hours  **Do not take medications that contain aspirin or ibuprofen

## 2022-11-20 NOTE — Progress Notes (Addendum)
[facility] OB Clinic Visit  Kelsey Mcdaniel is a 27 y.o. G3P2002 at [redacted]w[redacted]d (via LMP c/w 14wk sono) who presents for OB follow up. Prenatal course, history, notes, ultrasounds, and laboratory results reviewed.  Denies cramping/ctx, fluid leaking, vaginal bleeding, or decreased fetal movement. Taking PNV, baby aspirin. Taking OTC  guifenesim and Cepacol drops for dry cough x 15 days. Denies fevers, chills, sputum production, hemoptysis, shortness of breath. Does note some side pain from coughing.  Primary Prenatal Care Provider: Dr Anner Crete -> transfer to high risk OB  Postpartum Plans: - delivery planning: LTCS - pediatrician: [facility]  FHR: 150 bpm Uterine size: at umbilicus  Vitals:   11/20/22 0949  BP: 121/71  Pulse: 94   General: A&O, NAD HEENT: No sign of trauma, EOM grossly intact Cardiac: RRR, no m/r/g Respiratory: CTAB, normal WOB, no w/c/r GI: Soft, gravid, no guarding or rebound, fundus at umbilicus Extremities: NTTP, no peripheral edema. Neuro: Normal gait, moves all four extremities appropriately. Psych: Appropriate mood and affect  Assessment & Plan  1. Routine prenatal care: - anatomy US scheduled 12/13/22  2. Concern for pre-existing diabetes- failed early 1 hr GTT 197. Has been checking BG- fasting values 93-100, postprandial has 106, 111, 134. As > 50% BG greater than goals of fasting glucose <90 and 2 hr PP < 120, will start metformin 500mg  daily, and f/u in 1 week check BG. Has anatomy/growth [place] scheduled 12/13/22. Referral for fetal ECHO placed today, discussed with patient to expect call.   3. Cough x2 weeks- non-productive, without fever or other signs/symptoms of pneumonia. Normal exam. Suspect component of allergies vs GERD- start Pepcid and flonase, with shared decision making discussed CXR but would like to hold at this time. No red flag signs/symptoms, discussed if these occurred would need to call clinic/go to ED.   3. History of  gestational HTN - continue baby aspirin daily. Normotensive today.  4. History of fourth degree tear with rectovaginal fistula- referral to OB as above, desires PLTCS as she required perineoplasty x2 due to flatus incontinence after last delivery.  5. Nausea vomiting in pregnancy- notes limited appetite but has been keeping down fluids/food. 2lb weight gain since last visit. Continue Diclegis.   6. Anemia in pregnancy - Hgb 10.0 at last appt. Taking PNV with iron. Will repeat CBC today to see if further supplementation needed.   Next prenatal visit in 1 weeks - monitor BG, goal FGP <90 and 2 hr PP < 120, if > 50% elevated increase dose of metformin and continue to follow weekly until able to get into HROB. F/u on fetal ECHO and anatomy US if can be moved earlier. Labor & fetal movement precautions discussed.  Burley Saver, MD [facility]

## 2022-11-21 LAB — CBC
Hematocrit: 33.8 % — ABNORMAL LOW (ref 34.0–46.6)
Hemoglobin: 10.7 g/dL — ABNORMAL LOW (ref 11.1–15.9)
MCH: 27 pg (ref 26.6–33.0)
MCHC: 31.7 g/dL (ref 31.5–35.7)
MCV: 85 fL (ref 79–97)
Platelets: 283 10*3/uL (ref 150–450)
RBC: 3.96 x10E6/uL (ref 3.77–5.28)
RDW: 14.9 % (ref 11.7–15.4)
WBC: 11.2 10*3/uL — ABNORMAL HIGH (ref 3.4–10.8)

## 2022-11-26 ENCOUNTER — Inpatient Hospital Stay (HOSPITAL_COMMUNITY): Payer: Medicaid Other

## 2022-11-26 ENCOUNTER — Other Ambulatory Visit: Payer: Self-pay

## 2022-11-26 ENCOUNTER — Telehealth: Payer: Self-pay

## 2022-11-26 ENCOUNTER — Inpatient Hospital Stay (HOSPITAL_COMMUNITY)
Admission: AD | Admit: 2022-11-26 | Discharge: 2022-11-26 | Disposition: A | Discharge status: Home / Self Care | Payer: Medicaid Other | Attending: Family Medicine | Admitting: Family Medicine

## 2022-11-26 ENCOUNTER — Encounter (HOSPITAL_COMMUNITY): Payer: Self-pay | Admitting: Family Medicine

## 2022-11-26 ENCOUNTER — Ambulatory Visit (INDEPENDENT_AMBULATORY_CARE_PROVIDER_SITE_OTHER): Payer: Medicaid Other | Admitting: Family Medicine

## 2022-11-26 VITALS — BP 110/67 | HR 82 | Wt 177.8 lb

## 2022-11-26 DIAGNOSIS — R1030 Lower abdominal pain, unspecified: Secondary | ICD-10-CM | POA: Diagnosis not present

## 2022-11-26 DIAGNOSIS — R109 Unspecified abdominal pain: Secondary | ICD-10-CM

## 2022-11-26 DIAGNOSIS — Z3A22 22 weeks gestation of pregnancy: Secondary | ICD-10-CM | POA: Diagnosis not present

## 2022-11-26 DIAGNOSIS — D72829 Elevated white blood cell count, unspecified: Secondary | ICD-10-CM | POA: Diagnosis not present

## 2022-11-26 DIAGNOSIS — O26892 Other specified pregnancy related conditions, second trimester: Secondary | ICD-10-CM | POA: Diagnosis present

## 2022-11-26 DIAGNOSIS — O99112 Other diseases of the blood and blood-forming organs and certain disorders involving the immune mechanism complicating pregnancy, second trimester: Secondary | ICD-10-CM | POA: Insufficient documentation

## 2022-11-26 DIAGNOSIS — Z3482 Encounter for supervision of other normal pregnancy, second trimester: Secondary | ICD-10-CM

## 2022-11-26 DIAGNOSIS — O26899 Other specified pregnancy related conditions, unspecified trimester: Secondary | ICD-10-CM

## 2022-11-26 LAB — URINALYSIS, ROUTINE W REFLEX MICROSCOPIC
Bilirubin Urine: NEGATIVE
Glucose, UA: NEGATIVE mg/dL
Hgb urine dipstick: NEGATIVE
Ketones, ur: NEGATIVE mg/dL
Leukocytes,Ua: NEGATIVE
Nitrite: NEGATIVE
Protein, ur: NEGATIVE mg/dL
Specific Gravity, Urine: 1.027 (ref 1.005–1.030)
pH: 5 (ref 5.0–8.0)

## 2022-11-26 LAB — DIFFERENTIAL
Abs Immature Granulocytes: 0.05 10*3/uL (ref 0.00–0.07)
Basophils Absolute: 0.1 10*3/uL (ref 0.0–0.1)
Basophils Relative: 0 %
Eosinophils Absolute: 0.3 10*3/uL (ref 0.0–0.5)
Eosinophils Relative: 2 %
Immature Granulocytes: 0 %
Lymphocytes Relative: 19 %
Lymphs Abs: 3 10*3/uL (ref 0.7–4.0)
Monocytes Absolute: 0.8 10*3/uL (ref 0.1–1.0)
Monocytes Relative: 5 %
Neutro Abs: 11.5 10*3/uL — ABNORMAL HIGH (ref 1.7–7.7)
Neutrophils Relative %: 74 %

## 2022-11-26 LAB — WET PREP, GENITAL
Clue Cells Wet Prep HPF POC: NONE SEEN
Sperm: NONE SEEN
Trich, Wet Prep: NONE SEEN
WBC, Wet Prep HPF POC: >=10 — AB (ref <10)
Yeast Wet Prep HPF POC: NONE SEEN

## 2022-11-26 LAB — CBC
HCT: 32.7 % — ABNORMAL LOW (ref 36.0–46.0)
Hemoglobin: 10.7 g/dL — ABNORMAL LOW (ref 12.0–15.0)
MCH: 27.8 pg (ref 26.0–34.0)
MCHC: 32.7 g/dL (ref 30.0–36.0)
MCV: 84.9 fL (ref 80.0–100.0)
Platelets: 303 10*3/uL (ref 150–400)
RBC: 3.85 MIL/uL — ABNORMAL LOW (ref 3.87–5.11)
RDW: 15.9 % — ABNORMAL HIGH (ref 11.5–15.5)
WBC: 15.8 10*3/uL — ABNORMAL HIGH (ref 4.0–10.5)
nRBC: 0 % (ref 0.0–0.2)

## 2022-11-26 MED ORDER — CYCLOBENZAPRINE HCL 10 MG PO TABS: 10.0000 mg | ORAL_TABLET | Freq: Two times a day (BID) | ORAL | 0 refills | Status: DC | PRN

## 2022-11-26 MED ORDER — IBUPROFEN 600 MG PO TABS
600.0000 mg | ORAL_TABLET | Freq: Four times a day (QID) | ORAL | Status: DC | PRN
  Administered 2022-11-26: 600 mg via ORAL
  Filled 2022-11-26: qty 1

## 2022-11-26 MED ORDER — ACETAMINOPHEN 325 MG PO TABS: 650.0000 mg | ORAL_TABLET | ORAL | 0 refills | Status: DC | PRN

## 2022-11-26 MED ORDER — LORAZEPAM 1 MG PO TABS
1.0000 mg | ORAL_TABLET | Freq: Once | ORAL | Status: AC
  Administered 2022-11-26: 1 mg via ORAL
  Filled 2022-11-26: qty 1

## 2022-11-26 NOTE — MAU Note (Signed)
Kelsey Mcdaniel is a 27 y.o. at [redacted]w[redacted]d here in MAU reporting: she having constant abdominal pain in entire abdomen but more pain is located on the left side.  Reports pain began this morning @ 1030 and feel like a pulling sensation. Denies VB or LOF.  Endorses +FM. LMP: NA Onset of complaint: today Pain score: 7 Vitals:   11/26/22 1558  BP: 115/64  Pulse: 81  Resp: 18  Temp: 98.1 F (36.7 C)  SpO2: 98%     [id number] d/t maternal apparel Lab orders placed from triage:   UA

## 2022-11-26 NOTE — Telephone Encounter (Signed)
Received call from husband regarding patient. In order to gather more information, Pashto interpreter was called. Connected with patient's husband with interpreter.   [id number]- Pashto interpreter.   Husband reports that she is having belly pain. Patient is [redacted] weeks pregnant.   This started 2 hours ago. Describes pain as sharp, starting on right side and now more so on the left side. Reports that pain is lower abdominal around naval. Husband states that patient is uncomfortable but pain is not severe.   Blood sugar levels: 93 fasting CBG.    Denies vaginal bleeding. Reports positive fetal movement. No burning with urination, however, does report pain when urinating.   Spoke with Dr. Lum Babe regarding patient. Advised that we could schedule patient same day appointment based on symptoms.   Provided with strict precautions for MAU. Husband verbalizes understanding.   Veronda Prude, RN

## 2022-11-26 NOTE — Progress Notes (Signed)
  [facility] Prenatal Visit  Kelsey Mcdaniel is a 27 y.o. G3P2002 at [redacted]w[redacted]d here for abdominal pain.   She is dated by LMP c/w 14 wk ultrasound.   She reports good fetal movement.  No bleeding, loss of fluid, contractions.  Abdominal Pain-- for 2 weeks has had some discomfort in right lower abdomen, especially from coughing a lot. Last night her abdominal pain got significantly worse/different, now on left side as well. Last night unable to sleep well due to pain. Described as sharp. Worse with any type of movement. 8/10, no improvement with Tylenol. No nausea, no vomiting, normal BM, no fever or chills, urinating normally.   Vitals:   11/26/22 1450  BP: 110/67  Pulse: 82   Physical Exam: Uncomfortable appearing Abdomen: tender in RLQ, LLQ FHT: 144bpm  A/P: Pregnancy at [redacted]w[redacted]d.   Dating reviewed, dating tab is correct Fetal heart tones Appropriate Fundal height within expected range.  Anatomy ultrasound: scheduled for 12/13/22 The patient has the following indications for aspirinto begin 81 mg at 12-16 weeks: History of gHTN. Continue baby aspirin daily. Preterm labor precautions given.   2. Pregnancy issues that were addressed today:   Abdominal Pain- patient with significant abdominal pain despite Tylenol. Appears uncomfortable on exam with RLQ and LLQ tenderness to palpation. Differential includes round ligament pain vs appendicitis vs constipation vs less likely nephrolithiasis or placental abruption. Recommended patient proceed to MAU for further evaluation.  Has routine prenatal visit tomorrow: will need to address the following- monitor BG, goal FGP <90 and 2 hr PP < 120, if > 50% elevated increase dose of metformin and continue to follow weekly until able to get into HROB. F/u on fetal ECHO and anatomy US if can be moved earlier.

## 2022-11-26 NOTE — MAU Note (Signed)
RN called MRI to let them know the pt wants to try MRI again. RN let MRI know that the pt is now medicated and needs to get in MRI as soon as possible. MRI stated they are busy right now but will get pt in as soon as possible. RN stated that pt requested female tech. MRI stated they don't have any female techs at this time. RN discussed this with pt via interpreter and pt understands and is now okay with female tech.

## 2022-11-26 NOTE — Progress Notes (Signed)
Pt unable to tolerate MRI. Squeezed call ball immediately and began to panic. She refused to continue and does not want to try with medication.

## 2022-11-26 NOTE — Patient Instructions (Signed)
Please go to the Maternity Assessment Unit at the hospital. Use Entrance C    Our clinic number is [phone].

## 2022-11-26 NOTE — MAU Provider Note (Addendum)
History     CSN: [id number]  Arrival date and time: 11/26/22 1526   Event Date/Time   First Provider Initiated Contact with Patient 11/26/22 1630     27 y.o. [id number] @22 .0 wks presenting with LAP. Pain started yesterday. Reports pain in LLQ and RLQ. Pain is constant. Pain is worse with movement. Rates pain 7/10. She took Tylenol but it didn't help. Denies fever, diarrhea, or N/V. Denies urinary sx. Denies ctx, VB, or LOF. Had BM today.     OB History     Gravida  3   Para  2   Term  2   Preterm  0   AB  0   Living  2      SAB      IAB      Ectopic      Multiple  0   Live Births  2           Past Medical History:  Diagnosis Date   Cloacal malformation    History of pregnancy induced hypertension     Past Surgical History:  Procedure Laterality Date   PERINEOPLASTY N/A 04/10/2021   Procedure: PERINEOPLASTY;  Surgeon: Marguerita Beards, MD;  Location: [facility];  Service: Gynecology;  Laterality: N/A;   PERINEOPLASTY N/A 04/27/2021   Procedure: PERINEOPLASTY;  Surgeon: Marguerita Beards, MD;  Location: [facility];  Service: Gynecology;  Laterality: N/A;  total time requested is 1 hour   RECTOVAGINAL FISTULA CLOSURE  2021    Family History  Problem Relation Age of Onset   Heart disease Father     Social History   Tobacco Use   Smoking status: Never   Smokeless tobacco: Never  Vaping Use   Vaping Use: Never used  Substance Use Topics   Alcohol use: Never   Drug use: Never    Allergies: No Known Allergies  Medications Prior to Admission  Medication Sig Dispense Refill Last Dose   metFORMIN (GLUCOPHAGE-XR) 500 MG 24 hr tablet Take 1 tablet (500 mg total) by mouth daily with breakfast. 60 tablet 1 11/25/2022   ACCU-CHEK GUIDE test strip TEST FOUR TIMES DAILY 100 strip 0    Accu-Chek Softclix Lancets lancets TEST FOUR TIMES DAILY 100 each 5    aspirin EC 81 MG tablet Take 1 tablet (81 mg total) by mouth  daily. Swallow whole. 90 tablet 1    Blood Glucose Monitoring Suppl DEVI Use to check blood sugar up to 4 times daily. May substitute to any manufacturer covered by patient's insurance. 1 each 0    Doxylamine-Pyridoxine (DICLEGIS) 10-10 MG TBEC Take 2 tablets at bedtime, 1 tablet in the morning and 1 tablet at lunch time 120 tablet 0    famotidine (PEPCID) 10 MG tablet Take 1 tablet (10 mg total) by mouth 2 (two) times daily as needed for heartburn or indigestion. 60 tablet 0    fluticasone (FLONASE) 50 MCG/ACT nasal spray Place 2 sprays into both nostrils daily. 16 g 6    Lancets Misc. MISC Use to check blood sugar up to 4 times daily. May substitute to any manufacturer covered by patient's insurance. 100 each 0    lidocaine (XYLOCAINE) 5 % ointment Apply 1 Application topically as needed. 35.44 g 0    polyethylene glycol powder (GLYCOLAX/MIRALAX) 17 GM/SCOOP powder Take 17 g by mouth in the morning, at noon, and at bedtime. 850 g 0    Prenatal Vit-Fe Fumarate-FA (PRENATAL VITAMIN PLUS LOW IRON) 27-1  MG TABS Take 1 tablet by mouth daily 90 tablet 1     Review of Systems  Constitutional:  Negative for fever.  Gastrointestinal:  Positive for abdominal pain. Negative for constipation, diarrhea, nausea and vomiting.  Genitourinary:  Negative for dysuria, vaginal bleeding and vaginal discharge.   Physical Exam   Blood pressure 115/64, pulse 81, temperature 98.1 F (36.7 C), temperature source Oral, resp. rate 18, weight 79.8 kg, last menstrual period 06/25/2022, SpO2 98 %, currently breastfeeding.  Physical Exam Vitals and nursing note reviewed. Exam conducted with a chaperone present.  Constitutional:      General: She is not in acute distress.    Appearance: Normal appearance.  HENT:     Head: Normocephalic and atraumatic.  Cardiovascular:     Rate and Rhythm: Normal rate.  Pulmonary:     Effort: Pulmonary effort is normal. No respiratory distress.  Abdominal:     Palpations: Abdomen  is soft.     Tenderness: There is abdominal tenderness in the right lower quadrant, suprapubic area and left lower quadrant.  Genitourinary:    Comments: VE: closed/thick Musculoskeletal:        General: Normal range of motion.     Cervical back: Normal range of motion.  Skin:    General: Skin is warm and dry.  Neurological:     General: No focal deficit present.     Mental Status: She is alert and oriented to person, place, and time.  Psychiatric:        Mood and Affect: Mood normal.        Behavior: Behavior normal.   FHT 147  Results for orders placed or performed during the hospital encounter of 11/26/22 (from the past 24 hour(s))  Urinalysis, Routine w reflex microscopic -Urine, Clean Catch     Status: Abnormal   Collection Time: 11/26/22  4:17 PM  Result Value Ref Range   Color, Urine AMBER (A) YELLOW   APPearance HAZY (A) CLEAR   Specific Gravity, Urine 1.027 1.005 - 1.030   pH 5.0 5.0 - 8.0   Glucose, UA NEGATIVE NEGATIVE mg/dL   Hgb urine dipstick NEGATIVE NEGATIVE   Bilirubin Urine NEGATIVE NEGATIVE   Ketones, ur NEGATIVE NEGATIVE mg/dL   Protein, ur NEGATIVE NEGATIVE mg/dL   Nitrite NEGATIVE NEGATIVE   Leukocytes,Ua NEGATIVE NEGATIVE   MAU Course  Procedures Ibuprofen  MDM Labs ordered and reviewed. Pain improved. Elevated WBCs. Recommend MRI. RN reports pt could not tolerate being in MRI, was claustrophobic. R/B discussed with pt by Dr. Ladon Applebaum and myself; we cannot r/o a serious condition such as appendicitis or other acute abdominal process. Pt declines attempt with premedication and plans to leave AMA. Pt changed her mind and decided she would try premedication and another attempt to complete MRI. Transfer of care given to [facility], CNM 11/26/22, 2045  -Patient sleeping in left lateral when Whitney, CNM entered room. Imaging results reviwed, all normal. No additional evaluation in MAU today.  MR PELVIS WO CONTRAST  Result Date:  11/26/2022 CLINICAL DATA:  Suspect appendicitis EXAM: MRI PELVIS WITHOUT CONTRAST TECHNIQUE: Multiplanar multisequence MR imaging of the pelvis was performed. No intravenous contrast was administered. COMPARISON:  None Available. FINDINGS: Lower chest: No acute findings. Hepatobiliary: No mass or other parenchymal abnormality identified. Pancreas: No mass, inflammatory changes, or other parenchymal abnormality identified. Spleen:  Within normal limits in size and appearance. Adrenals/Urinary Tract: No masses identified. No evidence of hydronephrosis. Stomach/Bowel: The appendix is visualized and appears  normal on axial images 11/7 and 8 and coronal images 7/15 and 16. No dilated bowel loops are seen. Stomach is within normal limits. Vascular/Lymphatic: No pathologically enlarged lymph nodes identified. No abdominal aortic aneurysm demonstrated. Other: There is trace free fluid in the right lower quadrant. Single intrauterine gestation is identified. The ovaries are unremarkable. Musculoskeletal: No suspicious bone lesions identified. IMPRESSION: 1. The appendix is visualized and appears normal. 2. Trace free fluid in the right lower quadrant. 3. Single intrauterine gestation. Electronically Signed   By: Darliss Cheney M.D.   On: 11/26/2022 22:41   MR ABDOMEN WO CONTRAST  Result Date: 11/26/2022 CLINICAL DATA:  Acute abdominal pain. Images 77 and 8 MR series axial images EXAM: MRI ABDOMEN WITHOUT CONTRAST TECHNIQUE: Multiplanar multisequence MR imaging was performed without the administration of intravenous contrast. COMPARISON:  None Available. FINDINGS: Lower chest: No acute findings. Hepatobiliary: No mass or other parenchymal abnormality identified. Pancreas: No mass, inflammatory changes, or other parenchymal abnormality identified. Spleen:  Within normal limits in size and appearance. Adrenals/Urinary Tract: No masses identified. No evidence of hydronephrosis. Stomach/Bowel: The appendix is visualized and  appears normal on axial images 11/7 and 8 and coronal images 7/15 and 16. No dilated bowel loops are seen. Stomach is within normal limits. Vascular/Lymphatic: No pathologically enlarged lymph nodes identified. No abdominal aortic aneurysm demonstrated. Other: There is trace free fluid in the right lower quadrant. Single intrauterine gestation is identified. The ovaries are unremarkable. Musculoskeletal: No suspicious bone lesions identified. IMPRESSION: 1. The appendix is visualized and appears normal. 2. Trace free fluid in the right lower quadrant. 3. Single intrauterine gestation. Electronically Signed   By: Darliss Cheney M.D.   On: 11/26/2022 22:37    Assessment and Plan  --27 y.o. G3P2002 at [redacted]w[redacted]d  --Leukocytosis without associated acute symptoms --Normal CT, no evidence of appendicitis --Pashto interpreter utilized for all patient interaction --Discharge home in stable condition  Clayton Bibles, MSA, MSN, CNM Certified Nurse Midwife, Biochemist, clinical for [employer], [facility]

## 2022-11-27 ENCOUNTER — Encounter: Payer: Self-pay | Admitting: Family Medicine

## 2022-11-27 LAB — GC/CHLAMYDIA PROBE AMP (~~LOC~~) NOT AT ARMC
Chlamydia: NEGATIVE
Comment: NEGATIVE
Comment: NORMAL
Neisseria Gonorrhea: NEGATIVE

## 2022-12-12 NOTE — Progress Notes (Signed)
Office Visit Note  Patient: Kelsey Mcdaniel             Date of Birth: October 03, 1995           MRN: [id number]             PCP: Billey Co, MD Referring: Billey Co, MD Visit Date: 12/25/2022 Occupation: @GUAROCC @  Interpreter: Sherian Maroon  Subjective:  Pain in multiple joints  History of Present Illness: Kelsey Mcdaniel is a 27 y.o. female seen in consultation per request of her PCP for the evaluation of elevated sedimentation rate.  According the patient she moved to to the [place] from [place] in 2021.  She delivered her baby in April 2022.  She states even prior to her delivery she was having some joint pain and stiffness.  She had increased joint pain and swelling after the baby was born.  She states she continues to have discomfort in her joints which she describes in her neck, lower back, shoulders, elbows, wrists, hands, knees, ankles and her feet.  She also has discomfort in her muscles.  She states she notices puffiness in her hands and her legs.  She also notices bluish spots in different parts of her body.  She denies history of Raynauds.  There is no history of oral ulcers, nasal ulcers, malar rash, Raynaud's, photosensitivity, lymphadenopathy.  She gives history of dry mouth.  She is gravida 3, para 2.  She had gestational diabetes with her previous pregnancy.  There is no family history of autoimmune disease.  She states her mother has arthritis.    Activities of Daily Living:  Patient reports morning stiffness for 0 minutes.   Patient Reports nocturnal pain.  Difficulty dressing/grooming: Denies Difficulty climbing stairs: Denies Difficulty getting out of chair: Denies Difficulty using hands for taps, buttons, cutlery, and/or writing: Reports  Review of Systems  Constitutional:  Positive for fatigue.  HENT:  Positive for mouth dryness. Negative for mouth sores.   Eyes:  Negative for dryness.  Respiratory:  Negative for shortness of breath.   Cardiovascular:   Negative for chest pain and palpitations.  Gastrointestinal:  Negative for blood in stool, constipation and diarrhea.  Endocrine: Negative for increased urination.  Genitourinary:  Negative for involuntary urination.  Musculoskeletal:  Positive for joint pain, gait problem, joint pain and muscle weakness. Negative for joint swelling, myalgias, morning stiffness, muscle tenderness and myalgias.  Skin:  Positive for hair loss. Negative for color change, rash and sensitivity to sunlight.  Allergic/Immunologic: Negative for susceptible to infections.  Neurological:  Positive for numbness. Negative for dizziness and headaches.  Hematological:  Negative for swollen glands.  Psychiatric/Behavioral:  Negative for depressed mood and sleep disturbance. The patient is not nervous/anxious.     PMFS History:  Patient Active Problem List   Diagnosis Date Noted   Abnormal glucose tolerance affecting pregnancy, antepartum 11/09/2022   GERD (gastroesophageal reflux disease) 11/09/2022   Pregnancy 08/17/2022   Constipation 01/13/2021   History of gestational hypertension 06/30/2020    Past Medical History:  Diagnosis Date   Cloacal malformation    Gestational diabetes    History of pregnancy induced hypertension     Family History  Problem Relation Age of Onset   Diabetes Mother    Heart disease Mother    Diabetes Father    Heart disease Father    Healthy Sister    Healthy Sister    Healthy Sister    Healthy Sister    Healthy  Brother    Healthy Brother    Healthy Brother    Healthy Daughter    Healthy Daughter    Past Surgical History:  Procedure Laterality Date   PERINEOPLASTY N/A 04/10/2021   Procedure: PERINEOPLASTY;  Surgeon: Marguerita Beards, MD;  Location: [facility];  Service: Gynecology;  Laterality: N/A;   PERINEOPLASTY N/A 04/27/2021   Procedure: PERINEOPLASTY;  Surgeon: Marguerita Beards, MD;  Location: [facility];  Service:  Gynecology;  Laterality: N/A;  total time requested is 1 hour   RECTOVAGINAL FISTULA CLOSURE  2021   Social History   Social History Narrative   Not on file   Immunization History  Administered Date(s) Administered   Hepatitis A, Adult 07/31/2021   Hepatitis B, ADULT 07/31/2021   Influenza,inj,Quad PF,6+ Mos 07/31/2021   Influenza-Unspecified 04/07/2020   Janssen (J&J) SARS-COV-2 Vaccination 03/18/2020   MMR 07/31/2021   Moderna SARS-COV2 Booster Vaccination 06/29/2020   Tdap 07/22/2020     Objective: Vital Signs: BP 126/77 (BP Location: Right Arm, Patient Position: Sitting, Cuff Size: Normal)   Pulse 96   Resp 15   Ht 5' 4.5" (1.638 m)   Wt 178 lb 6.4 oz (80.9 kg)   LMP 06/25/2022 (Exact Date)   BMI 30.15 kg/m    Physical Exam Vitals and nursing note reviewed.  Constitutional:      Appearance: She is well-developed.  HENT:     Head: Normocephalic and atraumatic.  Eyes:     Conjunctiva/sclera: Conjunctivae normal.  Cardiovascular:     Rate and Rhythm: Normal rate and regular rhythm.     Heart sounds: Normal heart sounds.  Pulmonary:     Effort: Pulmonary effort is normal.     Breath sounds: Normal breath sounds.  Abdominal:     General: Bowel sounds are normal.     Palpations: Abdomen is soft.  Musculoskeletal:     Cervical back: Normal range of motion.  Lymphadenopathy:     Cervical: No cervical adenopathy.  Skin:    General: Skin is warm and dry.     Capillary Refill: Capillary refill takes less than 2 seconds.  Neurological:     Mental Status: She is alert and oriented to person, place, and time.  Psychiatric:        Behavior: Behavior normal.      Musculoskeletal Exam: Cervical, thoracic, lumbar spine were in good range of motion.  She had no SI joint tenderness.  She had tenderness over bilateral gluteal region and trapezius region.  Shoulder joints, elbow joints, wrist joints, MCPs PIPs and DIPs been good range of motion with no synovitis.  Hip  joints, knee joints, ankles, MTPs and PIPs were in good range of motion with no synovitis.  CDAI Exam: CDAI Score: -- Patient Global: --; Provider Global: -- Swollen: --; Tender: -- Joint Exam 12/25/2022   No joint exam has been documented for this visit   There is currently no information documented on the homunculus. Go to the Rheumatology activity and complete the homunculus joint exam.  Investigation: No additional findings.  Imaging: [place] MFM OB DETAIL +14 WK  Result Date: 12/13/2022 ----------------------------------------------------------------------  OBSTETRICS REPORT                       (Signed Final 12/13/2022 04:09 pm) ---------------------------------------------------------------------- Patient Info  ID #:       [id number]  D.O.B.:  05-25-1996 (27 yrs)  Name:       [facility]                    Visit Date: 12/13/2022 02:16 pm ---------------------------------------------------------------------- Performed By  Attending:        Braxton Feathers DO       Referred By:      [facility] MAU/Triage  Performed By:     Reinaldo Raddle            [facility] ---------------------------------------------------------------------- Orders  #  Description                           Code        Ordered By  1  [place] MFM OB DETAIL +14 WK               76811.01    MARSHALL                                                       CHAMBLISS ----------------------------------------------------------------------  #  Order #                     Accession #                Episode #  1  [id number]                   [id number]                 [id number] ---------------------------------------------------------------------- Indications  Poor obstetric history: Previous                O09.299  preeclampsia / eclampsia/gestational HTN  Anemia during pregnancy in second trimester    O99.012  Gestational diabetes in pregnancy, diet        O24.410  controlled  Pyelectasis of fetus on prenatal ultrasound    O28.3  Encounter for antenatal screening for          Z36.3  malformations  [redacted] weeks gestation of pregnancy                Z3A.24  Declined testing ---------------------------------------------------------------------- Fetal Evaluation  Num Of Fetuses:         1  Fetal Heart Rate(bpm):  147  Cardiac Activity:       Observed  Presentation:           Cephalic  Placenta:               Anterior  P. Cord Insertion:      Visualized, central  Amniotic Fluid  AFI FV:      Within normal limits                              Largest Pocket(cm)                              6.02 ---------------------------------------------------------------------- Biometry  BPD:      64.3  mm     G. Age:  26w 0d         90  %    CI:        78.41   %    70 - 86                                                          FL/HC:      19.7   %    18.7 - 20.9  HC:      229.7  mm     G. Age:  25w 0d         52  %    HC/AC:      1.13        1.05 - 1.21  AC:      203.9  mm     G. Age:  25w 0d         59  %    FL/BPD:     70.5   %    71 - 87  FL:       45.3  mm     G. Age:  25w 0d         55  %    FL/AC:      22.2   %    20 - 24  HUM:      40.6  mm     G. Age:  24w 5d         47  %  CER:      27.5  mm     G. Age:  24w 4d         61  %  LV:        4.7  mm  CM:        7.8  mm  Est. FW:     763  gm    1 lb 11 oz      69  % ---------------------------------------------------------------------- OB History  Blood Type:   A+  Gravidity:    3         Term:   2        Prem:   0        SAB:   0  TOP:          0  Ectopic:  0        Living: 2 ---------------------------------------------------------------------- Gestational Age  LMP:           24w 3d        Date:  06/25/22                   EDD:   04/01/23  U/S Today:     25w 2d                                         EDD:   03/26/23  Best:          24w 3d     Det. By:  LMP  (06/25/22)          EDD:   04/01/23 ---------------------------------------------------------------------- Anatomy  Cranium:               Appears normal         Aortic Arch:            Appears normal  Cavum:                 Appears normal         Ductal Arch:            Not well visualized  Ventricles:            Appears normal         Diaphragm:              Appears normal  Choroid Plexus:        Appears normal         Stomach:                Appears normal, left                                                                        sided  Cerebellum:            Appears normal         Abdomen:                Appears normal  Posterior Fossa:       Appears normal         Abdominal Wall:         Appears nml (cord                                                                        insert, abd wall)  Nuchal Fold:           Appears normal         Cord Vessels:           Appears normal (3  vessel cord)  Face:                  Orbits nl; profile     Kidneys:                Bilat pyelectasis,                         limited                                                                        R 5mm, L 8mm  Lips:                  Appears normal         Bladder:                Appears normal  Thoracic:              Appears normal         Spine:                  Ltd views no                                                                        intracranial signs of                                                                        NTD  Heart:                 Appears normal         Upper Extremities:      Appears normal                         (4CH, axis, and                         situs)  RVOT:                  Appears normal         Lower Extremities:      Appears normal  LVOT:                  Appears normal  Other:  Heels/feet and open hands/5th digits, Nasal bone,  lenses, maxilla,          mandible and falx, VC, 3VV and 3VTV visualized.Fem Fetus appears          to be female. ---------------------------------------------------------------------- Cervix Uterus Adnexa  Cervix  Appears closed, without funnelling  Uterus  No abnormality visualized.  Right Ovary  Size(cm)  2.14   x   1.5    x  1.4       Vol(ml): 2.35  Within normal limits.  Left Ovary  Size(cm)     2.35   x   1.62   x  1.41      Vol(ml): 2.81  Within normal limits.  Cul De Sac  No free fluid seen.  Adnexa  No adnexal mass visualized ---------------------------------------------------------------------- Comments  The patient is at 24w 3d here for an anatomic survey. She  has no further concerns today.  EDD: 04/01/2023.  Dating: LMP  (06/25/22).  Other pregnancy complications: fetal UTD,  Aneuplolidy screening: no testing  AFP: no testing  Sonographic findings  Single intrauterine pregnancy.  Fetal cardiac activity:  Observed and appears normal.  Presentation: Cephalic.  There is bilateral UTD seen on today's [place]. The remainder of  the kidney parenchyma appears normal. There are no other  softer markers seen on today's [place]. Due to poor acoustic  windows, the visualization some structures remain  suboptimally seen.  Fetal biometry shows the estimated fetal weight at the 69  percentile.  Amniotic fluid:  MVP: 6.02 cm.  Placenta: Anterior.  Adnexa: No adnexal mass visualized.  Recommendations  I discussed the clinical significance of bilateral UTD as well  as the association with aneuploidy.  - Follow up ultrasound in 4 weeks to attempt visualization of  the anatomy not seen and reassess the fetal growth  - She would like aneuploidy screening, which can be done at  her next prenatal visit  - Amniocentesis was offered but declined  - Fetal echo has been scheduled due to early onset diabetes  - If the fetal UTD is still present prior to birth, consultation with  pediatric urology should be arranged.  ----------------------------------------------------------------------                  Braxton Feathers, DO Electronically Signed Final Report   12/13/2022 04:09 pm ----------------------------------------------------------------------  MR PELVIS WO CONTRAST  Result Date: 11/26/2022 CLINICAL DATA:  Suspect appendicitis EXAM: MRI PELVIS WITHOUT CONTRAST TECHNIQUE: Multiplanar multisequence MR imaging of the pelvis was performed. No intravenous contrast was administered. COMPARISON:  None Available. FINDINGS: Lower chest: No acute findings. Hepatobiliary: No mass or other parenchymal abnormality identified. Pancreas: No mass, inflammatory changes, or other parenchymal abnormality identified. Spleen:  Within normal limits in size and appearance. Adrenals/Urinary Tract: No masses identified. No evidence of hydronephrosis. Stomach/Bowel: The appendix is visualized and appears normal on axial images 11/7 and 8 and coronal images 7/15 and 16. No dilated bowel loops are seen. Stomach is within normal limits. Vascular/Lymphatic: No pathologically enlarged lymph nodes identified. No abdominal aortic aneurysm demonstrated. Other: There is trace free fluid in the right lower quadrant. Single intrauterine gestation is identified. The ovaries are unremarkable. Musculoskeletal: No suspicious bone lesions identified. IMPRESSION: 1. The appendix is visualized and appears normal. 2. Trace free fluid in the right lower quadrant. 3. Single intrauterine gestation. Electronically Signed   By: Darliss Cheney M.D.   On: 11/26/2022 22:41   MR ABDOMEN WO CONTRAST  Result Date: 11/26/2022 CLINICAL DATA:  Acute abdominal pain. Images 77 and 8 MR series axial images EXAM: MRI ABDOMEN WITHOUT CONTRAST TECHNIQUE: Multiplanar multisequence MR imaging was performed without the administration of intravenous contrast. COMPARISON:  None Available. FINDINGS: Lower chest: No acute findings. Hepatobiliary: No mass or other parenchymal abnormality  identified. Pancreas: No mass, inflammatory changes, or other parenchymal abnormality identified. Spleen:  Within normal limits in size  and appearance. Adrenals/Urinary Tract: No masses identified. No evidence of hydronephrosis. Stomach/Bowel: The appendix is visualized and appears normal on axial images 11/7 and 8 and coronal images 7/15 and 16. No dilated bowel loops are seen. Stomach is within normal limits. Vascular/Lymphatic: No pathologically enlarged lymph nodes identified. No abdominal aortic aneurysm demonstrated. Other: There is trace free fluid in the right lower quadrant. Single intrauterine gestation is identified. The ovaries are unremarkable. Musculoskeletal: No suspicious bone lesions identified. IMPRESSION: 1. The appendix is visualized and appears normal. 2. Trace free fluid in the right lower quadrant. 3. Single intrauterine gestation. Electronically Signed   By: Darliss Cheney M.D.   On: 11/26/2022 22:37    Recent Labs: Lab Results  Component Value Date   WBC 15.8 (H) 11/26/2022   HGB 10.7 (L) 11/26/2022   PLT 303 11/26/2022   NA 141 04/27/2021   K 4.0 04/27/2021   CL 107 04/27/2021   CO2 23 10/25/2020   GLUCOSE 88 04/27/2021   BUN 15 04/27/2021   CREATININE 0.50 04/27/2021   BILITOT 0.2 (L) 10/25/2020   ALKPHOS 49 10/25/2020   AST 18 10/25/2020   ALT 17 10/25/2020   PROT 7.2 10/25/2020   ALBUMIN 3.0 (L) 10/25/2020   CALCIUM 8.7 (L) 10/25/2020   GFRAA 155 06/02/2020    Speciality Comments: No specialty comments available.  Procedures:  No procedures performed Allergies: Patient has no known allergies.   Assessment / Plan:     Visit Diagnoses: Polyarthralgia - 11/21/21: RF negative, CRP WNL, anti-CCP negative -patient gives history of pain and discomfort in multiple joints since the last childbirth in April 2022.  She states the symptoms have been persistent.  She notices puffiness in her hands and her feet.  She had no synovitis on the examination today.  She gives  history of dry mouth.  There is no history of ulcers, nasal ulcers, malar rash, photosensitivity, Raynaud's, lymphadenopathy.  She gives history of hair thinning for the last few years.  I will obtain following labs today.  Plan: ANA, RNP Antibody, Anti-Smith antibody, Sjogrens syndrome-A extractable nuclear antibody, Anti-DNA antibody, double-stranded, C3 and C4, Anti-scleroderma antibody, Sjogrens syndrome-B extractable nuclear antibody.  Patient appears to have some myofascial pain with bilateral trapezius muscle spasm and gluteal discomfort.  She may also be experiencing some discomfort during pregnancy.  Stretching exercises were emphasized.  Elevated sed rate - ESR 45 on 11/21/21.  Her sed rate was elevated in May 2023.  I would repeat sed rate after the childbirth.  History of gastroesophageal reflux (GERD)  History of gestational hypertension-she is on metformin.  [redacted] weeks gestation of pregnancy  Language barrier-interpreter Sherian Maroon was present during the office visit.  Orders: Orders Placed This Encounter  Procedures   ANA   RNP Antibody   Anti-Smith antibody   Sjogrens syndrome-A extractable nuclear antibody   Anti-DNA antibody, double-stranded   C3 and C4   Anti-scleroderma antibody   Sjogrens syndrome-B extractable nuclear antibody   No orders of the defined types were placed in this encounter.   Follow-Up Instructions: Return for polyarthralgia.   Pollyann Savoy, MD  Note - This record has been created using Animal nutritionist.  Chart creation errors have been sought, but may not always  have been located. Such creation errors do not reflect on  the standard of medical care.

## 2022-12-13 ENCOUNTER — Ambulatory Visit: Payer: Medicaid Other | Attending: Family Medicine

## 2022-12-13 ENCOUNTER — Encounter: Payer: Self-pay | Admitting: *Deleted

## 2022-12-13 ENCOUNTER — Ambulatory Visit: Payer: Medicaid Other | Admitting: *Deleted

## 2022-12-13 VITALS — BP 117/71 | HR 84

## 2022-12-13 DIAGNOSIS — O24415 Gestational diabetes mellitus in pregnancy, controlled by oral hypoglycemic drugs: Secondary | ICD-10-CM | POA: Insufficient documentation

## 2022-12-13 DIAGNOSIS — Z363 Encounter for antenatal screening for malformations: Secondary | ICD-10-CM | POA: Insufficient documentation

## 2022-12-13 DIAGNOSIS — O09292 Supervision of pregnancy with other poor reproductive or obstetric history, second trimester: Secondary | ICD-10-CM | POA: Insufficient documentation

## 2022-12-13 DIAGNOSIS — Z3A24 24 weeks gestation of pregnancy: Secondary | ICD-10-CM | POA: Insufficient documentation

## 2022-12-13 DIAGNOSIS — Z3490 Encounter for supervision of normal pregnancy, unspecified, unspecified trimester: Secondary | ICD-10-CM | POA: Diagnosis not present

## 2022-12-14 ENCOUNTER — Other Ambulatory Visit: Payer: Self-pay | Admitting: *Deleted

## 2022-12-14 ENCOUNTER — Ambulatory Visit (INDEPENDENT_AMBULATORY_CARE_PROVIDER_SITE_OTHER): Payer: Medicaid Other | Admitting: Family Medicine

## 2022-12-14 VITALS — BP 114/79 | HR 106 | Wt 179.4 lb

## 2022-12-14 DIAGNOSIS — Z3490 Encounter for supervision of normal pregnancy, unspecified, unspecified trimester: Secondary | ICD-10-CM

## 2022-12-14 DIAGNOSIS — Z8759 Personal history of other complications of pregnancy, childbirth and the puerperium: Secondary | ICD-10-CM

## 2022-12-14 DIAGNOSIS — Z3482 Encounter for supervision of other normal pregnancy, second trimester: Secondary | ICD-10-CM

## 2022-12-14 DIAGNOSIS — O99810 Abnormal glucose complicating pregnancy: Secondary | ICD-10-CM

## 2022-12-14 DIAGNOSIS — Z362 Encounter for other antenatal screening follow-up: Secondary | ICD-10-CM

## 2022-12-14 MED ORDER — MAG-200 200 MG PO TABS: 1.0000 | ORAL_TABLET | Freq: Every day | ORAL | 0 refills | Status: DC

## 2022-12-14 MED ORDER — ACETAMINOPHEN 325 MG PO TABS: 650.0000 mg | ORAL_TABLET | Freq: Four times a day (QID) | ORAL | 0 refills | Status: DC | PRN

## 2022-12-14 NOTE — Patient Instructions (Signed)
It was great to see you!  -Continue taking your Metformin, prenatal vitamin, and aspirin daily -Continue checking your sugar-- both fasting (before eating) and 2 hours after eating. Keep a log of the numbers and bring it to your next appointment -For your leg cramps: most important thing is to stay well hydrated. I have also given a printed prescription for magnesium (also available over-the-counter) that you can take at bedtime -Tylenol as needed for pain/discomfort -We did genetic testing today. I will call with results.   Pregnancy Related Return Precautions The follow are signs/symptoms that are abnormal in pregnancy and may require further evaluation by a physician: Go to the MAU at [facility] at [facility] if: You have cramping/contractions that do not go away with drinking water, especially if they are lasting 30 seconds to 1.5 minutes, coming and going every 5-10 minutes for an hour or more, or are getting stronger and you cannot walk or talk while having a contraction/cramp. Your water breaks.  Sometimes it is a big gush of fluid, sometimes it is just a trickle that keeps getting your underwear wet or running down your legs You have vaginal bleeding.    You do not feel your baby moving like normal.  If you do not, get something to eat and drink (something cold or something with sugar like peanut butter or juice) and lay down and focus on feeling your baby move. If your baby is still not moving like normal, you should go to MAU. You should feel your baby move 6 times in one hour, or 10 times in two hours. You have a persistent headache that does not go away with 1 g of Tylenol, vision changes, chest pain, difficulty breathing, severe pain in your right upper abdomen, worsening leg swelling- these can all be signs of high blood pressure in pregnancy and need to be evaluated by a provider immediately  These are all concerning in pregnancy and if you have any of these I  recommend you call your PCP and present to the Maternity Admissions Unit (map below) for further evaluation.  For any pregnancy-related emergencies, please go to the Maternity Admissions Unit in the [facility] at [facility]. You will use hospital Entrance C.   Our clinic number is [phone].

## 2022-12-14 NOTE — Progress Notes (Signed)
  [facility] Prenatal Visit  Kelsey Mcdaniel is a 27 y.o. G3P2002 at [redacted]w[redacted]d here for routine follow up. She is dated by LMP=[redacted]w[redacted]d ultrasound.  She reports calf cramps at night, would like medication for this. States she was given something in prior pregnancy that helped. She reports good fetal movement. No bleeding, loss of fluid, contractions. See flow sheet for details. Vitals:   12/14/22 1337  BP: 114/79  Pulse: (!) 106    A/P: Pregnancy at [redacted]w[redacted]d.  Doing well.   Dating reviewed, dating tab is correct Fetal heart tones Appropriate 147 Fundal height within expected range.  24cm Anatomy ultrasound reviewed and notable for bilateral urinary tract dilation--- genetic screening done today, has f/u ultrasound scheduled in 4 weeks.  Influenza vaccine not administered as not influenza season. .  Preterm labor precautions given.   2. Pregnancy issues include the following and were addressed as appropriate today:   Pre-existing DM (diagnosed based on abnormal early 1hr GTT of 197)- on Metformin 500mg  daily. Brings home glucose log--readings range from 80-138, 4 elevated readings out of ~20 (2 elevated fasting, 2 elevated postprandial). Continue current dose of Metformin. Has f/u with [facility] on July 9th.  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 Seeing OB on July 9th.  Anemia in pregnancy- last hgb 10.7, continue PNV with iron  H/o gHTN- normotensive throughout this pregnancy so far. Continue ASA 81mg  daily  Leg cramps- emphasized good hydration. Printed Rx given for magox 200mg  at bedtime.  Problem list and pregnancy box updated: Yes.     Follow up in 1 week.

## 2022-12-21 ENCOUNTER — Ambulatory Visit (INDEPENDENT_AMBULATORY_CARE_PROVIDER_SITE_OTHER): Payer: Medicaid Other | Admitting: Family Medicine

## 2022-12-21 DIAGNOSIS — Z3492 Encounter for supervision of normal pregnancy, unspecified, second trimester: Secondary | ICD-10-CM | POA: Diagnosis not present

## 2022-12-21 DIAGNOSIS — Z3A25 25 weeks gestation of pregnancy: Secondary | ICD-10-CM

## 2022-12-21 DIAGNOSIS — O99810 Abnormal glucose complicating pregnancy: Secondary | ICD-10-CM

## 2022-12-21 MED ORDER — METFORMIN HCL ER 500 MG PO TB24: 500.0000 mg | ORAL_TABLET | Freq: Two times a day (BID) | ORAL | 1 refills | Status: DC

## 2022-12-21 NOTE — Patient Instructions (Addendum)
You have an OB appt on 01/01/2023 with Dr Donavan Foil at 1:00 PM.  Keep checking sugars.  Take metformin 500mg  twice daily  Goal fasting  < 90  Goal 2 hours after eating < 120   Prenatal Classes Go to [email or web address] for more information on the pregnancy and child birth classes that [place] has to offer.   Pregnancy Related Return Precautions The follow are signs/symptoms that are abnormal in pregnancy and may require further evaluation by a physician: Go to the MAU at [facility] at [facility] if: You have cramping/contractions that do not go away with drinking water, especially if they are lasting 30 seconds to 1.5 minutes, coming and going every 5-10 minutes for an hour or more, or are getting stronger and you cannot walk or talk while having a contraction/cramp. Your water breaks.  Sometimes it is a big gush of fluid, sometimes it is just a trickle that keeps getting your underwear wet or running down your legs You have vaginal bleeding.    You do not feel your baby moving like normal.  If you do not, get something to eat and drink (something cold or something with sugar like peanut butter or juice) and lay down and focus on feeling your baby move. If your baby is still not moving like normal, you should go to MAU. You should feel your baby move 6 times in one hour, or 10 times in two hours. You have a persistent headache that does not go away with 1 g of Tylenol, vision changes, chest pain, difficulty breathing, severe pain in your right upper abdomen, worsening leg swelling- these can all be signs of high blood pressure in pregnancy and need to be evaluated by a provider immediately  These are all concerning in pregnancy and if you have any of these I recommend you call your PCP and present to the Maternity Admissions Unit (map below) for further evaluation.  For any pregnancy-related emergencies, please go to the Maternity Admissions Unit  in the [facility]. You will use hospital Entrance C.    Our clinic number is [phone].   Dr Miquel Dunn

## 2022-12-21 NOTE — Progress Notes (Unsigned)
[facility] OB Clinic Visit  Kelsey Mcdaniel is a 27 y.o. G3P2002 at [redacted]w[redacted]d (via LMP c/w 14 wk sono) who presents to [facility] clinic for routine follow up. Pashto interpretor used for duration of visit. Prenatal course, history, notes, ultrasounds, and laboratory results reviewed.  Denies cramping/ctx, fluid leaking, vaginal bleeding, or decreased fetal movement. Taking PNV, baby aspirin, and metformin 500mg  once daily.    Vitals:   12/21/22 1043  BP: 100/64  Pulse: 89  Temp: 98.1 F (36.7 C)     Postpartum Plans: - delivery planning: PLTCS  FHR: 131 bpm Uterine size: 26 cm  Assessment & Plan  1. Routine prenatal care: - transferring to high risk OB due to pre-gestational DM  2. Pre-existing diabetes- BG values today with fasting ranging from 84-98 and 2 hr postprandial ranging from 106-162. Discussed increasing metformin to 500mg  BID and continuing to check BG and bring to next week appt with high risk OB. Fetal ECHO scheduled per MFM due to early onset diabetes.   3. Fetal pyelectasis bilaterally- noted on anatomy US, NIPT done and pending, f/u US scheduled in 4 weeks, per MFM if fetal urinary tract dilation still present prior to birth, consultation with pediatric urology should be arranged.  4. History of gestational HTN- on baby aspirin, asymptomatic and normotensive today.  5. History of fourth degree tear with rectovaginal fistula- then with last pregnancy had fourth degree with gas incontinence requiring revision surgery x2, elected for PLTCS this delivery. Will follow with OB for duration of care.  6. Anemia in pregnancy- CBC 11/26/22 appropriate Hgb 10.7. continue prenatal vitamin with iron.  Next prenatal visit in 1 weeks with high risk OB. Labor & fetal movement precautions discussed.  Burley Saver, MD [facility]

## 2022-12-25 ENCOUNTER — Encounter: Payer: Self-pay | Admitting: Rheumatology

## 2022-12-25 ENCOUNTER — Ambulatory Visit: Payer: Medicaid Other | Attending: Rheumatology | Admitting: Rheumatology

## 2022-12-25 VITALS — BP 126/77 | HR 96 | Resp 15 | Ht 64.5 in | Wt 178.4 lb

## 2022-12-25 DIAGNOSIS — Z8719 Personal history of other diseases of the digestive system: Secondary | ICD-10-CM | POA: Diagnosis not present

## 2022-12-25 DIAGNOSIS — Z603 Acculturation difficulty: Secondary | ICD-10-CM

## 2022-12-25 DIAGNOSIS — R700 Elevated erythrocyte sedimentation rate: Secondary | ICD-10-CM

## 2022-12-25 DIAGNOSIS — Z758 Other problems related to medical facilities and other health care: Secondary | ICD-10-CM

## 2022-12-25 DIAGNOSIS — Z3A26 26 weeks gestation of pregnancy: Secondary | ICD-10-CM

## 2022-12-25 DIAGNOSIS — M2550 Pain in unspecified joint: Secondary | ICD-10-CM | POA: Diagnosis not present

## 2022-12-25 DIAGNOSIS — Z8759 Personal history of other complications of pregnancy, childbirth and the puerperium: Secondary | ICD-10-CM

## 2022-12-26 LAB — SJOGRENS SYNDROME-A EXTRACTABLE NUCLEAR ANTIBODY: SSA (Ro) (ENA) Antibody, IgG: <1 AI

## 2022-12-26 LAB — RNP ANTIBODY: Ribonucleic Protein(ENA) Antibody, IgG: <1 AI

## 2022-12-26 LAB — ANTI-DNA ANTIBODY, DOUBLE-STRANDED: ds DNA Ab: <1 IU/mL

## 2022-12-26 LAB — ANTI-SCLERODERMA ANTIBODY: Scleroderma (Scl-70) (ENA) Antibody, IgG: <1 AI

## 2022-12-27 LAB — C3 AND C4
C3 Complement: 164 mg/dL (ref 83–193)
C4 Complement: 23 mg/dL (ref 15–57)

## 2022-12-27 LAB — ANTI-SMITH ANTIBODY: ENA SM Ab Ser-aCnc: <1 AI

## 2022-12-27 LAB — SJOGRENS SYNDROME-B EXTRACTABLE NUCLEAR ANTIBODY: SSB (La) (ENA) Antibody, IgG: <1 AI

## 2022-12-27 LAB — ANA: Anti Nuclear Antibody (ANA): NEGATIVE

## 2022-12-28 NOTE — Progress Notes (Signed)
Office Visit Note  Patient: Kelsey Mcdaniel             Date of Birth: 10-29-1995           MRN: [id number]             PCP: Billey Co, MD Referring: Billey Co, MD Visit Date: 01/10/2023 Occupation: @GUAROCC @  Interpreter: Salvadore Oxford with CAP  Subjective:  Joint pain   History of Present Illness: Kelsey Mcdaniel is a 27 y.o. female who returns for the follow-up visit to discuss the lab results today.  Patient states the discomfort she was experiencing at the last visit has completely resolved.  She denies any joint pain or joint swelling today.  She denies any history of oral ulcers, nasal ulcers, malar rash, photosensitivity, Raynaud's phenomenon or lymphadenopathy.    Activities of Daily Living:  Patient reports morning stiffness for a few minutes.   Patient Reports nocturnal pain.  Difficulty dressing/grooming: Reports Difficulty climbing stairs: Reports Difficulty getting out of chair: Reports Difficulty using hands for taps, buttons, cutlery, and/or writing: Reports  Review of Systems  Constitutional:  Negative for fatigue.  HENT:  Positive for mouth dryness. Negative for mouth sores.   Eyes:  Negative for dryness.  Respiratory:  Positive for shortness of breath. Negative for cough and wheezing.   Cardiovascular:  Negative for palpitations.  Gastrointestinal:  Negative for blood in stool, constipation and diarrhea.  Endocrine: Negative for increased urination.  Genitourinary:  Negative for involuntary urination.  Musculoskeletal:  Positive for gait problem, myalgias, morning stiffness and myalgias. Negative for joint pain, joint pain, joint swelling, muscle weakness and muscle tenderness.  Skin:  Positive for hair loss. Negative for color change, rash and sensitivity to sunlight.  Allergic/Immunologic: Negative for susceptible to infections.  Neurological:  Positive for headaches. Negative for dizziness.  Hematological:  Negative for swollen glands.   Psychiatric/Behavioral:  Negative for depressed mood and sleep disturbance. The patient is not nervous/anxious.     PMFS History:  Patient Active Problem List   Diagnosis Date Noted   Preexisting diabetes complicating pregnancy, antepartum 01/01/2023   H/O maternal fourth degree perineal laceration, currently pregnant 01/01/2023   Abnormal glucose tolerance affecting pregnancy, antepartum 11/09/2022   GERD (gastroesophageal reflux disease) 11/09/2022   Supervision of normal pregnancy 08/17/2022   Constipation 01/13/2021   History of gestational hypertension 06/30/2020    Past Medical History:  Diagnosis Date   Cloacal malformation    Gestational diabetes    History of pregnancy induced hypertension     Family History  Problem Relation Age of Onset   Diabetes Mother    Heart disease Mother    Diabetes Father    Heart disease Father    Healthy Sister    Healthy Sister    Healthy Sister    Healthy Sister    Healthy Brother    Healthy Brother    Healthy Brother    Healthy Daughter    Healthy Daughter    Past Surgical History:  Procedure Laterality Date   PERINEOPLASTY N/A 04/10/2021   Procedure: PERINEOPLASTY;  Surgeon: Marguerita Beards, MD;  Location: [facility];  Service: Gynecology;  Laterality: N/A;   PERINEOPLASTY N/A 04/27/2021   Procedure: PERINEOPLASTY;  Surgeon: Marguerita Beards, MD;  Location: [facility];  Service: Gynecology;  Laterality: N/A;  total time requested is 1 hour   RECTOVAGINAL FISTULA CLOSURE  2021   Social History   Social History Narrative  Not on file   Immunization History  Administered Date(s) Administered   Hepatitis A, Adult 07/31/2021   Hepatitis B, ADULT 07/31/2021   Influenza,inj,Quad PF,6+ Mos 07/31/2021   Influenza-Unspecified 04/07/2020   Janssen (J&J) SARS-COV-2 Vaccination 03/18/2020   MMR 07/31/2021   Moderna SARS-COV2 Booster Vaccination 06/29/2020   Tdap 07/22/2020      Objective: Vital Signs: BP 111/74 (BP Location: Left Arm, Patient Position: Sitting, Cuff Size: Normal)   Pulse 92   Resp 16   Ht 5' 4.5" (1.638 m)   Wt 179 lb (81.2 kg)   LMP 06/25/2022 (Exact Date)   BMI 30.25 kg/m    Physical Exam Vitals and nursing note reviewed.  Constitutional:      Appearance: She is well-developed.  HENT:     Head: Normocephalic and atraumatic.  Eyes:     Conjunctiva/sclera: Conjunctivae normal.  Cardiovascular:     Rate and Rhythm: Normal rate and regular rhythm.     Heart sounds: Normal heart sounds.  Pulmonary:     Effort: Pulmonary effort is normal.     Breath sounds: Normal breath sounds.  Abdominal:     General: Bowel sounds are normal.     Palpations: Abdomen is soft.  Musculoskeletal:     Cervical back: Normal range of motion.  Lymphadenopathy:     Cervical: No cervical adenopathy.  Skin:    General: Skin is warm and dry.     Capillary Refill: Capillary refill takes less than 2 seconds.  Neurological:     Mental Status: She is alert and oriented to person, place, and time.  Psychiatric:        Behavior: Behavior normal.      Musculoskeletal Exam: Cervical spine, thoracic spine were in good range of motion.  Shoulder joints, elbow joints, wrist joints, MCPs PIPs and DIPs in good range of motion with no synovitis.  Hip joints were in good range of motion without discomfort.  Knee joints in good range of motion without any warmth swelling or effusion.  There was no tenderness over ankles or MTPs.  CDAI Exam: CDAI Score: -- Patient Global: --; Provider Global: -- Swollen: --; Tender: -- Joint Exam 01/10/2023   No joint exam has been documented for this visit   There is currently no information documented on the homunculus. Go to the Rheumatology activity and complete the homunculus joint exam.  Investigation: No additional findings.  Imaging: [place] MFM OB DETAIL +14 WK  Result Date:  12/13/2022 ----------------------------------------------------------------------  OBSTETRICS REPORT                       (Signed Final 12/13/2022 04:09 pm) ---------------------------------------------------------------------- Patient Info  ID #:       [id number]                          D.O.B.:  Apr 10, 1996 (27 yrs)  Name:       [facility]                    Visit Date: 12/13/2022 02:16 pm ---------------------------------------------------------------------- Performed By  Attending:        Braxton Feathers DO       Referred By:      [facility] MAU/Triage  Performed By:     Reinaldo Raddle            [facility] ---------------------------------------------------------------------- Orders  #  Description                           Code        Ordered By  1  [place] MFM OB DETAIL +14 WK               76811.01    MARSHALL                                                       CHAMBLISS ----------------------------------------------------------------------  #  Order #                     Accession #                Episode #  1  [id number]                   [id number]                 [id number] ---------------------------------------------------------------------- Indications  Poor obstetric history: Previous               O09.299  preeclampsia / eclampsia/gestational HTN  Anemia during pregnancy in second trimester    O99.012  Gestational diabetes in pregnancy, diet        O24.410  controlled  Pyelectasis of fetus on prenatal ultrasound    O28.3  Encounter for antenatal screening for          Z36.3  malformations  [redacted] weeks gestation of pregnancy                Z3A.24  Declined testing ---------------------------------------------------------------------- Fetal Evaluation  Num Of Fetuses:         1  Fetal  Heart Rate(bpm):  147  Cardiac Activity:       Observed  Presentation:           Cephalic  Placenta:               Anterior  P. Cord Insertion:      Visualized, central  Amniotic Fluid  AFI FV:      Within normal limits                              Largest Pocket(cm)  6.02 ---------------------------------------------------------------------- Biometry  BPD:      64.3  mm     G. Age:  26w 0d         90  %    CI:        78.41   %    70 - 86                                                          FL/HC:      19.7   %    18.7 - 20.9  HC:      229.7  mm     G. Age:  25w 0d         52  %    HC/AC:      1.13        1.05 - 1.21  AC:      203.9  mm     G. Age:  25w 0d         59  %    FL/BPD:     70.5   %    71 - 87  FL:       45.3  mm     G. Age:  25w 0d         55  %    FL/AC:      22.2   %    20 - 24  HUM:      40.6  mm     G. Age:  24w 5d         47  %  CER:      27.5  mm     G. Age:  24w 4d         61  %  LV:        4.7  mm  CM:        7.8  mm  Est. FW:     763  gm    1 lb 11 oz      69  % ---------------------------------------------------------------------- OB History  Blood Type:   A+  Gravidity:    3         Term:   2        Prem:   0        SAB:   0  TOP:          0       Ectopic:  0        Living: 2 ---------------------------------------------------------------------- Gestational Age  LMP:           24w 3d        Date:  06/25/22                   EDD:   04/01/23  U/S Today:     25w 2d                                        EDD:   03/26/23  Best:          24w 3d     Det. By:  LMP  (06/25/22)          EDD:   04/01/23 ----------------------------------------------------------------------  Anatomy  Cranium:               Appears normal         Aortic Arch:            Appears normal  Cavum:                 Appears normal         Ductal Arch:            Not well visualized  Ventricles:            Appears normal         Diaphragm:              Appears normal  Choroid Plexus:        Appears normal          Stomach:                Appears normal, left                                                                        sided  Cerebellum:            Appears normal         Abdomen:                Appears normal  Posterior Fossa:       Appears normal         Abdominal Wall:         Appears nml (cord                                                                        insert, abd wall)  Nuchal Fold:           Appears normal         Cord Vessels:           Appears normal (3                                                                        vessel cord)  Face:                  Orbits nl; profile     Kidneys:                Bilat pyelectasis,                         limited  R 5mm, L 8mm  Lips:                  Appears normal         Bladder:                Appears normal  Thoracic:              Appears normal         Spine:                  Ltd views no                                                                        intracranial signs of                                                                        NTD  Heart:                 Appears normal         Upper Extremities:      Appears normal                         (4CH, axis, and                         situs)  RVOT:                  Appears normal         Lower Extremities:      Appears normal  LVOT:                  Appears normal  Other:  Heels/feet and open hands/5th digits, Nasal bone, lenses, maxilla,          mandible and falx, VC, 3VV and 3VTV visualized.Fem Fetus appears          to be female. ---------------------------------------------------------------------- Cervix Uterus Adnexa  Cervix  Appears closed, without funnelling  Uterus  No abnormality visualized.  Right Ovary  Size(cm)     2.14   x   1.5    x  1.4       Vol(ml): 2.35  Within normal limits.  Left Ovary  Size(cm)     2.35   x   1.62   x  1.41      Vol(ml): 2.81  Within normal limits.  Cul De Sac  No free  fluid seen.  Adnexa  No adnexal mass visualized ---------------------------------------------------------------------- Comments  The patient is at 24w 3d here for an anatomic survey. She  has no further concerns today.  EDD: 04/01/2023.  Dating: LMP  (06/25/22).  Other pregnancy complications: fetal UTD,  Aneuplolidy screening: no testing  AFP: no testing  Sonographic findings  Single intrauterine pregnancy.  Fetal cardiac activity:  Observed and appears normal.  Presentation: Cephalic.  There is  bilateral UTD seen on today's [place]. The remainder of  the kidney parenchyma appears normal. There are no other  softer markers seen on today's [place]. Due to poor acoustic  windows, the visualization some structures remain  suboptimally seen.  Fetal biometry shows the estimated fetal weight at the 69  percentile.  Amniotic fluid:  MVP: 6.02 cm.  Placenta: Anterior.  Adnexa: No adnexal mass visualized.  Recommendations  I discussed the clinical significance of bilateral UTD as well  as the association with aneuploidy.  - Follow up ultrasound in 4 weeks to attempt visualization of  the anatomy not seen and reassess the fetal growth  - She would like aneuploidy screening, which can be done at  her next prenatal visit  - Amniocentesis was offered but declined  - Fetal echo has been scheduled due to early onset diabetes  - If the fetal UTD is still present prior to birth, consultation with  pediatric urology should be arranged. ----------------------------------------------------------------------                  Braxton Feathers, DO Electronically Signed Final Report   12/13/2022 04:09 pm ----------------------------------------------------------------------   Recent Labs: Lab Results  Component Value Date   WBC 15.8 (H) 11/26/2022   HGB 10.7 (L) 11/26/2022   PLT 303 11/26/2022   NA 141 04/27/2021   K 4.0 04/27/2021   CL 107 04/27/2021   CO2 23 10/25/2020   GLUCOSE 88 04/27/2021   BUN 15 04/27/2021   CREATININE 0.50  04/27/2021   BILITOT 0.2 (L) 10/25/2020   ALKPHOS 49 10/25/2020   AST 18 10/25/2020   ALT 17 10/25/2020   PROT 7.2 10/25/2020   ALBUMIN 3.0 (L) 10/25/2020   CALCIUM 8.7 (L) 10/25/2020   GFRAA 155 06/02/2020   December 25, 2022 ANA negative, ENA (Smith, double-stranded DNA, SCL 70, SSA, SSB) negative, C3-C4 normal  Speciality Comments: No specialty comments available.  Procedures:  No procedures performed Allergies: Patient has no known allergies.   Assessment / Plan:     Visit Diagnoses: Polyarthralgia - History of pain in multiple joints since her last childbirth in April 2022.  Patient states the discomfort she was experiencing has improved since the last visit.  She denies any discomfort today.  She does have difficulty with mobility due to pregnancy.  No synovitis was noted on the examination today.  We had a detailed discussion regarding the lab work which was obtained at the last visit.  ANA was negative, ENA panel was negative and the complements were normal.  Patient voiced understanding.  I advised her to contact me if she develops any new symptoms.  Elevated sed rate - Most likely due to pregnancy.  History of gastroesophageal reflux (GERD)  [redacted] weeks gestation of pregnancy  History of gestational hypertension  Language barrier-interpreter was present during the office visit.  Orders: No orders of the defined types were placed in this encounter.  No orders of the defined types were placed in this encounter.    Follow-Up Instructions: Return if symptoms worsen or fail to improve, for arthralgias.   Pollyann Savoy, MD  Note - This record has been created using Animal nutritionist.  Chart creation errors have been sought, but may not always  have been located. Such creation errors do not reflect on  the standard of medical care.

## 2022-12-28 NOTE — Progress Notes (Signed)
All the labs are within normal limits.  I will discuss results at the follow-up visit.

## 2023-01-01 ENCOUNTER — Ambulatory Visit (INDEPENDENT_AMBULATORY_CARE_PROVIDER_SITE_OTHER): Payer: Medicaid Other | Admitting: Certified Nurse Midwife

## 2023-01-01 VITALS — BP 100/64 | HR 91 | Wt 178.0 lb

## 2023-01-01 DIAGNOSIS — O24319 Unspecified pre-existing diabetes mellitus in pregnancy, unspecified trimester: Secondary | ICD-10-CM

## 2023-01-01 DIAGNOSIS — Z3482 Encounter for supervision of other normal pregnancy, second trimester: Secondary | ICD-10-CM

## 2023-01-01 DIAGNOSIS — Z8759 Personal history of other complications of pregnancy, childbirth and the puerperium: Secondary | ICD-10-CM

## 2023-01-01 DIAGNOSIS — Z3A27 27 weeks gestation of pregnancy: Secondary | ICD-10-CM | POA: Diagnosis not present

## 2023-01-01 DIAGNOSIS — Z758 Other problems related to medical facilities and other health care: Secondary | ICD-10-CM

## 2023-01-01 DIAGNOSIS — O09299 Supervision of pregnancy with other poor reproductive or obstetric history, unspecified trimester: Secondary | ICD-10-CM

## 2023-01-01 DIAGNOSIS — O24419 Gestational diabetes mellitus in pregnancy, unspecified control: Secondary | ICD-10-CM | POA: Insufficient documentation

## 2023-01-01 DIAGNOSIS — Z31430 Encounter of female for testing for genetic disease carrier status for procreative management: Secondary | ICD-10-CM

## 2023-01-01 DIAGNOSIS — O24312 Unspecified pre-existing diabetes mellitus in pregnancy, second trimester: Secondary | ICD-10-CM

## 2023-01-01 DIAGNOSIS — Z603 Acculturation difficulty: Secondary | ICD-10-CM

## 2023-01-01 DIAGNOSIS — O09292 Supervision of pregnancy with other poor reproductive or obstetric history, second trimester: Secondary | ICD-10-CM | POA: Diagnosis not present

## 2023-01-01 DIAGNOSIS — Z3483 Encounter for supervision of other normal pregnancy, third trimester: Secondary | ICD-10-CM

## 2023-01-04 NOTE — Progress Notes (Signed)
PRENATAL VISIT NOTE  Subjective:  Kelsey Mcdaniel is a 27 y.o. G3P2002 at [redacted]w[redacted]d being seen today for ongoing prenatal care.  She is currently monitored for the following issues for this low-risk pregnancy and has History of gestational hypertension; Constipation; Supervision of normal pregnancy; Abnormal glucose tolerance affecting pregnancy, antepartum; GERD (gastroesophageal reflux disease); Preexisting diabetes complicating pregnancy, antepartum; and H/O maternal fourth degree perineal laceration, currently pregnant on their problem list.  Patient reports no complaints.  Contractions: Not present. Vag. Bleeding: None.  Movement: Present. Denies leaking of fluid.   The following portions of the patient's history were reviewed and updated as appropriate: allergies, current medications, past family history, past medical history, past social history, past surgical history and problem list.   Objective:   Vitals:   01/01/23 1339  BP: 100/64  Pulse: 91  Weight: 178 lb (80.7 kg)    Fetal Status: Fetal Heart Rate (bpm): 160   Movement: Present     General:  Alert, oriented and cooperative. Patient is in no acute distress.  Skin: Skin is warm and dry. No rash noted.   Cardiovascular: Normal heart rate noted  Respiratory: Normal respiratory effort, no problems with respiration noted  Abdomen: Soft, gravid, appropriate for gestational age.  Pain/Pressure: Absent     Pelvic: Cervical exam deferred        Extremities: Normal range of motion.  Edema: None  Mental Status: Normal mood and affect. Normal behavior. Normal judgment and thought content.   Assessment and Plan:  Pregnancy: G3P2002 at [redacted]w[redacted]d 1. [redacted] weeks gestation of pregnancy - Patient doing well  - She reports vigorous and frequent fetal movement.  - FOB and 2 young girls present for visit today and supportive.  - Fundal height appropriate for gestational age.   2. Encounter for supervision of other normal pregnancy in second  trimester - Transfer of care from [facility] to [facility]  3. History of gestational hypertension - BPs stable at todays visit. Patient not current taking medication for BPs  - Given history, recommended that patient start taking a daily baby aspirin. Patient verbalized understanding.    4. Preexisting diabetes complicating pregnancy, antepartum - Patient currently taking Metformin @ HS.  - Reviewed blood sugars and > 50% of 2hr PP outside of range.  - Reviewed with Dr. Donavan Foil and MD recommendation to increase Metformin to BID. Reviewed with patient and patient husband and both agreeable to plan of care.   5. H/O maternal fourth degree perineal laceration, currently pregnant - Patient request a Primary C/S given previous 4th degree tear.  - Reviewed that next visit will be with MD to discuss C/S.   6. Encounter for supervision of other normal pregnancy in second trimester - PANORAMA PRENATAL TEST  7. Encounter of female for testing for genetic disease carrier status for procreative management - HORIZON Basic Panel  Preterm labor symptoms and general obstetric precautions including but not limited to vaginal bleeding, contractions, leaking of fluid and fetal movement were reviewed in detail with the patient. Please refer to After Visit Summary for other counseling recommendations.   Return for HROB with FEMALE PROVIDERS ONLY .  Future Appointments  Date Time Provider Department Center  01/10/2023 11:20 AM Pollyann Savoy, MD [facility] None  01/14/2023  1:30 PM [facility] US2 [facility]  01/15/2023  2:30 PM Constant, Gigi Gin, MD [facility] None  01/28/2023  9:55 AM Constant, Gigi Gin, MD [facility] None  02/11/2023  8:55 AM Constant, Gigi Gin, MD [facility] None    Hamlin Devine (  Dorathy Daft, MSN, CNM  [facility]  01/04/2023 8:15 PM

## 2023-01-08 LAB — HORIZON CUSTOM: REPORT SUMMARY: NEGATIVE

## 2023-01-09 LAB — PANORAMA PRENATAL TEST FULL PANEL:PANORAMA TEST PLUS 5 ADDITIONAL MICRODELETIONS

## 2023-01-10 ENCOUNTER — Ambulatory Visit: Payer: Medicaid Other | Attending: Rheumatology | Admitting: Rheumatology

## 2023-01-10 ENCOUNTER — Encounter: Payer: Self-pay | Admitting: Rheumatology

## 2023-01-10 VITALS — BP 111/74 | HR 92 | Resp 16 | Ht 64.5 in | Wt 179.0 lb

## 2023-01-10 DIAGNOSIS — Z3A26 26 weeks gestation of pregnancy: Secondary | ICD-10-CM

## 2023-01-10 DIAGNOSIS — Z758 Other problems related to medical facilities and other health care: Secondary | ICD-10-CM

## 2023-01-10 DIAGNOSIS — Z603 Acculturation difficulty: Secondary | ICD-10-CM

## 2023-01-10 DIAGNOSIS — M2550 Pain in unspecified joint: Secondary | ICD-10-CM | POA: Diagnosis not present

## 2023-01-10 DIAGNOSIS — Z3A28 28 weeks gestation of pregnancy: Secondary | ICD-10-CM | POA: Diagnosis not present

## 2023-01-10 DIAGNOSIS — Z8759 Personal history of other complications of pregnancy, childbirth and the puerperium: Secondary | ICD-10-CM

## 2023-01-10 DIAGNOSIS — R700 Elevated erythrocyte sedimentation rate: Secondary | ICD-10-CM

## 2023-01-10 DIAGNOSIS — Z8719 Personal history of other diseases of the digestive system: Secondary | ICD-10-CM | POA: Diagnosis not present

## 2023-01-13 ENCOUNTER — Encounter: Payer: Self-pay | Admitting: Obstetrics & Gynecology

## 2023-01-13 DIAGNOSIS — O285 Abnormal chromosomal and genetic finding on antenatal screening of mother: Secondary | ICD-10-CM | POA: Insufficient documentation

## 2023-01-14 ENCOUNTER — Other Ambulatory Visit: Payer: Self-pay | Admitting: *Deleted

## 2023-01-14 ENCOUNTER — Ambulatory Visit: Payer: Medicaid Other | Attending: Maternal & Fetal Medicine

## 2023-01-14 DIAGNOSIS — Z362 Encounter for other antenatal screening follow-up: Secondary | ICD-10-CM | POA: Insufficient documentation

## 2023-01-14 DIAGNOSIS — O09293 Supervision of pregnancy with other poor reproductive or obstetric history, third trimester: Secondary | ICD-10-CM

## 2023-01-14 DIAGNOSIS — O24415 Gestational diabetes mellitus in pregnancy, controlled by oral hypoglycemic drugs: Secondary | ICD-10-CM | POA: Diagnosis not present

## 2023-01-14 DIAGNOSIS — O99013 Anemia complicating pregnancy, third trimester: Secondary | ICD-10-CM

## 2023-01-14 DIAGNOSIS — O99810 Abnormal glucose complicating pregnancy: Secondary | ICD-10-CM | POA: Diagnosis present

## 2023-01-14 DIAGNOSIS — D649 Anemia, unspecified: Secondary | ICD-10-CM | POA: Diagnosis not present

## 2023-01-14 DIAGNOSIS — Z3A29 29 weeks gestation of pregnancy: Secondary | ICD-10-CM

## 2023-01-14 DIAGNOSIS — Z8759 Personal history of other complications of pregnancy, childbirth and the puerperium: Secondary | ICD-10-CM | POA: Diagnosis not present

## 2023-01-14 DIAGNOSIS — Z3689 Encounter for other specified antenatal screening: Secondary | ICD-10-CM

## 2023-01-15 ENCOUNTER — Ambulatory Visit (INDEPENDENT_AMBULATORY_CARE_PROVIDER_SITE_OTHER): Payer: Medicaid Other | Admitting: Obstetrics and Gynecology

## 2023-01-15 ENCOUNTER — Encounter: Payer: Self-pay | Admitting: Obstetrics and Gynecology

## 2023-01-15 VITALS — BP 116/78 | HR 86 | Wt 180.0 lb

## 2023-01-15 DIAGNOSIS — K219 Gastro-esophageal reflux disease without esophagitis: Secondary | ICD-10-CM

## 2023-01-15 DIAGNOSIS — Z3483 Encounter for supervision of other normal pregnancy, third trimester: Secondary | ICD-10-CM

## 2023-01-15 DIAGNOSIS — Z8759 Personal history of other complications of pregnancy, childbirth and the puerperium: Secondary | ICD-10-CM

## 2023-01-15 DIAGNOSIS — O09299 Supervision of pregnancy with other poor reproductive or obstetric history, unspecified trimester: Secondary | ICD-10-CM

## 2023-01-15 DIAGNOSIS — O99810 Abnormal glucose complicating pregnancy: Secondary | ICD-10-CM

## 2023-01-15 DIAGNOSIS — O24419 Gestational diabetes mellitus in pregnancy, unspecified control: Secondary | ICD-10-CM

## 2023-01-15 MED ORDER — ACCU-CHEK SOFTCLIX LANCETS MISC: 12 refills | Status: DC

## 2023-01-15 MED ORDER — MAGNESIUM 30 MG PO TABS: 30.0000 mg | ORAL_TABLET | Freq: Two times a day (BID) | ORAL | 1 refills | Status: DC

## 2023-01-15 MED ORDER — PANTOPRAZOLE SODIUM 40 MG PO TBEC: 40.0000 mg | DELAYED_RELEASE_TABLET | Freq: Every day | ORAL | 2 refills | Status: DC

## 2023-01-15 MED ORDER — CYCLOBENZAPRINE HCL 10 MG PO TABS: 10.0000 mg | ORAL_TABLET | Freq: Three times a day (TID) | ORAL | 2 refills | Status: DC | PRN

## 2023-01-15 MED ORDER — METFORMIN HCL ER 500 MG PO TB24
500.0000 mg | ORAL_TABLET | Freq: Two times a day (BID) | ORAL | 1 refills | Status: DC
Start: 2023-01-15 — End: 2023-03-04

## 2023-01-15 MED ORDER — FAMOTIDINE 10 MG PO TABS
10.0000 mg | ORAL_TABLET | Freq: Two times a day (BID) | ORAL | 3 refills | Status: DC | PRN
Start: 2023-01-15 — End: 2023-03-21

## 2023-01-15 NOTE — Progress Notes (Signed)
Pt is having pain on left side of head x 10 days - no relief with Tylenol. Pt doesn't have hx of migraines.  States it is worse with sounds.   Pt has glucose reading with her today.  Pt had u/s yesterday and scheduled for antenatal testing.

## 2023-01-15 NOTE — Progress Notes (Signed)
   PRENATAL VISIT NOTE  Subjective:  Kelsey Mcdaniel is a 27 y.o. G3P2002 at [redacted]w[redacted]d being seen today for ongoing prenatal care.  She is currently monitored for the following issues for this high-risk pregnancy and has History of gestational hypertension; Constipation; Supervision of normal pregnancy; Abnormal glucose tolerance affecting pregnancy, antepartum; GERD (gastroesophageal reflux disease); Gestational diabetes mellitus (GDM) affecting pregnancy, antepartum; H/O maternal fourth degree perineal laceration, currently pregnant; and Abnormal genetic test during pregnancy on their problem list.  Patient reports headache.  Contractions: Not present. Vag. Bleeding: None.  Movement: Present. Denies leaking of fluid.   The following portions of the patient's history were reviewed and updated as appropriate: allergies, current medications, past family history, past medical history, past social history, past surgical history and problem list.   Objective:   Vitals:   01/15/23 1442  BP: 116/78  Pulse: 86  Weight: 180 lb (81.6 kg)    Fetal Status: Fetal Heart Rate (bpm): 140 Fundal Height: 30 cm Movement: Present     General:  Alert, oriented and cooperative. Patient is in no acute distress.  Skin: Skin is warm and dry. No rash noted.   Cardiovascular: Normal heart rate noted  Respiratory: Normal respiratory effort, no problems with respiration noted  Abdomen: Soft, gravid, appropriate for gestational age.  Pain/Pressure: Absent     Pelvic: Cervical exam deferred        Extremities: Normal range of motion.     Mental Status: Normal mood and affect. Normal behavior. Normal judgment and thought content.   Assessment and Plan:  Pregnancy: G3P2002 at [redacted]w[redacted]d 1. Encounter for supervision of other normal pregnancy in third trimester Patient is doing well She reports a tension headache not relieved by tylenol- rx flexeril provided Patient plans condoms for contraception Interpreter present during  encounter  2. Gestational diabetes mellitus (GDM) affecting pregnancy, antepartum CBGs reviewed and patient has been checking fasting which are mostly within range and post dinner only (majority within range) Education provided on better CBG monitoring Continue metformin at current dosing 7/22 EFW 1604 gm (90%tile) Antenatal testing to start on 8/12  3. H/O maternal fourth degree perineal laceration, currently pregnant Desires primary c-section  4. History of gestational hypertension normotensive  Preterm labor symptoms and general obstetric precautions including but not limited to vaginal bleeding, contractions, leaking of fluid and fetal movement were reviewed in detail with the patient. Please refer to After Visit Summary for other counseling recommendations.   Return in about 2 weeks (around 01/29/2023) for in person, ROB, High risk.  Future Appointments  Date Time Provider Department Center  01/28/2023  9:55 AM Analynn Daum, Gigi Gin, MD [facility] None  02/04/2023  7:30 AM [facility]  02/11/2023  7:30 AM [facility]  02/11/2023  8:55 AM Leota Maka, Gigi Gin, MD [facility] None  02/18/2023  7:30 AM [facility]  02/26/2023  7:30 AM [facility]3 [facility]    Catalina Antigua, MD

## 2023-01-28 ENCOUNTER — Other Ambulatory Visit: Payer: Self-pay

## 2023-01-28 ENCOUNTER — Encounter: Payer: Medicaid Other | Admitting: Obstetrics and Gynecology

## 2023-01-28 DIAGNOSIS — O285 Abnormal chromosomal and genetic finding on antenatal screening of mother: Secondary | ICD-10-CM

## 2023-02-04 ENCOUNTER — Ambulatory Visit: Payer: Medicaid Other | Attending: Obstetrics

## 2023-02-04 DIAGNOSIS — Z3689 Encounter for other specified antenatal screening: Secondary | ICD-10-CM | POA: Insufficient documentation

## 2023-02-04 DIAGNOSIS — O24415 Gestational diabetes mellitus in pregnancy, controlled by oral hypoglycemic drugs: Secondary | ICD-10-CM

## 2023-02-04 DIAGNOSIS — Z362 Encounter for other antenatal screening follow-up: Secondary | ICD-10-CM | POA: Insufficient documentation

## 2023-02-04 DIAGNOSIS — O99013 Anemia complicating pregnancy, third trimester: Secondary | ICD-10-CM

## 2023-02-04 DIAGNOSIS — Z3A32 32 weeks gestation of pregnancy: Secondary | ICD-10-CM

## 2023-02-04 DIAGNOSIS — D649 Anemia, unspecified: Secondary | ICD-10-CM

## 2023-02-04 DIAGNOSIS — O09293 Supervision of pregnancy with other poor reproductive or obstetric history, third trimester: Secondary | ICD-10-CM | POA: Diagnosis not present

## 2023-02-11 ENCOUNTER — Ambulatory Visit (INDEPENDENT_AMBULATORY_CARE_PROVIDER_SITE_OTHER): Payer: Medicaid Other | Admitting: Obstetrics and Gynecology

## 2023-02-11 ENCOUNTER — Ambulatory Visit: Payer: Medicaid Other | Attending: Obstetrics and Gynecology

## 2023-02-11 ENCOUNTER — Encounter: Payer: Self-pay | Admitting: Obstetrics and Gynecology

## 2023-02-11 ENCOUNTER — Other Ambulatory Visit: Payer: Self-pay | Admitting: *Deleted

## 2023-02-11 VITALS — BP 106/71 | HR 106 | Wt 179.0 lb

## 2023-02-11 DIAGNOSIS — Z3A33 33 weeks gestation of pregnancy: Secondary | ICD-10-CM | POA: Diagnosis not present

## 2023-02-11 DIAGNOSIS — O09299 Supervision of pregnancy with other poor reproductive or obstetric history, unspecified trimester: Secondary | ICD-10-CM

## 2023-02-11 DIAGNOSIS — O09293 Supervision of pregnancy with other poor reproductive or obstetric history, third trimester: Secondary | ICD-10-CM | POA: Insufficient documentation

## 2023-02-11 DIAGNOSIS — Z362 Encounter for other antenatal screening follow-up: Secondary | ICD-10-CM | POA: Insufficient documentation

## 2023-02-11 DIAGNOSIS — O24419 Gestational diabetes mellitus in pregnancy, unspecified control: Secondary | ICD-10-CM | POA: Diagnosis not present

## 2023-02-11 DIAGNOSIS — Z3483 Encounter for supervision of other normal pregnancy, third trimester: Secondary | ICD-10-CM

## 2023-02-11 DIAGNOSIS — Z3689 Encounter for other specified antenatal screening: Secondary | ICD-10-CM | POA: Insufficient documentation

## 2023-02-11 DIAGNOSIS — O99013 Anemia complicating pregnancy, third trimester: Secondary | ICD-10-CM

## 2023-02-11 DIAGNOSIS — O24415 Gestational diabetes mellitus in pregnancy, controlled by oral hypoglycemic drugs: Secondary | ICD-10-CM

## 2023-02-11 DIAGNOSIS — Z1339 Encounter for screening examination for other mental health and behavioral disorders: Secondary | ICD-10-CM | POA: Diagnosis not present

## 2023-02-11 DIAGNOSIS — Z8759 Personal history of other complications of pregnancy, childbirth and the puerperium: Secondary | ICD-10-CM

## 2023-02-11 DIAGNOSIS — D649 Anemia, unspecified: Secondary | ICD-10-CM

## 2023-02-11 MED ORDER — CYCLOBENZAPRINE HCL 10 MG PO TABS: 10.0000 mg | ORAL_TABLET | Freq: Three times a day (TID) | ORAL | 2 refills | Status: DC | PRN

## 2023-02-11 NOTE — Progress Notes (Signed)
Pt would like to know if her surgery date will be changed based on her [place].

## 2023-02-11 NOTE — Progress Notes (Signed)
   PRENATAL VISIT NOTE  Subjective:  Kelsey Mcdaniel is a 27 y.o. G3P2002 at [redacted]w[redacted]d being seen today for ongoing prenatal care.  She is currently monitored for the following issues for this high-risk pregnancy and has History of gestational hypertension; Constipation; Supervision of normal pregnancy; Abnormal glucose tolerance affecting pregnancy, antepartum; GERD (gastroesophageal reflux disease); Gestational diabetes mellitus (GDM) affecting pregnancy, antepartum; H/O maternal fourth degree perineal laceration, currently pregnant; and Abnormal genetic test during pregnancy on their problem list.  Patient reports no complaints.  Contractions: Irritability. Vag. Bleeding: None.  Movement: Present. Denies leaking of fluid.   The following portions of the patient's history were reviewed and updated as appropriate: allergies, current medications, past family history, past medical history, past social history, past surgical history and problem list.   Objective:   Vitals:   02/11/23 0908  BP: 106/71  Pulse: (!) 106  Weight: 179 lb (81.2 kg)    Fetal Status: Fetal Heart Rate (bpm): 149 Fundal Height: 33 cm Movement: Present     General:  Alert, oriented and cooperative. Patient is in no acute distress.  Skin: Skin is warm and dry. No rash noted.   Cardiovascular: Normal heart rate noted  Respiratory: Normal respiratory effort, no problems with respiration noted  Abdomen: Soft, gravid, appropriate for gestational age.  Pain/Pressure: Absent     Pelvic: Cervical exam deferred        Extremities: Normal range of motion.  Edema: None  Mental Status: Normal mood and affect. Normal behavior. Normal judgment and thought content.   Assessment and Plan:  Pregnancy: G3P2002 at [redacted]w[redacted]d 1. Encounter for supervision of other normal pregnancy in third trimester Patient is doing well without complaints  2. Gestational diabetes mellitus (GDM) affecting pregnancy, antepartum CBGs reviewed and majority within  range. A few postprandial above range due to dietary indiscretions Plan to continue metformin at current dose Continue antenatal testing 8/19 EFW 2668 gm (97%tile)   3. H/O maternal fourth degree perineal laceration, currently pregnant Patient desires primary c-section Per MFM, due to LGA, delivery at 38 weeks  4. History of gestational hypertension Normotensive  Preterm labor symptoms and general obstetric precautions including but not limited to vaginal bleeding, contractions, leaking of fluid and fetal movement were reviewed in detail with the patient. Please refer to After Visit Summary for other counseling recommendations.   Return in about 2 weeks (around 02/25/2023) for in person, ROB, High risk.  Future Appointments  Date Time Provider Department Center  02/18/2023  7:30 AM [facility] US3 [facility]  02/18/2023  9:00 AM [facility] GENETIC COUNSELING RM [facility]  02/26/2023  7:30 AM [facility] US[facility]  03/04/2023  8:45 AM [facility] NST [facility]  03/11/2023  7:30 AM [facility] US2 [facility]    Catalina Antigua, MD

## 2023-02-18 ENCOUNTER — Ambulatory Visit: Payer: Medicaid Other

## 2023-02-18 ENCOUNTER — Ambulatory Visit: Payer: Medicaid Other | Attending: Family Medicine

## 2023-02-18 DIAGNOSIS — O285 Abnormal chromosomal and genetic finding on antenatal screening of mother: Secondary | ICD-10-CM

## 2023-02-18 DIAGNOSIS — Z3689 Encounter for other specified antenatal screening: Secondary | ICD-10-CM | POA: Insufficient documentation

## 2023-02-18 DIAGNOSIS — O35EXX0 Maternal care for other (suspected) fetal abnormality and damage, fetal genitourinary anomalies, not applicable or unspecified: Secondary | ICD-10-CM | POA: Diagnosis not present

## 2023-02-18 DIAGNOSIS — O99013 Anemia complicating pregnancy, third trimester: Secondary | ICD-10-CM | POA: Diagnosis not present

## 2023-02-18 DIAGNOSIS — Z362 Encounter for other antenatal screening follow-up: Secondary | ICD-10-CM | POA: Diagnosis present

## 2023-02-18 DIAGNOSIS — O24415 Gestational diabetes mellitus in pregnancy, controlled by oral hypoglycemic drugs: Secondary | ICD-10-CM | POA: Diagnosis not present

## 2023-02-18 DIAGNOSIS — Z3A34 34 weeks gestation of pregnancy: Secondary | ICD-10-CM

## 2023-02-18 DIAGNOSIS — D649 Anemia, unspecified: Secondary | ICD-10-CM

## 2023-02-18 DIAGNOSIS — O09293 Supervision of pregnancy with other poor reproductive or obstetric history, third trimester: Secondary | ICD-10-CM | POA: Diagnosis present

## 2023-02-22 NOTE — Progress Notes (Signed)
[facility] at [facility] [address] Phone:  [phone]   Fax:  [phone]    Name: Kelsey Mcdaniel Indication: Atypical finding on NIPS.  DOB: [birth year] Age: 27 y.o.   EDD: 04/01/2023 LMP: 06/25/2022 Referring Provider:  Billey Co, MD   EGA: [redacted]w[redacted]d  Genetic Counselor: Sheppard Plumber, MS, GC  OB Hx: [id number] Date of Appointment: 02/18/2023  Accompanied by: Shahpeerai (Pashto interpreter) Face to Face Time: 30 Minutes   Pregnancy History:   This is Kelsey Mcdaniel's third pregnancy. She has two living children.  Denies personal history of diabetes, high blood pressure, thyroid conditions, and seizures. Denies bleeding, infections, and fevers in this pregnancy. Denies using tobacco, alcohol, or street drugs in this pregnancy.   Family History: A three-generation pedigree was created and scanned into Epic under the Media tab.  Kelsey Mcdaniel reports her father has hypertension, cardiac-related problems, and diabetes. She reports her mother has hypertension. We discussed that these conditions are typically multifactorial in nature. Therefore, they are typically affected by genetics and environmental factors and are usually not due to a single underlying genetic cause. Without a known genetic cause to the conditions, it is difficult to assess the risks for the fetus and other family members.    She reports her husband has paternal female cousins with mental delay and mental health concerns that developed in adulthood. She does not have additional information regarding this. Mental health concerns can be caused by a combination of genetics and the environment. Without a known genetic cause or medical report, we are unable to further assess risk to the fetus and family members. If she obtains additional information, we are happy to revisit this in the future.  Maternal ethnicity reported as [place] and paternal ethnicity reported as [place].  Denies Ashkenazi Jewish ancestry.  Family history is remarkable for consanguinity. Kelsey Mcdaniel and her husband are consanguineous. Patient's paternal great-aunt's son is Kelsey Mcdaniel's husband. Therefore, they are first cousins once removed. Kelsey Mcdaniel are second cousins, as are her maternal grandparents. Patient's husband's Mcdaniel are first cousins.  Family history not remarkable for individuals with birth defects, autism spectrum disorder, multiple spontaneous abortions, still births, or unexplained neonatal death.     Atypical finding on cfDNA (NIPS). Cell-free DNA (cfDNA) screening, also known as noninvasive prenatal screening (NIPS), analyzes cell-free DNA originating from the placenta that is found in the maternal blood circulation during pregnancy to provide information regarding the presence or absence of extra DNA for chromosomes 13, 18, and 21 as well as the sex chromosomes. Kelsey Mcdaniel NIPS result returned as an atypical finding. This finding could not be further characterized, and the origin could not be specified. Possible causes for this finding can include the origin of the finding being both maternal and placental, multiple areas of homozygosity, confined placental mosaicism, and/or technical limitations of the test. The finding can also be a normal variant.  We discussed that this finding may be due to consanguinity in the family history; however, we cannot confirm this without further testing. Consanguinity describes reproductive partners who are second cousins or closer in family relationship. It refers to the degree of relatedness or genetic sharing between reproductive partners. The closer the couple is by blood relationship, the higher the chance they share pathogenic variants in recessive genes that can be passed to their children, which can increase the chance for birth defects. The general population risk for birth defects is 3%-5%;  however, there is an additional  2%-3% increase in risk for first cousin unions in families with no known genetic conditions. Due to the additional levels of consanguinity in each the patient's and her husband's families, a more appropriate risk could not be estimated. Couples in a consanguineous relationship can be offered expanded carrier screening and prenatal diagnosis through amniocentesis. Due to the limitations of the testing and the reported result, we also discussed the option of amniocentesis and reviewed the technical aspects, benefits, and limitations, including the 1 in 500 chance for preterm delivery. The patient declined any additional screening/testing during this pregnancy.   Carrier screening. Kelsey Mcdaniel previously completed carrier screening. She screened to not be a carrier for cystic fibrosis (CF), spinal muscular atrophy (SMA), alpha thalassemia, and beta hemoglobinopathies. Please see report for details. A negative result on carrier screening reduces but does not eliminate the chance of being a carrier.   Birth Defects. All babies have approximately a 3-5% risk for a birth defect and a majority of these defects cannot be detected through the screening or diagnostic testing listed. Ultrasound may detect some birth defects, but it may not detect all birth defects. About half of pregnancies with Down syndrome do not show any soft markers on ultrasound. A normal ultrasound does not guarantee a healthy pregnancy.  Newborn Screening. The [place] Newborn Screening (NBS) program will screen all newborn babies for cystic fibrosis, spinal muscular atrophy, hemoglobinopathies, and numerous other conditions.    Patient Plan:  Proceed with: Routine prenatal care Informed consent was obtained. All questions were answered.  Declined: Amniocentesis. Carrier screening.   Thank you for sharing in the care of Kelsey Mcdaniel with [place].  Please do not hesitate to contact us at [phone] if you have any questions.  Sheppard Plumber, MS, GC Genetic Counselor  Genetic counseling student involved in appointment: No.

## 2023-02-26 ENCOUNTER — Ambulatory Visit: Payer: Medicaid Other | Attending: Obstetrics and Gynecology

## 2023-02-26 VITALS — BP 120/68

## 2023-02-26 DIAGNOSIS — Z362 Encounter for other antenatal screening follow-up: Secondary | ICD-10-CM | POA: Diagnosis present

## 2023-02-26 DIAGNOSIS — O1493 Unspecified pre-eclampsia, third trimester: Secondary | ICD-10-CM | POA: Insufficient documentation

## 2023-02-26 DIAGNOSIS — D649 Anemia, unspecified: Secondary | ICD-10-CM

## 2023-02-26 DIAGNOSIS — O09293 Supervision of pregnancy with other poor reproductive or obstetric history, third trimester: Secondary | ICD-10-CM | POA: Insufficient documentation

## 2023-02-26 DIAGNOSIS — O35EXX0 Maternal care for other (suspected) fetal abnormality and damage, fetal genitourinary anomalies, not applicable or unspecified: Secondary | ICD-10-CM | POA: Diagnosis not present

## 2023-02-26 DIAGNOSIS — O24419 Gestational diabetes mellitus in pregnancy, unspecified control: Secondary | ICD-10-CM | POA: Insufficient documentation

## 2023-02-26 DIAGNOSIS — Z3483 Encounter for supervision of other normal pregnancy, third trimester: Secondary | ICD-10-CM | POA: Insufficient documentation

## 2023-02-26 DIAGNOSIS — Z3A35 35 weeks gestation of pregnancy: Secondary | ICD-10-CM | POA: Insufficient documentation

## 2023-02-26 DIAGNOSIS — O99013 Anemia complicating pregnancy, third trimester: Secondary | ICD-10-CM | POA: Insufficient documentation

## 2023-02-26 DIAGNOSIS — Z3689 Encounter for other specified antenatal screening: Secondary | ICD-10-CM | POA: Insufficient documentation

## 2023-02-26 DIAGNOSIS — O24415 Gestational diabetes mellitus in pregnancy, controlled by oral hypoglycemic drugs: Secondary | ICD-10-CM | POA: Diagnosis not present

## 2023-02-28 ENCOUNTER — Ambulatory Visit: Payer: Self-pay | Admitting: Family Medicine

## 2023-03-04 ENCOUNTER — Other Ambulatory Visit: Payer: Self-pay | Admitting: Obstetrics and Gynecology

## 2023-03-04 ENCOUNTER — Encounter: Payer: Self-pay | Admitting: Obstetrics and Gynecology

## 2023-03-04 ENCOUNTER — Ambulatory Visit: Payer: Medicaid Other | Attending: Maternal & Fetal Medicine | Admitting: *Deleted

## 2023-03-04 ENCOUNTER — Ambulatory Visit (INDEPENDENT_AMBULATORY_CARE_PROVIDER_SITE_OTHER): Payer: Medicaid Other | Admitting: Obstetrics and Gynecology

## 2023-03-04 VITALS — BP 118/65

## 2023-03-04 VITALS — BP 124/85 | HR 109 | Wt 185.0 lb

## 2023-03-04 DIAGNOSIS — O24419 Gestational diabetes mellitus in pregnancy, unspecified control: Secondary | ICD-10-CM | POA: Diagnosis present

## 2023-03-04 DIAGNOSIS — Z3A36 36 weeks gestation of pregnancy: Secondary | ICD-10-CM | POA: Insufficient documentation

## 2023-03-04 DIAGNOSIS — O24415 Gestational diabetes mellitus in pregnancy, controlled by oral hypoglycemic drugs: Secondary | ICD-10-CM | POA: Diagnosis not present

## 2023-03-04 DIAGNOSIS — O280 Abnormal hematological finding on antenatal screening of mother: Secondary | ICD-10-CM

## 2023-03-04 DIAGNOSIS — O99810 Abnormal glucose complicating pregnancy: Secondary | ICD-10-CM

## 2023-03-04 DIAGNOSIS — O24410 Gestational diabetes mellitus in pregnancy, diet controlled: Secondary | ICD-10-CM

## 2023-03-04 DIAGNOSIS — E669 Obesity, unspecified: Secondary | ICD-10-CM | POA: Diagnosis not present

## 2023-03-04 DIAGNOSIS — O09299 Supervision of pregnancy with other poor reproductive or obstetric history, unspecified trimester: Secondary | ICD-10-CM

## 2023-03-04 DIAGNOSIS — Z3483 Encounter for supervision of other normal pregnancy, third trimester: Secondary | ICD-10-CM

## 2023-03-04 MED ORDER — METFORMIN HCL ER 500 MG PO TB24
500.0000 mg | ORAL_TABLET | Freq: Two times a day (BID) | ORAL | 1 refills | Status: DC
Start: 2023-03-04 — End: 2023-03-04

## 2023-03-04 MED ORDER — LANCETS MISC. MISC
0 refills | Status: DC
Start: 2023-03-04 — End: 2023-03-21

## 2023-03-04 MED ORDER — ACCU-CHEK GUIDE VI STRP
100.0000 | ORAL_STRIP | Freq: Four times a day (QID) | 0 refills | Status: DC
Start: 2023-03-04 — End: 2023-03-21

## 2023-03-04 NOTE — Progress Notes (Signed)
Pt states she is having some pelvic pain and ctx.  Pt has glucose reading with her today for review.  Pt defers vaginal swabs today.

## 2023-03-04 NOTE — Procedures (Signed)
Kelsey Mcdaniel February 05, 1996 [redacted]w[redacted]d  Fetus A Non-Stress Test Interpretation for 03/04/23 (NST only)  Indication:  GDM-diet, obesity  Fetal Heart Rate A Mode: External Baseline Rate (A): 145 bpm Variability: Moderate Accelerations: 15 x 15 Decelerations: None Multiple birth?: No  Uterine Activity Mode: Palpation, Toco Contraction Frequency (min): Rare Contraction Quality: Mild Resting Tone Palpated: Relaxed Resting Time: Adequate  Interpretation (Fetal Testing) Nonstress Test Interpretation: Reactive Comments: Dr. Parke Poisson reviewed tracing.

## 2023-03-04 NOTE — Progress Notes (Signed)
Subjective:  Kelsey Mcdaniel is a 27 y.o. G3P2002 at [redacted]w[redacted]d being seen today for ongoing prenatal care.  She is currently monitored for the following issues for this high-risk pregnancy and has History of gestational hypertension; Constipation; Supervision of normal pregnancy; Abnormal glucose tolerance affecting pregnancy, antepartum; GERD (gastroesophageal reflux disease); Gestational diabetes mellitus (GDM) affecting pregnancy, antepartum; H/O maternal fourth degree perineal laceration, currently pregnant; and Abnormal genetic test during pregnancy on their problem list.  Patient reports  general discomforts of pregnancy .  Contractions: Irregular. Vag. Bleeding: None.  Movement: Present. Denies leaking of fluid.   The following portions of the patient's history were reviewed and updated as appropriate: allergies, current medications, past family history, past medical history, past social history, past surgical history and problem list. Problem list updated.  Objective:   Vitals:   03/04/23 1442  BP: 124/85  Pulse: (!) 109  Weight: 185 lb (83.9 kg)    Fetal Status: Fetal Heart Rate (bpm): 150   Movement: Present     General:  Alert, oriented and cooperative. Patient is in no acute distress.  Skin: Skin is warm and dry. No rash noted.   Cardiovascular: Normal heart rate noted  Respiratory: Normal respiratory effort, no problems with respiration noted  Abdomen: Soft, gravid, appropriate for gestational age. Pain/Pressure: Present     Pelvic:  Cervical exam deferred        Extremities: Normal range of motion.     Mental Status: Normal mood and affect. Normal behavior. Normal judgment and thought content.   Urinalysis:      Assessment and Plan:  Pregnancy: G3P2002 at [redacted]w[redacted]d  1. Encounter for supervision of other normal pregnancy in third trimester Vaginal cultures at next OB visit Prefers female provided  2. Gestational diabetes mellitus (GDM) affecting pregnancy, antepartum CBG's in  goal range for the most part - metFORMIN (GLUCOPHAGE-XR) 500 MG 24 hr tablet; Take 1 tablet (500 mg total) by mouth 2 (two) times daily with a meal.  Dispense: 60 tablet; Refill: 1 - Lancets Misc. MISC; Use to check blood sugar up to 4 times daily. May substitute to any manufacturer covered by patient's insurance.  Dispense: 100 each; Refill: 0 - glucose blood (ACCU-CHEK GUIDE) test strip; 100 each by Other route 4 (four) times daily. for testing  Dispense: 100 strip; Refill: 0  3. H/O maternal fourth degree perineal laceration, currently pregnant For C section   Preterm labor symptoms and general obstetric precautions including but not limited to vaginal bleeding, contractions, leaking of fluid and fetal movement were reviewed in detail with the patient. Please refer to After Visit Summary for other counseling recommendations.  Return in about 1 week (around 03/11/2023) for ROB/GBS, OB visit.   Hermina Staggers, MD

## 2023-03-05 NOTE — Patient Instructions (Signed)
Kelsey Mcdaniel  03/05/2023   Your procedure is scheduled on:  03/18/2023  Arrive at 1015 at Entrance C on [address] at [facility]  and [address]. You are invited to use the FREE valet parking or use the Visitor's parking deck.  Pick up the phone at the desk and dial [phone].  Call this number if you have problems the morning of surgery: [phone]  Remember:   Do not eat food:(After Midnight) Desps de medianoche.  Do not drink clear liquids: (After Midnight) Desps de medianoche.  Take these medicines the morning of surgery with A SIP OF WATER:  none   Do not wear jewelry, make-up or nail polish.  Do not wear lotions, powders, or perfumes. Do not wear deodorant.  Do not shave 48 hours prior to surgery.  Do not bring valuables to the hospital.  [facility] is not   responsible for any belongings or valuables brought to the hospital.  Contacts, dentures or bridgework may not be worn into surgery.  Leave suitcase in the car. After surgery it may be brought to your room.  For patients admitted to the hospital, checkout time is 11:00 AM the day of              discharge.      Please read over the following fact sheets that you were given:     Preparing for Surgery

## 2023-03-07 ENCOUNTER — Telehealth (HOSPITAL_COMMUNITY): Payer: Self-pay | Admitting: *Deleted

## 2023-03-07 NOTE — Telephone Encounter (Signed)
Preadmission screen  

## 2023-03-08 ENCOUNTER — Encounter: Payer: Self-pay | Admitting: *Deleted

## 2023-03-11 ENCOUNTER — Ambulatory Visit: Payer: Medicaid Other | Attending: Maternal & Fetal Medicine

## 2023-03-11 VITALS — BP 116/67 | HR 88

## 2023-03-11 DIAGNOSIS — O09293 Supervision of pregnancy with other poor reproductive or obstetric history, third trimester: Secondary | ICD-10-CM | POA: Diagnosis not present

## 2023-03-11 DIAGNOSIS — O09299 Supervision of pregnancy with other poor reproductive or obstetric history, unspecified trimester: Secondary | ICD-10-CM | POA: Diagnosis present

## 2023-03-11 DIAGNOSIS — O24419 Gestational diabetes mellitus in pregnancy, unspecified control: Secondary | ICD-10-CM | POA: Diagnosis present

## 2023-03-11 DIAGNOSIS — O99013 Anemia complicating pregnancy, third trimester: Secondary | ICD-10-CM

## 2023-03-11 DIAGNOSIS — D649 Anemia, unspecified: Secondary | ICD-10-CM

## 2023-03-11 DIAGNOSIS — O24415 Gestational diabetes mellitus in pregnancy, controlled by oral hypoglycemic drugs: Secondary | ICD-10-CM | POA: Diagnosis present

## 2023-03-11 DIAGNOSIS — Z3A37 37 weeks gestation of pregnancy: Secondary | ICD-10-CM

## 2023-03-12 ENCOUNTER — Encounter (HOSPITAL_COMMUNITY): Payer: Self-pay

## 2023-03-13 NOTE — Patient Instructions (Addendum)
Prerana Jodoin  03/13/2023   Your procedure is scheduled on:  03/18/2023  Arrive at 0915 at Entrance C on [address] at [facility]  and [employer]. You are invited to use the FREE valet parking or use the Visitor's parking deck.  Pick up the phone at the desk and dial [phone].  Call this number if you have problems the morning of surgery: [phone]  Remember:   Do not eat food:(After Midnight) Desps de medianoche.  Do not drink clear liquids: (After Midnight) Desps de medianoche.  Take these medicines the morning of surgery with A SIP OF WATER:  none   Do not wear jewelry, make-up or nail polish.  Do not wear lotions, powders, or perfumes. Do not wear deodorant.  Do not shave 48 hours prior to surgery.  Do not bring valuables to the hospital.  [facility] is not   responsible for any belongings or valuables brought to the hospital.  Contacts, dentures or bridgework may not be worn into surgery.  Leave suitcase in the car. After surgery it may be brought to your room.  For patients admitted to the hospital, checkout time is 11:00 AM the day of              discharge.      Please read over the following fact sheets that you were given:     Preparing for Surgery

## 2023-03-14 ENCOUNTER — Other Ambulatory Visit (HOSPITAL_COMMUNITY)
Admission: RE | Admit: 2023-03-14 | Discharge: 2023-03-14 | Disposition: A | Discharge status: Home / Self Care | Payer: Medicaid Other | Source: Ambulatory Visit | Attending: Obstetrics and Gynecology | Admitting: Obstetrics and Gynecology

## 2023-03-14 ENCOUNTER — Encounter (HOSPITAL_COMMUNITY): Payer: Self-pay | Admitting: Obstetrics and Gynecology

## 2023-03-14 ENCOUNTER — Inpatient Hospital Stay (HOSPITAL_COMMUNITY)
Admission: AD | Admit: 2023-03-14 | Discharge: 2023-03-14 | Disposition: A | Discharge status: Home / Self Care | Payer: Medicaid Other | Attending: Obstetrics and Gynecology | Admitting: Obstetrics and Gynecology

## 2023-03-14 ENCOUNTER — Encounter (HOSPITAL_COMMUNITY)
Admission: RE | Admit: 2023-03-14 | Discharge: 2023-03-14 | Disposition: A | Discharge status: Home / Self Care | Payer: Medicaid Other | Source: Ambulatory Visit | Attending: Family Medicine | Admitting: Family Medicine

## 2023-03-14 ENCOUNTER — Ambulatory Visit (INDEPENDENT_AMBULATORY_CARE_PROVIDER_SITE_OTHER): Payer: Medicaid Other | Admitting: Obstetrics and Gynecology

## 2023-03-14 VITALS — BP 118/78 | HR 114 | Wt 182.4 lb

## 2023-03-14 DIAGNOSIS — Z8759 Personal history of other complications of pregnancy, childbirth and the puerperium: Secondary | ICD-10-CM

## 2023-03-14 DIAGNOSIS — O09299 Supervision of pregnancy with other poor reproductive or obstetric history, unspecified trimester: Secondary | ICD-10-CM

## 2023-03-14 DIAGNOSIS — Z3A37 37 weeks gestation of pregnancy: Secondary | ICD-10-CM | POA: Diagnosis not present

## 2023-03-14 DIAGNOSIS — O24419 Gestational diabetes mellitus in pregnancy, unspecified control: Secondary | ICD-10-CM

## 2023-03-14 DIAGNOSIS — Z3483 Encounter for supervision of other normal pregnancy, third trimester: Secondary | ICD-10-CM | POA: Insufficient documentation

## 2023-03-14 DIAGNOSIS — O471 False labor at or after 37 completed weeks of gestation: Secondary | ICD-10-CM | POA: Diagnosis present

## 2023-03-14 MED ORDER — METFORMIN HCL ER 500 MG PO TB24
1000.0000 mg | ORAL_TABLET | Freq: Two times a day (BID) | ORAL | 0 refills | Status: DC
Start: 2023-03-14 — End: 2023-03-21

## 2023-03-14 NOTE — MAU Provider Note (Signed)
RN Labor Check  S: Ms. Kelsey Mcdaniel is a 27 y.o. G3P2002 at [redacted]w[redacted]d  who presents to MAU today complaining contractions q 6 minutes since 8pm. She denies vaginal bleeding. She denies LOF. She reports normal fetal movement.    O: BP 117/77   Pulse (!) 107   Ht 5\' 4"  (1.626 m)   Wt 82.6 kg   LMP 06/25/2022 (Exact Date)   SpO2 99%   BMI 31.24 kg/m  GENERAL: Well-developed, well-nourished female in no acute distress.  HEAD: Normocephalic, atraumatic.  CHEST: Normal effort of breathing, regular heart rate ABDOMEN: Soft, nontender, gravid  Cervical exam:  Dilation: Closed Effacement (%): Thick Exam by:: A Sherlon Handing RN  Fetal Monitoring: Baseline: 125 Variability: moderate Accelerations: present Decelerations: absent Contractions: q6 min   A: SIUP at [redacted]w[redacted]d  False labor  P: Patient was evaluated by RN for labor check Discharge home with labor precautions  Lennart Pall, MD 03/14/2023 10:57 PM

## 2023-03-14 NOTE — Progress Notes (Signed)
Pt. Presents for ROB. Pt. States that she is having contractions every 25 to 30 min.

## 2023-03-14 NOTE — Progress Notes (Signed)
   PRENATAL VISIT NOTE  Subjective:  Kelsey Mcdaniel is a 27 y.o. G3P2002 at [redacted]w[redacted]d being seen today for ongoing prenatal care.  She is currently monitored for the following issues for this high-risk pregnancy and has History of gestational hypertension; Abnormal glucose tolerance affecting pregnancy, antepartum; Gestational diabetes mellitus (GDM) affecting pregnancy, antepartum; H/O maternal fourth degree perineal laceration, currently pregnant; and Abnormal genetic test during pregnancy on their problem list.  Patient reports contractions every 25-30 min apart. Really worried about going in to labor before c/s. Contractions: Irregular. Vag. Bleeding: None.  Movement: Present. Denies leaking of fluid.   The following portions of the patient's history were reviewed and updated as appropriate: allergies, current medications, past family history, past medical history, past social history, past surgical history and problem list.   Objective:   Vitals:   03/14/23 0930  BP: 118/78  Pulse: (!) 114  Weight: 182 lb 6.4 oz (82.7 kg)    Fetal Status: Fetal Heart Rate (bpm): 155   Movement: Present     General:  Alert, oriented and cooperative. Patient is in no acute distress.  Skin: Skin is warm and dry. No rash noted.   Cardiovascular: Normal heart rate noted  Respiratory: Normal respiratory effort, no problems with respiration noted  Abdomen: Soft, gravid, appropriate for gestational age.  Pain/Pressure: Present     Pelvic: Cervical exam deferred        Extremities: Normal range of motion.  Edema: None  Mental Status: Normal mood and affect. Normal behavior. Normal judgment and thought content.   Assessment and Plan:  Pregnancy: G3P2002 at [redacted]w[redacted]d  1. Encounter for supervision of other normal pregnancy in third trimester BP and FHR normal Feeling regular fetal movement Precautions discussed when to go to hospital for contractions  2. Gestational diabetes mellitus (GDM) affecting pregnancy,  antepartum Majority elevated Increase metformin to 1000 mg BID  3. History of gestational hypertension Normotensive   4. H/O maternal fourth degree perineal laceration, currently pregnant Desires c/s Scheduled for Monday 9/23 scheduled with female provider, have requested to switch to female, cannot change day d/t schedule  She is aware to arrive at 9am for preop 9/16 u/s efw 93%, ac >99%, BPP 8/8 5. 37 weeks Swabs collected   Term labor symptoms and general obstetric precautions including but not limited to vaginal bleeding, contractions, leaking of fluid and fetal movement were reviewed in detail with the patient. Please refer to After Visit Summary for other counseling recommendations.    Albertine Grates, FNP

## 2023-03-14 NOTE — Addendum Note (Signed)
Addended by: Jearld Adjutant on: 03/14/2023 02:24 PM   Modules accepted: Orders

## 2023-03-14 NOTE — MAU Note (Signed)
.  Arabela Eiring is a 27 y.o. at [redacted]w[redacted]d here in MAU reporting regular ctxs since 2000. Reports good FM and denies LOF or VB. For primary c/s Monday due to 4th degree tear with 2nd delivery that required surgical repair  Onset of complaint: 2000 Pain score: 9 Vitals:   03/14/23 2201 03/14/23 2202  BP:  117/77  Pulse: (!) 107   SpO2: 99%      FHT:135 Lab orders placed from triage:  none

## 2023-03-15 ENCOUNTER — Encounter (HOSPITAL_COMMUNITY)
Admission: RE | Admit: 2023-03-15 | Discharge: 2023-03-15 | Disposition: A | Discharge status: Home / Self Care | Payer: Medicaid Other | Source: Ambulatory Visit | Attending: Family Medicine | Admitting: Family Medicine

## 2023-03-15 LAB — CERVICOVAGINAL ANCILLARY ONLY
Chlamydia: NEGATIVE
Comment: NEGATIVE
Comment: NEGATIVE
Comment: NORMAL
Neisseria Gonorrhea: NEGATIVE
Trichomonas: NEGATIVE

## 2023-03-17 NOTE — Anesthesia Preprocedure Evaluation (Signed)
Anesthesia Evaluation  Patient identified by MRN, date of birth, ID band Patient awake    Reviewed: Allergy & Precautions, NPO status , Patient's Chart, lab work & pertinent test results  History of Anesthesia Complications Negative for: history of anesthetic complications  Airway Mallampati: II  TM Distance: >3 FB Neck ROM: Full    Dental no notable dental hx.    Pulmonary neg pulmonary ROS   Pulmonary exam normal        Cardiovascular negative cardio ROS Normal cardiovascular exam     Neuro/Psych negative neurological ROS     GI/Hepatic Neg liver ROS,GERD  Medicated,,  Endo/Other  diabetes, Gestational, Oral Hypoglycemic Agents    Renal/GU negative Renal ROS     Musculoskeletal negative musculoskeletal ROS (+)    Abdominal   Peds  Hematology negative hematology ROS (+)   Anesthesia Other Findings Day of surgery medications reviewed with patient.  Reproductive/Obstetrics                             Anesthesia Physical Anesthesia Plan  ASA: 2  Anesthesia Plan: Spinal   Post-op Pain Management: Ofirmev IV (intra-op)*   Induction:   PONV Risk Score and Plan: 4 or greater and Treatment may vary due to age or medical condition, Ondansetron and Dexamethasone  Airway Management Planned: Natural Airway  Additional Equipment: None  Intra-op Plan:   Post-operative Plan:   Informed Consent: I have reviewed the patients History and Physical, chart, labs and discussed the procedure including the risks, benefits and alternatives for the proposed anesthesia with the patient or authorized representative who has indicated his/her understanding and acceptance.     Interpreter used for [employer] Discussed with: CRNA  Anesthesia Plan Comments:        Anesthesia Quick Evaluation

## 2023-03-18 ENCOUNTER — Other Ambulatory Visit: Payer: Self-pay

## 2023-03-18 ENCOUNTER — Encounter (HOSPITAL_COMMUNITY): Payer: Self-pay | Admitting: Obstetrics and Gynecology

## 2023-03-18 ENCOUNTER — Inpatient Hospital Stay (HOSPITAL_COMMUNITY)
Admission: AD | Admit: 2023-03-18 | Discharge: 2023-03-21 | DRG: 787 | Disposition: A | Discharge status: Home / Self Care | Payer: Medicaid Other | Attending: Obstetrics and Gynecology | Admitting: Obstetrics and Gynecology

## 2023-03-18 ENCOUNTER — Inpatient Hospital Stay (HOSPITAL_COMMUNITY): Payer: Self-pay | Admitting: Anesthesiology

## 2023-03-18 ENCOUNTER — Inpatient Hospital Stay (HOSPITAL_COMMUNITY): Payer: Medicaid Other | Admitting: Anesthesiology

## 2023-03-18 ENCOUNTER — Encounter (HOSPITAL_COMMUNITY)
Admission: AD | Disposition: A | Discharge status: Home / Self Care | Payer: Self-pay | Source: Home / Self Care | Attending: Obstetrics and Gynecology

## 2023-03-18 DIAGNOSIS — O358XX0 Maternal care for other (suspected) fetal abnormality and damage, not applicable or unspecified: Secondary | ICD-10-CM | POA: Diagnosis present

## 2023-03-18 DIAGNOSIS — O3663X0 Maternal care for excessive fetal growth, third trimester, not applicable or unspecified: Secondary | ICD-10-CM | POA: Diagnosis present

## 2023-03-18 DIAGNOSIS — O3429 Maternal care due to uterine scar from other previous surgery: Secondary | ICD-10-CM

## 2023-03-18 DIAGNOSIS — Z3A38 38 weeks gestation of pregnancy: Secondary | ICD-10-CM

## 2023-03-18 DIAGNOSIS — Z23 Encounter for immunization: Secondary | ICD-10-CM

## 2023-03-18 DIAGNOSIS — O24425 Gestational diabetes mellitus in childbirth, controlled by oral hypoglycemic drugs: Secondary | ICD-10-CM | POA: Diagnosis present

## 2023-03-18 DIAGNOSIS — O9081 Anemia of the puerperium: Secondary | ICD-10-CM | POA: Diagnosis not present

## 2023-03-18 DIAGNOSIS — Z7982 Long term (current) use of aspirin: Secondary | ICD-10-CM

## 2023-03-18 DIAGNOSIS — O24429 Gestational diabetes mellitus in childbirth, unspecified control: Secondary | ICD-10-CM

## 2023-03-18 DIAGNOSIS — D62 Acute posthemorrhagic anemia: Secondary | ICD-10-CM | POA: Diagnosis not present

## 2023-03-18 DIAGNOSIS — Z8249 Family history of ischemic heart disease and other diseases of the circulatory system: Secondary | ICD-10-CM | POA: Diagnosis not present

## 2023-03-18 DIAGNOSIS — Z30430 Encounter for insertion of intrauterine contraceptive device: Secondary | ICD-10-CM

## 2023-03-18 DIAGNOSIS — Z833 Family history of diabetes mellitus: Secondary | ICD-10-CM

## 2023-03-18 DIAGNOSIS — O09299 Supervision of pregnancy with other poor reproductive or obstetric history, unspecified trimester: Principal | ICD-10-CM

## 2023-03-18 DIAGNOSIS — Z98891 History of uterine scar from previous surgery: Secondary | ICD-10-CM

## 2023-03-18 DIAGNOSIS — O2412 Pre-existing diabetes mellitus, type 2, in childbirth: Secondary | ICD-10-CM

## 2023-03-18 HISTORY — PX: INTRAUTERINE DEVICE (IUD) INSERTION: SHX5877

## 2023-03-18 LAB — TYPE AND SCREEN
ABO/RH(D): A POS
Antibody Screen: NEGATIVE

## 2023-03-18 LAB — CBC
HCT: 34.6 % — ABNORMAL LOW (ref 36.0–46.0)
Hemoglobin: 11.2 g/dL — ABNORMAL LOW (ref 12.0–15.0)
MCH: 27.7 pg (ref 26.0–34.0)
MCHC: 32.4 g/dL (ref 30.0–36.0)
MCV: 85.4 fL (ref 80.0–100.0)
Platelets: 238 10*3/uL (ref 150–400)
RBC: 4.05 MIL/uL (ref 3.87–5.11)
RDW: 14 % (ref 11.5–15.5)
WBC: 11.9 10*3/uL — ABNORMAL HIGH (ref 4.0–10.5)
nRBC: 0 % (ref 0.0–0.2)

## 2023-03-18 LAB — COMPREHENSIVE METABOLIC PANEL
ALT: 20 U/L (ref 0–44)
AST: 23 U/L (ref 15–41)
Albumin: 2.7 g/dL — ABNORMAL LOW (ref 3.5–5.0)
Alkaline Phosphatase: 124 U/L (ref 38–126)
Anion gap: 12 (ref 5–15)
BUN: 6 mg/dL (ref 6–20)
CO2: 19 mmol/L — ABNORMAL LOW (ref 22–32)
Calcium: 9.1 mg/dL (ref 8.9–10.3)
Chloride: 105 mmol/L (ref 98–111)
Creatinine, Ser: 0.56 mg/dL (ref 0.44–1.00)
GFR, Estimated: >60 mL/min (ref >60)
Glucose, Bld: 80 mg/dL (ref 70–99)
Potassium: 3.8 mmol/L (ref 3.5–5.1)
Sodium: 136 mmol/L (ref 135–145)
Total Bilirubin: 0.8 mg/dL (ref 0.3–1.2)
Total Protein: 7.2 g/dL (ref 6.5–8.1)

## 2023-03-18 LAB — RPR: RPR Ser Ql: NONREACTIVE

## 2023-03-18 LAB — GLUCOSE, CAPILLARY
Glucose-Capillary: 80 mg/dL (ref 70–99)
Glucose-Capillary: 66 mg/dL — ABNORMAL LOW (ref 70–99)
Glucose-Capillary: 74 mg/dL (ref 70–99)

## 2023-03-18 SURGERY — Surgical Case
Anesthesia: Spinal

## 2023-03-18 MED ORDER — DIBUCAINE (PERIANAL) 1 % EX OINT: 1.0000 | TOPICAL_OINTMENT | CUTANEOUS | Status: DC | PRN

## 2023-03-18 MED ORDER — NALOXONE HCL 0.4 MG/ML IJ SOLN: 0.4000 mg | INTRAMUSCULAR | Status: DC | PRN

## 2023-03-18 MED ORDER — KETAMINE HCL 50 MG/5ML IJ SOSY
PREFILLED_SYRINGE | INTRAMUSCULAR | Status: AC
  Filled 2023-03-18: qty 5

## 2023-03-18 MED ORDER — MIDAZOLAM HCL 5 MG/5ML IJ SOLN
INTRAMUSCULAR | Status: DC | PRN
  Administered 2023-03-18 (×2): 1 mg via INTRAVENOUS

## 2023-03-18 MED ORDER — DEXAMETHASONE SODIUM PHOSPHATE 4 MG/ML IJ SOLN
INTRAMUSCULAR | Status: AC
  Filled 2023-03-18: qty 1

## 2023-03-18 MED ORDER — DIPHENHYDRAMINE HCL 25 MG PO CAPS: 25.0000 mg | ORAL_CAPSULE | Freq: Four times a day (QID) | ORAL | Status: DC | PRN

## 2023-03-18 MED ORDER — OXYTOCIN-SODIUM CHLORIDE 30-0.9 UT/500ML-% IV SOLN
2.5000 [IU]/h | INTRAVENOUS | Status: AC
  Administered 2023-03-18: 2.5 [IU]/h via INTRAVENOUS

## 2023-03-18 MED ORDER — MORPHINE SULFATE (PF) 0.5 MG/ML IJ SOLN
INTRAMUSCULAR | Status: DC | PRN
  Administered 2023-03-18: .15 mg via INTRATHECAL

## 2023-03-18 MED ORDER — ONDANSETRON HCL 4 MG/2ML IJ SOLN
INTRAMUSCULAR | Status: DC | PRN
  Administered 2023-03-18: 4 mg via INTRAVENOUS

## 2023-03-18 MED ORDER — DIPHENHYDRAMINE HCL 50 MG/ML IJ SOLN: 12.5000 mg | INTRAMUSCULAR | Status: DC | PRN

## 2023-03-18 MED ORDER — MAGNESIUM HYDROXIDE 400 MG/5ML PO SUSP: 30.0000 mL | ORAL | Status: DC | PRN

## 2023-03-18 MED ORDER — LACTATED RINGERS IV SOLN: INTRAVENOUS | Status: DC

## 2023-03-18 MED ORDER — DROPERIDOL 2.5 MG/ML IJ SOLN
INTRAMUSCULAR | Status: AC
  Filled 2023-03-18: qty 2

## 2023-03-18 MED ORDER — STERILE WATER FOR IRRIGATION IR SOLN
Status: DC | PRN
  Administered 2023-03-18: 1

## 2023-03-18 MED ORDER — ACETAMINOPHEN 500 MG PO TABS
1000.0000 mg | ORAL_TABLET | Freq: Four times a day (QID) | ORAL | Status: DC
  Administered 2023-03-18 – 2023-03-21 (×11): 1000 mg via ORAL
  Filled 2023-03-18 (×11): qty 2

## 2023-03-18 MED ORDER — ONDANSETRON HCL 4 MG/2ML IJ SOLN
4.0000 mg | Freq: Three times a day (TID) | INTRAMUSCULAR | Status: DC | PRN
  Administered 2023-03-18: 4 mg via INTRAVENOUS

## 2023-03-18 MED ORDER — OXYTOCIN-SODIUM CHLORIDE 30-0.9 UT/500ML-% IV SOLN
INTRAVENOUS | Status: DC | PRN
  Administered 2023-03-18: 300 mL via INTRAVENOUS

## 2023-03-18 MED ORDER — SOD CITRATE-CITRIC ACID 500-334 MG/5ML PO SOLN
30.0000 mL | ORAL | Status: AC
  Administered 2023-03-18: 30 mL via ORAL

## 2023-03-18 MED ORDER — WITCH HAZEL-GLYCERIN EX PADS: 1.0000 | MEDICATED_PAD | CUTANEOUS | Status: DC | PRN

## 2023-03-18 MED ORDER — LEVONORGESTREL 20 MCG/DAY IU IUD
INTRAUTERINE_SYSTEM | INTRAUTERINE | Status: AC
  Filled 2023-03-18: qty 1

## 2023-03-18 MED ORDER — FENTANYL CITRATE (PF) 100 MCG/2ML IJ SOLN
INTRAMUSCULAR | Status: AC
  Filled 2023-03-18: qty 2

## 2023-03-18 MED ORDER — KETOROLAC TROMETHAMINE 30 MG/ML IJ SOLN
INTRAMUSCULAR | Status: AC
  Filled 2023-03-18: qty 1

## 2023-03-18 MED ORDER — DEXMEDETOMIDINE HCL IN NACL 80 MCG/20ML IV SOLN
INTRAVENOUS | Status: AC
  Filled 2023-03-18: qty 40

## 2023-03-18 MED ORDER — PHENYLEPHRINE HCL-NACL 20-0.9 MG/250ML-% IV SOLN
INTRAVENOUS | Status: DC | PRN
  Administered 2023-03-18: 60 ug/min via INTRAVENOUS

## 2023-03-18 MED ORDER — ONDANSETRON HCL 4 MG/2ML IJ SOLN
INTRAMUSCULAR | Status: AC
  Filled 2023-03-18: qty 2

## 2023-03-18 MED ORDER — CEFAZOLIN SODIUM-DEXTROSE 2-4 GM/100ML-% IV SOLN
2.0000 g | INTRAVENOUS | Status: AC
  Administered 2023-03-18: 2 g via INTRAVENOUS

## 2023-03-18 MED ORDER — BUPIVACAINE HCL (PF) 0.5 % IJ SOLN
INTRAMUSCULAR | Status: AC
  Filled 2023-03-18: qty 60

## 2023-03-18 MED ORDER — MIDAZOLAM HCL 2 MG/2ML IJ SOLN
INTRAMUSCULAR | Status: AC
  Filled 2023-03-18: qty 2

## 2023-03-18 MED ORDER — SENNOSIDES-DOCUSATE SODIUM 8.6-50 MG PO TABS
2.0000 | ORAL_TABLET | Freq: Every day | ORAL | Status: DC
  Administered 2023-03-19 – 2023-03-21 (×3): 2 via ORAL
  Filled 2023-03-18 (×3): qty 2

## 2023-03-18 MED ORDER — PRENATAL MULTIVITAMIN CH
1.0000 | ORAL_TABLET | Freq: Every day | ORAL | Status: DC
  Administered 2023-03-19 – 2023-03-21 (×3): 1 via ORAL
  Filled 2023-03-18 (×2): qty 1

## 2023-03-18 MED ORDER — KETOROLAC TROMETHAMINE 30 MG/ML IJ SOLN
30.0000 mg | Freq: Once | INTRAMUSCULAR | Status: AC
  Administered 2023-03-18: 30 mg via INTRAVENOUS

## 2023-03-18 MED ORDER — ACETAMINOPHEN 10 MG/ML IV SOLN
INTRAVENOUS | Status: DC | PRN
Start: 2023-03-18 — End: 2023-03-18
  Administered 2023-03-18: 1000 mg via INTRAVENOUS

## 2023-03-18 MED ORDER — SIMETHICONE 80 MG PO CHEW
80.0000 mg | CHEWABLE_TABLET | Freq: Three times a day (TID) | ORAL | Status: DC
  Administered 2023-03-19 – 2023-03-21 (×7): 80 mg via ORAL
  Filled 2023-03-18 (×7): qty 1

## 2023-03-18 MED ORDER — DEXAMETHASONE SODIUM PHOSPHATE 10 MG/ML IJ SOLN
INTRAMUSCULAR | Status: DC | PRN
  Administered 2023-03-18: 4 mg via INTRAVENOUS

## 2023-03-18 MED ORDER — NALOXONE HCL 4 MG/10ML IJ SOLN: 1.0000 ug/kg/h | INTRAVENOUS | Status: DC | PRN

## 2023-03-18 MED ORDER — BUPIVACAINE IN DEXTROSE 0.75-8.25 % IT SOLN
INTRATHECAL | Status: DC | PRN
  Administered 2023-03-18: 1.6 mL via INTRATHECAL

## 2023-03-18 MED ORDER — KETOROLAC TROMETHAMINE 30 MG/ML IJ SOLN: 30.0000 mg | Freq: Four times a day (QID) | INTRAMUSCULAR | Status: DC | PRN

## 2023-03-18 MED ORDER — DIPHENHYDRAMINE HCL 25 MG PO CAPS: 25.0000 mg | ORAL_CAPSULE | ORAL | Status: DC | PRN

## 2023-03-18 MED ORDER — DROPERIDOL 2.5 MG/ML IJ SOLN
0.6250 mg | Freq: Once | INTRAMUSCULAR | Status: AC | PRN
  Administered 2023-03-18: 0.625 mg via INTRAVENOUS

## 2023-03-18 MED ORDER — SODIUM CHLORIDE 0.9% FLUSH: 3.0000 mL | INTRAVENOUS | Status: DC | PRN

## 2023-03-18 MED ORDER — ACETAMINOPHEN 500 MG PO TABS: 1000.0000 mg | ORAL_TABLET | Freq: Four times a day (QID) | ORAL | Status: DC

## 2023-03-18 MED ORDER — FENTANYL CITRATE (PF) 250 MCG/5ML IJ SOLN
INTRAMUSCULAR | Status: DC | PRN
Start: 2023-03-18 — End: 2023-03-18
  Administered 2023-03-18: 35 ug via INTRAVENOUS
  Administered 2023-03-18: 50 ug via INTRAVENOUS
  Administered 2023-03-18: 100 ug via INTRAVENOUS

## 2023-03-18 MED ORDER — OXYCODONE HCL 5 MG PO TABS
5.0000 mg | ORAL_TABLET | Freq: Four times a day (QID) | ORAL | Status: DC | PRN
  Administered 2023-03-20: 5 mg via ORAL
  Filled 2023-03-18: qty 1

## 2023-03-18 MED ORDER — SOD CITRATE-CITRIC ACID 500-334 MG/5ML PO SOLN
ORAL | Status: AC
  Filled 2023-03-18: qty 30

## 2023-03-18 MED ORDER — POVIDONE-IODINE 10 % EX SWAB
2.0000 | Freq: Once | CUTANEOUS | Status: AC
  Administered 2023-03-18: 2 via TOPICAL

## 2023-03-18 MED ORDER — FENTANYL CITRATE (PF) 100 MCG/2ML IJ SOLN: 25.0000 ug | INTRAMUSCULAR | Status: DC | PRN

## 2023-03-18 MED ORDER — KETOROLAC TROMETHAMINE 30 MG/ML IJ SOLN
30.0000 mg | Freq: Four times a day (QID) | INTRAMUSCULAR | Status: AC
  Administered 2023-03-18 – 2023-03-19 (×2): 30 mg via INTRAVENOUS
  Filled 2023-03-18 (×3): qty 1

## 2023-03-18 MED ORDER — CEFAZOLIN SODIUM-DEXTROSE 2-4 GM/100ML-% IV SOLN
INTRAVENOUS | Status: AC
  Filled 2023-03-18: qty 100

## 2023-03-18 MED ORDER — COCONUT OIL OIL: 1.0000 | TOPICAL_OIL | Status: DC | PRN

## 2023-03-18 MED ORDER — FENTANYL CITRATE (PF) 100 MCG/2ML IJ SOLN
INTRAMUSCULAR | Status: DC | PRN
  Administered 2023-03-18: 15 ug via INTRATHECAL

## 2023-03-18 MED ORDER — MORPHINE SULFATE (PF) 0.5 MG/ML IJ SOLN
INTRAMUSCULAR | Status: AC
  Filled 2023-03-18: qty 10

## 2023-03-18 MED ORDER — TETANUS-DIPHTH-ACELL PERTUSSIS 5-2.5-18.5 LF-MCG/0.5 IM SUSY: 0.5000 mL | PREFILLED_SYRINGE | Freq: Once | INTRAMUSCULAR | Status: DC

## 2023-03-18 MED ORDER — BUPIVACAINE HCL (PF) 0.5 % IJ SOLN
INTRAMUSCULAR | Status: DC | PRN
  Administered 2023-03-18: 25 mL

## 2023-03-18 MED ORDER — PANTOPRAZOLE SODIUM 40 MG PO TBEC
40.0000 mg | DELAYED_RELEASE_TABLET | Freq: Every day | ORAL | Status: DC
  Administered 2023-03-18 – 2023-03-21 (×4): 40 mg via ORAL
  Filled 2023-03-18 (×4): qty 1

## 2023-03-18 MED ORDER — MENTHOL 3 MG MT LOZG: 1.0000 | LOZENGE | OROMUCOSAL | Status: DC | PRN

## 2023-03-18 MED ORDER — IBUPROFEN 600 MG PO TABS
600.0000 mg | ORAL_TABLET | Freq: Four times a day (QID) | ORAL | Status: DC
  Administered 2023-03-19 – 2023-03-21 (×9): 600 mg via ORAL
  Filled 2023-03-18 (×9): qty 1

## 2023-03-18 MED ORDER — SIMETHICONE 80 MG PO CHEW: 80.0000 mg | CHEWABLE_TABLET | ORAL | Status: DC | PRN

## 2023-03-18 MED ORDER — KETAMINE HCL 10 MG/ML IJ SOLN
INTRAMUSCULAR | Status: DC | PRN
  Administered 2023-03-18 (×2): 10 mg via INTRAVENOUS

## 2023-03-18 MED ORDER — GABAPENTIN 100 MG PO CAPS
200.0000 mg | ORAL_CAPSULE | Freq: Every day | ORAL | Status: DC
  Administered 2023-03-18 – 2023-03-20 (×3): 200 mg via ORAL
  Filled 2023-03-18 (×3): qty 2

## 2023-03-18 MED ORDER — DEXMEDETOMIDINE HCL IN NACL 80 MCG/20ML IV SOLN
INTRAVENOUS | Status: DC | PRN
  Administered 2023-03-18 (×2): 8 ug via INTRAVENOUS
  Administered 2023-03-18: 4 ug via INTRAVENOUS
  Administered 2023-03-18: 8 ug via INTRAVENOUS

## 2023-03-18 MED ORDER — LEVONORGESTREL 20 MCG/DAY IU IUD
1.0000 | INTRAUTERINE_SYSTEM | Freq: Once | INTRAUTERINE | Status: AC
  Administered 2023-03-18: 1 via INTRAUTERINE

## 2023-03-18 SURGICAL SUPPLY — 40 items
ADH SKN CLS APL DERMABOND .7 (GAUZE/BANDAGES/DRESSINGS) ×2
APL PRP STRL LF DISP 70% ISPRP (MISCELLANEOUS) ×2
APL SKNCLS STERI-STRIP NONHPOA (GAUZE/BANDAGES/DRESSINGS) ×1
BENZOIN TINCTURE PRP APPL 2/3 (GAUZE/BANDAGES/DRESSINGS) IMPLANT
CHLORAPREP W/TINT 26 (MISCELLANEOUS) ×2 IMPLANT
CLAMP UMBILICAL CORD (MISCELLANEOUS) ×1 IMPLANT
CLOTH BEACON ORANGE TIMEOUT ST (SAFETY) ×1 IMPLANT
DERMABOND ADVANCED .7 DNX12 (GAUZE/BANDAGES/DRESSINGS) ×2 IMPLANT
DRSG OPSITE POSTOP 4X10 (GAUZE/BANDAGES/DRESSINGS) ×1 IMPLANT
ELECT REM PT RETURN 9FT ADLT (ELECTROSURGICAL) ×1
ELECTRODE REM PT RTRN 9FT ADLT (ELECTROSURGICAL) ×1 IMPLANT
EXTRACTOR VACUUM M CUP 4 TUBE (SUCTIONS) IMPLANT
GLOVE BIOGEL PI IND STRL 7.0 (GLOVE) ×2 IMPLANT
GLOVE BIOGEL PI IND STRL 7.5 (GLOVE) ×2 IMPLANT
GLOVE ECLIPSE 7.5 STRL STRAW (GLOVE) ×1 IMPLANT
GOWN STRL REUS W/TWL LRG LVL3 (GOWN DISPOSABLE) ×3 IMPLANT
HEMOSTAT ARISTA ABSORB 3G PWDR (HEMOSTASIS) IMPLANT
KIT ABG SYR 3ML LUER SLIP (SYRINGE) IMPLANT
NDL HYPO 25X5/8 SAFETYGLIDE (NEEDLE) IMPLANT
NEEDLE HYPO 25X5/8 SAFETYGLIDE (NEEDLE)
NS IRRIG 1000ML POUR BTL (IV SOLUTION) ×1 IMPLANT
PACK C SECTION WH (CUSTOM PROCEDURE TRAY) ×1 IMPLANT
PAD ABD DERMACEA PRESS 5X9 (GAUZE/BANDAGES/DRESSINGS) IMPLANT
PAD OB MATERNITY 4.3X12.25 (PERSONAL CARE ITEMS) ×1 IMPLANT
RTRCTR C-SECT PINK 25CM LRG (MISCELLANEOUS) ×1 IMPLANT
STRIP CLOSURE SKIN 1/2X4 (GAUZE/BANDAGES/DRESSINGS) IMPLANT
SUT MNCRL 0 VIOLET CTX 36 (SUTURE) ×2 IMPLANT
SUT MNCRL AB 3-0 PS2 27 (SUTURE) ×1 IMPLANT
SUT MNCRL AB 4-0 PS2 18 (SUTURE) ×1 IMPLANT
SUT VIC AB 0 CT1 27 (SUTURE) ×1
SUT VIC AB 0 CT1 27XBRD ANBCTR (SUTURE) ×1 IMPLANT
SUT VIC AB 0 CTX 36 (SUTURE) ×1
SUT VIC AB 0 CTX36XBRD ANBCTRL (SUTURE) ×1 IMPLANT
SUT VIC AB 2-0 CT1 27 (SUTURE) ×1
SUT VIC AB 2-0 CT1 TAPERPNT 27 (SUTURE) ×1 IMPLANT
SUT VIC AB 4-0 PS2 27 (SUTURE) ×1 IMPLANT
TOWEL OR 17X24 6PK STRL BLUE (TOWEL DISPOSABLE) ×1 IMPLANT
TRAY FOLEY W/BAG SLVR 14FR LF (SET/KITS/TRAYS/PACK) ×1 IMPLANT
WATER STERILE IRR 1000ML POUR (IV SOLUTION) ×1 IMPLANT
mirena ×1 IMPLANT

## 2023-03-18 NOTE — Anesthesia Postprocedure Evaluation (Signed)
Anesthesia Post Note  Patient: Kelsey Mcdaniel  Procedure(s) Performed: CESAREAN SECTION INTRAUTERINE DEVICE (IUD) INSERTION     Patient location during evaluation: PACU Anesthesia Type: Spinal Level of consciousness: awake and alert Pain management: pain level controlled Vital Signs Assessment: post-procedure vital signs reviewed and stable Respiratory status: spontaneous breathing, nonlabored ventilation and respiratory function stable Cardiovascular status: blood pressure returned to baseline Postop Assessment: no apparent nausea or vomiting, spinal receding, no headache and no backache Anesthetic complications: no   No notable events documented.  Last Vitals:  Vitals:   03/18/23 1530 03/18/23 1543  BP: (!) 103/51 111/62  Pulse: 89 73  Resp: 20 20  Temp:  36.8 C  SpO2:      Last Pain:  Vitals:   03/18/23 1543  TempSrc: Oral  PainSc: 0-No pain   Pain Goal: Patients Stated Pain Goal: 4 (03/18/23 1515)                 Shanda Howells

## 2023-03-18 NOTE — Op Note (Addendum)
Kelsey Mcdaniel PROCEDURE DATE: 03/18/2023  PREOPERATIVE DIAGNOSES: Intrauterine pregnancy at 100w0d weeks gestation;  history of fourth degree perineal laceration  POSTOPERATIVE DIAGNOSES: The same  PROCEDURE: Primary Low Transverse Cesarean Section, Insertion of Mirena IUD  SURGEON:  Dr. Harvie Bridge  ASSISTANT:  Sundra Aland, MD  ANESTHESIOLOGY TEAM: Anesthesiologist: Kaylyn Layer, MD CRNA: Algis Greenhouse, CRNA  INDICATIONS: Kelsey Mcdaniel is a 27 y.o. [phone] at [redacted]w[redacted]d here for cesarean section secondary to the indications listed under preoperative diagnoses; please see preoperative note for further details.  The risks of surgery were discussed with the patient including but were not limited to: bleeding which may require transfusion or reoperation; infection which may require antibiotics; injury to bowel, bladder, ureters or other surrounding organs; injury to the fetus; need for additional procedures including hysterectomy in the event of a life-threatening hemorrhage; formation of adhesions; placental abnormalities wth subsequent pregnancies; incisional problems; thromboembolic phenomenon and other postoperative/anesthesia complications.  The patient concurred with the proposed plan, giving informed written consent for the procedure.    FINDINGS:  Viable female infant in cephalic presentation.  Apgars 9 and 9.  Amniotic fluid: clear.  Intact placenta, three vessel cord.  Normal uterus, fallopian tubes and ovaries bilaterally.  ANESTHESIA: spinal INTRAVENOUS FLUIDS: 2300 ml   ESTIMATED BLOOD LOSS: 1179 ml URINE OUTPUT:  200 ml SPECIMENS: Placenta sent to L&D  COMPLICATIONS: None immediate  PROCEDURE IN DETAIL:  The patient preoperatively received intravenous antibiotics and had sequential compression devices applied to her lower extremities.  She was then taken to the operating room where spinal anesthesia was found to be adequate. She was then placed in a dorsal supine position  with a leftward tilt, and prepped and draped in a sterile manner.  A foley catheter was  placed into her bladder and attached to constant gravity.  After an adequate timeout was performed, 10cc Marcaine 0.5% was injected into the subcutaneous tissue along proposed incision site. A Pfannenstiel skin incision was made with scalpel and carried through to the underlying layer of fascia. The fascia was incised in the midline, and this incision was extended bluntly. The rectus muscles were separated in the midline and the peritoneum was entered bluntly.   The Alexis self-retaining retractor was introduced into the abdominal cavity.  Attention was turned to the lower uterine segment where a low transverse hysterotomy was made with a scalpel and extended bilaterally bluntly.  The infant was successfully delivered, the cord was clamped and cut after one minute, and the infant was handed over to the awaiting neonatology team. Uterine massage was then administered, and the placenta delivered intact with a three-vessel cord. The uterus was then cleared of clots and debris.    The hysterotomy was closed with 0 Vicryl in a running fashion. At halfway through closure, the Mirena IUD was inserted manually. After confirming appropriate placement at the fundus, the IUD strings were trimmed and a [name] was used to guide them towards the cervix. The hysterotomy closure was then completed. An imbricating layer was placed using 0 Vicryl to achieve hemostasis. The pelvis was cleared of all clot and debris. After using Bovie electrocautery to cauterize oozing serosal edges, Arista was applied. Hemostasis was excellent. The retractor was removed. An additional 10cc of 0.5% Marcaine was injected into the subcutaneous tissue to assist with pain control. The peritoneum was closed with a 2-0 Vicryl running stitch. The fascia was then closed using 0 Vicryl in a running fashion.  The subcutaneous layer was irrigated, and  was found to be  hemostatic and any areas of bleeding were cauterized with the bovie. The skin was closed with a 4-0 monocryl subcuticular stitch. 15cc of Marcaine 0.5% were injected along the incision closure. The patient tolerated the procedure well. Sponge, instrument and needle counts were correct x 3.  She was taken to the recovery room in stable condition.   Harvie Bridge, MD Obstetrician & Gynecologist, [facility] for [employer], [facility]

## 2023-03-18 NOTE — Anesthesia Procedure Notes (Signed)
Spinal  Patient location during procedure: OR Start time: 03/18/2023 12:08 PM End time: 03/18/2023 12:11 PM Reason for block: surgical anesthesia Staffing Performed: anesthesiologist  Anesthesiologist: Kaylyn Layer, MD Performed by: Kaylyn Layer, MD Authorized by: Kaylyn Layer, MD   Preanesthetic Checklist Completed: patient identified, IV checked, risks and benefits discussed, monitors and equipment checked, pre-op evaluation and timeout performed Spinal Block Patient position: sitting Prep: DuraPrep and site prepped and draped Patient monitoring: heart rate, continuous pulse ox and blood pressure Approach: midline Location: L3-4 Injection technique: single-shot Needle Needle type: Pencan  Needle gauge: 24 G Needle length: 10 cm Assessment Sensory level: T4 Events: CSF return Additional Notes Risks, benefits, and alternative discussed. Patient gave consent to procedure. Prepped and draped in sitting position. Clear CSF obtained after one needle pass. Positive terminal aspiration. No pain or paraesthesias with injection. Patient tolerated procedure well. Vital signs stable. Amalia Greenhouse, MD

## 2023-03-18 NOTE — Discharge Summary (Signed)
Postpartum Discharge Summary  Date of Service updated***     Patient Name: Kelsey Mcdaniel DOB: 07/23/1995 MRN: [id number]  Date of admission: 03/18/2023 Delivery date:03/18/2023 Delivering provider: Lennart Pall Date of discharge: 03/18/2023  Admitting diagnosis: Previous fourth degree perineal laceration. Intrauterine pregnancy: [redacted]w[redacted]d     Secondary diagnosis:  Principal Problem:   S/P cesarean section Active Problems:   Encounter for insertion of mirena IUD  Additional problems: ***    Discharge diagnosis: Term Pregnancy Delivered, Type 2 DM, and PPH                                              Post partum procedures:{Postpartum procedures:23558} Augmentation: N/A Complications: Hemorrhage>109mL  Hospital course: Sceduled C/S   27 y.o. yo G3P3003 at [redacted]w[redacted]d was admitted to the hospital 03/18/2023 for scheduled cesarean section with the following indication: history of fourth degree perineal laceration .Delivery details are as follows:  Membrane Rupture Time/Date:  ,   Delivery Method:C-Section, Low Transverse Operative Delivery:N/A Details of operation can be found in separate operative note.  Patient had a postpartum course complicated by***.  She is ambulating, tolerating a regular diet, passing flatus, and urinating well. Patient is discharged home in stable condition on  03/18/23        Newborn Data: Birth date:03/18/2023 Birth time:12:48 PM Gender:Female Living status:Living Apgars:9 ,9  Weight:3470 g    Magnesium Sulfate received: No BMZ received: No Rhophylac:N/A MMR:N/A T-DaP:*** Flu: N/A RSV Vaccine received: No Transfusion:{Transfusion received:30440034}  Immunizations received: Immunization History  Administered Date(s) Administered   Hepatitis A, Adult 07/31/2021   Hepatitis B, ADULT 07/31/2021   Influenza,inj,Quad PF,6+ Mos 07/31/2021   Influenza-Unspecified 04/07/2020   Janssen (J&J) SARS-COV-2 Vaccination 03/18/2020   MMR 07/31/2021    Moderna SARS-COV2 Booster Vaccination 06/29/2020   Tdap 07/22/2020    Physical exam  Vitals:   03/18/23 0929 03/18/23 0958 03/18/23 1003  BP: 113/81    Pulse: 99    Resp: 16    Temp: 98.1 F (36.7 C)  98.1 F (36.7 C)  TempSrc: Oral Oral Oral  SpO2: 99%    Weight:  83.5 kg 83.5 kg  Height:  5\' 4"  (1.626 m) 5\' 4"  (1.626 m)   General: {Exam; general:21111117} Lochia: {Desc; appropriate/inappropriate:30686::"appropriate"} Uterine Fundus: {Desc; firm/soft:30687} Incision: {Exam; incision:21111123} DVT Evaluation: {Exam; dvt:2111122} Labs: Lab Results  Component Value Date   WBC 11.9 (H) 03/18/2023   HGB 11.2 (L) 03/18/2023   HCT 34.6 (L) 03/18/2023   MCV 85.4 03/18/2023   PLT 238 03/18/2023      Latest Ref Rng & Units 03/18/2023    9:43 AM  CMP  Glucose 70 - 99 mg/dL 80   BUN 6 - 20 mg/dL 6   Creatinine 4.09 - 8.11 mg/dL 9.14   Sodium 782 - 956 mmol/L 136   Potassium 3.5 - 5.1 mmol/L 3.8   Chloride 98 - 111 mmol/L 105   CO2 22 - 32 mmol/L 19   Calcium 8.9 - 10.3 mg/dL 9.1   Total Protein 6.5 - 8.1 g/dL 7.2   Total Bilirubin 0.3 - 1.2 mg/dL 0.8   Alkaline Phos 38 - 126 U/L 124   AST 15 - 41 U/L 23   ALT 0 - 44 U/L 20    Edinburgh Score:    11/16/2020    2:53 PM  Edinburgh Postnatal Depression Scale  Screening Tool  I have been able to laugh and see the funny side of things. 0  I have looked forward with enjoyment to things. 0  I have blamed myself unnecessarily when things went wrong. 0  I have been anxious or worried for no good reason. 0  I have felt scared or panicky for no good reason. 0  Things have been getting on top of me. 0  I have been so unhappy that I have had difficulty sleeping. 0  I have felt sad or miserable. 0  I have been so unhappy that I have been crying. 0  The thought of harming myself has occurred to me. 0  Edinburgh Postnatal Depression Scale Total 0   No data recorded  After visit meds:  Allergies as of 03/18/2023       Reactions    Pork-derived Products Other (See Comments)   Religious      Med Rec must be completed prior to using this [facility]***        Discharge home in stable condition Infant Feeding: Breast Infant Disposition:{CHL IP OB HOME WITH WUJWJX:91478} Discharge instruction: per After Visit Summary and Postpartum booklet. Activity: Advance as tolerated. Pelvic rest for 6 weeks.  Diet: {OB GNFA:21308657} Future Appointments:No future appointments. Follow up Visit: [facility]  Please schedule this patient for a In person postpartum visit in 6 weeks with the following provider: Any provider. Additional Postpartum F/U:2 hour GTT and Incision check 1 week  High risk pregnancy complicated by: GDM Delivery mode:  C-Section, Low Transverse Anticipated Birth Control:  PP IUD placed   03/18/2023 Sundra Aland, MD

## 2023-03-18 NOTE — H&P (Signed)
Obstetric Preoperative History and Physical  Kelsey Mcdaniel is a 27 y.o. [id number] with IUP at [redacted]w[redacted]d presenting for scheduled cesarean section.  Reports good fetal movement, no bleeding, no contractions, no leaking of fluid.  No acute preoperative concerns.    Cesarean Section Indication:  history of fourth degree perineal laceration  Prenatal Course Source of Care: [facility] with onset of care at 10 weeks -- care initiated at [facility], transferred to [facility] at 27 weeks Pregnancy complications or risks: Patient Active Problem List   Diagnosis Date Noted   Abnormal genetic test during pregnancy 01/13/2023   Gestational diabetes mellitus (GDM) affecting pregnancy, antepartum 01/01/2023   H/O maternal fourth degree perineal laceration, currently pregnant 01/01/2023   Abnormal glucose tolerance affecting pregnancy, antepartum 11/09/2022   History of gestational hypertension 06/30/2020   She plans to breastfeed She desires IUD for postpartum contraception.   Prenatal labs and studies: ABO, Rh: --/--/A POS (09/23 4696) Antibody: NEG (09/23 0944) Rubella: 23.20 (02/23 1554) RPR: Non Reactive (02/23 1554)  HBsAg: Negative (02/23 1554)  HIV: Non Reactive (02/23 1554)  GBS:  2 hr Glucola: N/A -- pregestational DM Genetic screening: abnormal -- atypical cfDNA with no further characterization Anatomy US abnormal with bilateral UTD  - resolved on subsequent imaging  Prenatal Transfer Tool  Maternal Diabetes: Yes:  Diabetes Type:  Pre-pregnancy Genetic Screening: Abnormal:  Results: Other: atypical finding, not further characterized Maternal Ultrasounds/Referrals: Fetal Kidney Anomalies Fetal Ultrasounds or other Referrals:  Fetal echo ordered, unable to view results, MFM referral, genetics referral due to abnormal NIPT Maternal Substance Abuse:  No Significant Maternal Medications:  Meds include: Other: Metformin, Protonix Significant Maternal Lab Results: GBS pending  Past Medical History:   Diagnosis Date   Cloacal malformation    Constipation 01/13/2021   GERD (gastroesophageal reflux disease) 11/09/2022   Gestational diabetes    History of pregnancy induced hypertension    Supervision of normal pregnancy 08/17/2022              Nursing Staff    Provider      Office Location     [facility]     Dating      LMP = 14 wk u/s      Language     Pashto    Anatomy US      Bilateral pyelectasis, rpt scan on 7/22      Flu Vaccine          Genetic Screen     Declined genetic screen      TDaP vaccine          Hgb A1C or   GTT    Early ABNORMAL 197  Third trimester       Rhogam     N/a         LAB RESULTS       Feeding Plan    Bre    Past Surgical History:  Procedure Laterality Date   PERINEOPLASTY N/A 04/10/2021   Procedure: PERINEOPLASTY;  Surgeon: Marguerita Beards, MD;  Location: [facility];  Service: Gynecology;  Laterality: N/A;   PERINEOPLASTY N/A 04/27/2021   Procedure: PERINEOPLASTY;  Surgeon: Marguerita Beards, MD;  Location: [facility];  Service: Gynecology;  Laterality: N/A;  total time requested is 1 hour   RECTOVAGINAL FISTULA CLOSURE  2021    OB History  Gravida Para Term Preterm AB Living  3 2 2  0 0 2  SAB IAB Ectopic Multiple Live Births  0 2    # Outcome Date GA Lbr Len/2nd Weight Sex Type Anes PTL Lv  3 Current           2 Term 10/11/20 [redacted]w[redacted]d 01:20 / 00:07 3311 g F Vag-Spont None  LIV     Birth Comments: None     Complications: Hx of maternal laceration, 4th degree, currently pregnant, Gestational hypertension  1 Term 04/19/18   5443 g F Vag-Breech None N LIV     Complications: History of episiotomy    Social History   Socioeconomic History   Marital status: Married    Spouse name: Not on file   Number of children: Not on file   Years of education: Not on file   Highest education level: Not on file  Occupational History   Not on file  Tobacco Use   Smoking status: Never    Passive exposure: Never   Smokeless  tobacco: Never  Vaping Use   Vaping status: Never Used  Substance and Sexual Activity   Alcohol use: Never   Drug use: Never   Sexual activity: Not Currently  Other Topics Concern   Not on file  Social History Narrative   Not on file   Social Determinants of Health   Financial Resource Strain: Not on file  Food Insecurity: No Food Insecurity (03/18/2023)   Hunger Vital Sign    Worried About Running Out of Food in the Last Year: Never true    Ran Out of Food in the Last Year: Never true  Transportation Needs: No Transportation Needs (03/18/2023)   PRAPARE - Administrator, Civil Service (Medical): No    Lack of Transportation (Non-Medical): No  Physical Activity: Not on file  Stress: Not on file  Social Connections: Not on file    Family History  Problem Relation Age of Onset   Diabetes Mother    Heart disease Mother    Diabetes Father    Heart disease Father    Healthy Sister    Healthy Sister    Healthy Sister    Healthy Sister    Healthy Brother    Healthy Brother    Healthy Brother    Healthy Daughter    Healthy Daughter     Medications Prior to Admission  Medication Sig Dispense Refill Last Dose   acetaminophen (TYLENOL) 325 MG tablet Take 2 tablets (650 mg total) by mouth every 6 (six) hours as needed for moderate pain. 60 tablet 0    aspirin EC 81 MG tablet Take 1 tablet (81 mg total) by mouth daily. Swallow whole. 90 tablet 1    cyclobenzaprine (FLEXERIL) 10 MG tablet Take 1 tablet (10 mg total) by mouth 3 (three) times daily as needed for muscle spasms. 30 tablet 2    pantoprazole (PROTONIX) 40 MG tablet Take 1 tablet (40 mg total) by mouth daily. 30 tablet 2    polyethylene glycol (MIRALAX / GLYCOLAX) 17 g packet Take 17 g by mouth daily as needed for moderate constipation.      Prenatal Vit-Fe Fumarate-FA (PRENATAL VITAMIN PLUS LOW IRON) 27-1 MG TABS Take 1 tablet by mouth daily 90 tablet 1    Accu-Chek Softclix Lancets lancets TEST FOUR TIMES  DAILY 100 each 5    Accu-Chek Softclix Lancets lancets Use 4 times daily 100 each 12    Blood Glucose Monitoring Suppl DEVI Use to check blood sugar up to 4 times daily. May substitute to any manufacturer covered by patient's insurance.  1 each 0    famotidine (PEPCID) 10 MG tablet Take 1 tablet (10 mg total) by mouth 2 (two) times daily as needed for heartburn or indigestion. (Patient not taking: Reported on 03/07/2023) 60 tablet 3 Not Taking   fluticasone (FLONASE) 50 MCG/ACT nasal spray Place 2 sprays into both nostrils daily. (Patient taking differently: Place 2 sprays into both nostrils daily as needed for allergies.) 16 g 6    glucose blood (ACCU-CHEK GUIDE) test strip 100 each by Other route 4 (four) times daily. for testing 100 strip 0    Lancets Misc. MISC Use to check blood sugar up to 4 times daily. May substitute to any manufacturer covered by patient's insurance. 100 each 0    metFORMIN (GLUCOPHAGE-XR) 500 MG 24 hr tablet Take 2 tablets (1,000 mg total) by mouth 2 (two) times daily. 180 tablet 0     No Known Allergies  Review of Systems: Pertinent items noted in HPI and remainder of comprehensive ROS otherwise negative.  Physical Exam: BP 113/81   Pulse 99   Temp 98.1 F (36.7 C) (Oral)   Resp 16   Ht 5\' 4"  (1.626 m)   Wt 83.5 kg   LMP 06/25/2022 (Exact Date)   SpO2 99%   BMI 31.58 kg/m  FHR by Doppler: 148 bpm CONSTITUTIONAL: Well-developed, well-nourished female in no acute distress.  HENT:  Normocephalic, atraumatic, External right and left ear normal. Oropharynx is clear and moist EYES: Conjunctivae and EOM are normal. Pupils are equal, round, and reactive to light. No scleral icterus.  NECK: Normal range of motion, supple, no masses SKIN: Skin is warm and dry. No rash noted. Not diaphoretic. No erythema. No pallor. NEUROLOGIC: Alert and oriented to person, place, and time. Normal reflexes, muscle tone coordination. No cranial nerve deficit noted. PSYCHIATRIC: Normal  mood and affect. Normal behavior. Normal judgment and thought content. CARDIOVASCULAR: Normal heart rate noted, regular rhythm RESPIRATORY: Effort and breath sounds normal, no problems with respiration noted ABDOMEN: Soft, nontender, nondistended, gravid. PELVIC: Deferred MUSCULOSKELETAL: Normal range of motion. No edema and no tenderness. 2+ distal pulses.  Pertinent Labs/Studies:   Results for orders placed or performed during the hospital encounter of 03/18/23 (from the past 72 hour(s))  Glucose, capillary     Status: None   Collection Time: 03/18/23  9:41 AM  Result Value Ref Range   Glucose-Capillary 80 70 - 99 mg/dL    Comment: Glucose reference range applies only to samples taken after fasting for at least 8 hours.  CBC     Status: Abnormal   Collection Time: 03/18/23  9:43 AM  Result Value Ref Range   WBC 11.9 (H) 4.0 - 10.5 K/uL   RBC 4.05 3.87 - 5.11 MIL/uL   Hemoglobin 11.2 (L) 12.0 - 15.0 g/dL   HCT 72.5 (L) 36.6 - 44.0 %   MCV 85.4 80.0 - 100.0 fL   MCH 27.7 26.0 - 34.0 pg   MCHC 32.4 30.0 - 36.0 g/dL   RDW 34.7 42.5 - 95.6 %   Platelets 238 150 - 400 K/uL   nRBC 0.0 0.0 - 0.2 %    Comment: Performed at [facility], [address] [place], [place] [address]  Comprehensive metabolic panel     Status: Abnormal   Collection Time: 03/18/23  9:43 AM  Result Value Ref Range   Sodium 136 135 - 145 mmol/L   Potassium 3.8 3.5 - 5.1 mmol/L   Chloride 105 98 - 111 mmol/L  CO2 19 (L) 22 - 32 mmol/L   Glucose, Bld 80 70 - 99 mg/dL    Comment: Glucose reference range applies only to samples taken after fasting for at least 8 hours.   BUN 6 6 - 20 mg/dL   Creatinine, Ser 4.09 0.44 - 1.00 mg/dL   Calcium 9.1 8.9 - 81.1 mg/dL   Total Protein 7.2 6.5 - 8.1 g/dL   Albumin 2.7 (L) 3.5 - 5.0 g/dL   AST 23 15 - 41 U/L   ALT 20 0 - 44 U/L   Alkaline Phosphatase 124 38 - 126 U/L   Total Bilirubin 0.8 0.3 - 1.2 mg/dL   GFR, Estimated >91 >47 mL/min    Comment:  (NOTE) Calculated using the CKD-EPI Creatinine Equation (2021)    Anion gap 12 5 - 15    Comment: Performed at [facility], [address], [place], [place] [address]  Type and screen [facility]     Status: None   Collection Time: 03/18/23  9:44 AM  Result Value Ref Range   ABO/RH(D) A POS    Antibody Screen NEG    Sample Expiration      03/21/2023,2359 Performed at [facility], [address], [place], [place] [address]     Assessment and Plan: Kelsey Mcdaniel is a 27 y.o. G3P2002 at [redacted]w[redacted]d being admitted for scheduled cesarean section. The risks of surgery were discussed with the patient including but were not limited to: bleeding which may require transfusion or reoperation; infection which may require antibiotics; injury to bowel, bladder, ureters or other surrounding organs; injury to the fetus; need for additional procedures including hysterectomy in the event of a life-threatening hemorrhage; formation of adhesions; placental abnormalities wth subsequent pregnancies; incisional problems; thromboembolic phenomenon and other postoperative/anesthesia complications. The patient concurred with the proposed plan, giving informed written consent for the procedure. Patient has been NPO since last night she will remain NPO for procedure. Anesthesia and OR aware. Preoperative prophylactic antibiotics and SCDs ordered on call to the OR. To OR when ready.   Discussed placement of Mirena intraoperatively.  Sundra Aland, MD FMOB Fellow, Faculty practice [facility], Center for [facility] 03/18/23  11:08 AM

## 2023-03-18 NOTE — Transfer of Care (Signed)
Immediate Anesthesia Transfer of Care Note  Patient: Kelsey Mcdaniel  Procedure(s) Performed: CESAREAN SECTION INTRAUTERINE DEVICE (IUD) INSERTION  Patient Location: PACU  Anesthesia Type:Spinal  Level of Consciousness: awake  Airway & Oxygen Therapy: Patient Spontanous Breathing  Post-op Assessment: Report given to RN  Post vital signs: Reviewed and stable  Last Vitals:  Vitals Value Taken Time  BP    Temp    Pulse    Resp    SpO2      Last Pain:  Vitals:   03/18/23 1003  TempSrc: Oral  PainSc: 0-No pain         Complications: No notable events documented.

## 2023-03-18 NOTE — Lactation Note (Signed)
This note was copied from a baby's chart. Lactation Consultation Note Attempted to see mom. Mom sleeping. Asked FOB to call for Lactation when mom wakes up. He said OK.  Patient Name: Kelsey Mcdaniel [id number] Date: 03/18/2023 Age:27 hours     Maternal Data    Feeding    LATCH Score                    Lactation Tools Discussed/Used    Interventions    Discharge    Consult Status      [name] 03/18/2023, 10:43 PM

## 2023-03-19 LAB — CBC WITH DIFFERENTIAL/PLATELET
Abs Immature Granulocytes: 0.07 10*3/uL (ref 0.00–0.07)
Basophils Absolute: 0 10*3/uL (ref 0.0–0.1)
Basophils Relative: 0 %
Eosinophils Absolute: 0.1 10*3/uL (ref 0.0–0.5)
Eosinophils Relative: 1 %
HCT: 25.6 % — ABNORMAL LOW (ref 36.0–46.0)
Hemoglobin: 8.4 g/dL — ABNORMAL LOW (ref 12.0–15.0)
Immature Granulocytes: 1 %
Lymphocytes Relative: 19 %
Lymphs Abs: 1.9 10*3/uL (ref 0.7–4.0)
MCH: 27.9 pg (ref 26.0–34.0)
MCHC: 32.8 g/dL (ref 30.0–36.0)
MCV: 85 fL (ref 80.0–100.0)
Monocytes Absolute: 0.8 10*3/uL (ref 0.1–1.0)
Monocytes Relative: 8 %
Neutro Abs: 7.1 10*3/uL (ref 1.7–7.7)
Neutrophils Relative %: 71 %
Platelets: 204 10*3/uL (ref 150–400)
RBC: 3.01 MIL/uL — ABNORMAL LOW (ref 3.87–5.11)
RDW: 14.2 % (ref 11.5–15.5)
WBC: 10 10*3/uL (ref 4.0–10.5)
nRBC: 0 % (ref 0.0–0.2)

## 2023-03-19 LAB — CBC
HCT: 24.4 % — ABNORMAL LOW (ref 36.0–46.0)
Hemoglobin: 8.1 g/dL — ABNORMAL LOW (ref 12.0–15.0)
MCH: 28.2 pg (ref 26.0–34.0)
MCHC: 33.2 g/dL (ref 30.0–36.0)
MCV: 85 fL (ref 80.0–100.0)
Platelets: 186 10*3/uL (ref 150–400)
RBC: 2.87 MIL/uL — ABNORMAL LOW (ref 3.87–5.11)
RDW: 14.2 % (ref 11.5–15.5)
WBC: 12.5 10*3/uL — ABNORMAL HIGH (ref 4.0–10.5)
nRBC: 0 % (ref 0.0–0.2)

## 2023-03-19 LAB — GLUCOSE, CAPILLARY: Glucose-Capillary: 78 mg/dL (ref 70–99)

## 2023-03-19 LAB — CULTURE, BETA STREP (GROUP B ONLY): Strep Gp B Culture: NEGATIVE

## 2023-03-19 MED ORDER — EPINEPHRINE PF 1 MG/ML IJ SOLN: 0.3000 mg | Freq: Once | INTRAMUSCULAR | Status: DC | PRN

## 2023-03-19 MED ORDER — DIPHENHYDRAMINE HCL 50 MG/ML IJ SOLN: 25.0000 mg | Freq: Once | INTRAMUSCULAR | Status: DC | PRN

## 2023-03-19 MED ORDER — SODIUM CHLORIDE 0.9 % IV BOLUS: 500.0000 mL | Freq: Once | INTRAVENOUS | Status: DC | PRN

## 2023-03-19 MED ORDER — ALBUTEROL SULFATE (2.5 MG/3ML) 0.083% IN NEBU: 2.5000 mg | INHALATION_SOLUTION | Freq: Once | RESPIRATORY_TRACT | Status: DC | PRN

## 2023-03-19 MED ORDER — IRON SUCROSE 500 MG IVPB - SIMPLE MED
500.0000 mg | Freq: Once | INTRAVENOUS | Status: AC
  Administered 2023-03-19: 500 mg via INTRAVENOUS
  Filled 2023-03-19: qty 275

## 2023-03-19 MED ORDER — SODIUM CHLORIDE 0.9 % IV SOLN: INTRAVENOUS | Status: DC | PRN

## 2023-03-19 MED ORDER — METHYLPREDNISOLONE SODIUM SUCC 125 MG IJ SOLR: 125.0000 mg | Freq: Once | INTRAMUSCULAR | Status: DC | PRN

## 2023-03-19 MED ORDER — LACTATED RINGERS IV BOLUS
1000.0000 mL | Freq: Once | INTRAVENOUS | Status: AC
  Administered 2023-03-19: 1000 mL via INTRAVENOUS

## 2023-03-19 NOTE — Lactation Note (Signed)
This note was copied from a baby's chart. Lactation Consultation Note RN had interpreter on her phone, LC used it as well. Mom stated this is her 3rd baby and she BF her other 2 for a long time and feels that this baby is BF well and she has no questions or concerns at this time. Mom doesn't feel that she needs help w/BF. Praised mom and encouraged to call if needs assistance.  Patient Name: Kelsey Mcdaniel [id number] Date: 03/19/2023 Age:27 hours Reason for consult: Initial assessment;Early term 37-38.6wks   Maternal Data Does the patient have breastfeeding experience prior to this delivery?: Yes How long did the patient breastfeed?: 2 yrs to her 27 yr old and 1 1/2 yrs to her 27 yr old.  Feeding Mother's Current Feeding Choice: Breast Milk and Formula  LATCH Score                    Lactation Tools Discussed/Used    Interventions Interventions: Breast feeding basics reviewed;Skin to skin;LC Services brochure  Discharge    Consult Status Consult Status: Complete    Demarquis Osley G 03/19/2023, 4:43 AM

## 2023-03-19 NOTE — Progress Notes (Signed)
Just finished trying to get patient up and to the restroom.  Used interpreter [phone] via vocera, she disappeared halfway thru the treatment.  Got patient to sit on edge of bed (orthostatics), she looked pale, started sweating.  She wanted to stand and try, I called for another person to help so there was one person on each side.  Patient stood, and while BP cuff was cycling, patient started swaying and got more pale.  We stopped the BP measurement, got patient back in bed, aborted ambulation.  LR fluids CRI order still valid, so started patient back on LR.  Also got patient CBG prior to giving apple juice and put cool wash cloth on patient's forehead.  Gave patient pain medications. Told patient we would try again later.  When I left room she had baby latched feeding her.

## 2023-03-19 NOTE — Progress Notes (Signed)
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 Had episode of lightheadedness when trying get OOB early this AM. Hgb drop appropriate for EBL. She is feeling well and currently getting IV iron. Foley is in place w/ excellent UOP. tolerating PO and + flatus  Objective: Blood pressure 113/72, pulse 89, temperature 98.7 F (37.1 C), temperature source Axillary, resp. rate 17, height 5\' 4"  (1.626 m), weight 83.5 kg, last menstrual period 06/25/2022, SpO2 98%, unknown if currently breastfeeding.  Physical Exam:  General: alert, cooperative, and no distress Lochia: appropriate Uterine Fundus: firm Incision: pressure dressing in place, clean/dry/intact DVT Evaluation: No evidence of DVT seen on physical exam. Foley in place draining clear urine  Recent Labs    03/19/23 0542 03/19/23 1404  HGB 8.1* 8.4*  HCT 24.4* 25.6*    Assessment/Plan: Postpartum - Contraception: s/p postplacental Mirena IUD insertion - MOF: breast - Rh status: Rh+ - Rubella status: RI - Dispo: anticipate discharge home POD2-4 - Consults: lactation  Neonatal - Female infant, doing well at bedside  3. Acute post operative blood loss anemia - Hgb drop appropriate for EBL - IV iron x1  4. Early GDM - fasting BG normal this AM - plan for 2h GTT at 6wk visit   LOS: 1 day   Lennart Pall 03/19/2023, 9:11 PM

## 2023-03-19 NOTE — Progress Notes (Signed)
INTERIM PROGRESS NOTE  Subjective: Kelsey Mcdaniel is a 27 y.o. G3P3003 s/p LTCS at [redacted]w[redacted]d with PPH (EBL 1197). Received call from Alfredo Bach, RN who reported patient was pale and sweating when trying to ambulate to the bathroom and began swaying. Vitals remained stable.   Objective: Blood pressure 104/66, pulse 75, temperature 98.2 F (36.8 C), temperature source Oral, resp. rate 18, height 5\' 4"  (1.626 m), weight 83.5 kg, last menstrual period 06/25/2022, SpO2 100%, unknown if currently breastfeeding.   Recent Labs    03/18/23 0943  HGB 11.2*  HCT 34.6*    Assessment/Plan: Kelsey Mcdaniel is a 27 y.o. G3P3003 s/p pLCTS at [redacted]w[redacted]d with EBL 1197 after CS. - stat CBC (pending), will replete RBC prn - gave 1L LR bolus - instructed nurse to inform patient not to ambulate without informing nursing   Penne Lash, MD 03/19/2023 6:12 AM

## 2023-03-20 NOTE — Progress Notes (Signed)
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 Denies any further dizziness. Ambulating around the room. Voiding spontaneously and passing flatus. Tolerating regular diet.   Objective: Blood pressure 113/71, pulse 71, temperature 98.4 F (36.9 C), temperature source Oral, resp. rate 16, height 5\' 4"  (1.626 m), weight 83.5 kg, last menstrual period 06/25/2022, SpO2 97%, unknown if currently breastfeeding.  Physical Exam:  General: alert, cooperative, and no distress Lochia: appropriate Uterine Fundus: firm Incision: pressure dressing in place, clean/dry/intact DVT Evaluation: No evidence of DVT seen on physical exam.  Recent Labs    03/19/23 0542 03/19/23 1404  HGB 8.1* 8.4*  HCT 24.4* 25.6*    Assessment/Plan: Postpartum - Contraception: s/p mirena IUD  - MOF: breast - Rh status: Rh+ - Rubella status: RI - Dispo: anticipate discharge home POD3 - Consults: lactation  Neonatal - Doing well, female infant  3. Acute post op blood loss anemia - Hgb appropriate for EBL - s/p IV iron - no longer symptomatic  4. Early GDM - 2h GTT at 6wk PP appt   LOS: 2 days   Lennart Pall 03/20/2023, 3:34 PM

## 2023-03-21 ENCOUNTER — Encounter (HOSPITAL_COMMUNITY): Payer: Self-pay | Admitting: Obstetrics and Gynecology

## 2023-03-21 DIAGNOSIS — D62 Acute posthemorrhagic anemia: Secondary | ICD-10-CM | POA: Diagnosis not present

## 2023-03-21 MED ORDER — OXYCODONE HCL 5 MG PO TABS: 5.0000 mg | ORAL_TABLET | Freq: Four times a day (QID) | ORAL | 0 refills | Status: DC | PRN

## 2023-03-21 MED ORDER — IBUPROFEN 600 MG PO TABS: 600.0000 mg | ORAL_TABLET | Freq: Four times a day (QID) | ORAL | 0 refills | Status: DC | PRN

## 2023-03-21 MED ORDER — INFLUENZA VIRUS VACC SPLIT PF (FLUZONE) 0.5 ML IM SUSY: 0.5000 mL | PREFILLED_SYRINGE | INTRAMUSCULAR | Status: DC

## 2023-03-21 MED ORDER — INFLUENZA VIRUS VACC SPLIT PF (FLUZONE) 0.5 ML IM SUSY
0.5000 mL | PREFILLED_SYRINGE | INTRAMUSCULAR | Status: AC
  Administered 2023-03-21: 0.5 mL via INTRAMUSCULAR
  Filled 2023-03-21: qty 0.5

## 2023-03-25 ENCOUNTER — Encounter (HOSPITAL_COMMUNITY): Payer: Self-pay | Admitting: Obstetrics and Gynecology

## 2023-03-25 DIAGNOSIS — O09299 Supervision of pregnancy with other poor reproductive or obstetric history, unspecified trimester: Secondary | ICD-10-CM

## 2023-03-26 ENCOUNTER — Encounter (HOSPITAL_COMMUNITY): Payer: Self-pay | Admitting: Obstetrics and Gynecology

## 2023-03-27 ENCOUNTER — Ambulatory Visit: Payer: Medicaid Other

## 2023-03-27 VITALS — BP 116/73 | HR 78 | Wt 157.9 lb

## 2023-03-27 DIAGNOSIS — Z5189 Encounter for other specified aftercare: Secondary | ICD-10-CM

## 2023-03-27 NOTE — Progress Notes (Signed)
..   Subjective:     Kelsey Mcdaniel is a 27 y.o. female who presents to the clinic 1 weeks status post  c-section . Eating a regular diet without difficulty. Bowel movements are normal. Pain is controlled with current analgesics. Medications being used: ibuprofen (OTC) and narcotic analgesics including OxyCodone.      Objective:    LMP 06/25/2022 (Exact Date)  General:  alert, cooperative, and mild distress  Abdomen: soft, bowel sounds active, non-tender  Incision:   healing well, no drainage, no erythema, no hernia, no seroma, no swelling, no dehiscence, incision well approximated     Assessment:    Doing well postoperatively. Mild distress from pain related to incision; taking pain medications to help   Plan:    1. Continue any current medications. 2. Wound care discussed. 3. Activity restrictions: No lifting 20lbs or more, No repetitive climbing of stairs at home 4. Anticipated return to work:  to be determined . 5. Follow up: 5 weeks for postpartum visit on 05/03/2023.   Katrina Stack, RN

## 2023-03-28 IMAGING — CR DG CHEST 1V
1 series · 1 of 1 positions shown · non-contrast
Comparison: August 19, 2020

CLINICAL DATA: Positive PPD test.

EXAM:
CHEST  1 VIEW

[w chest pa]
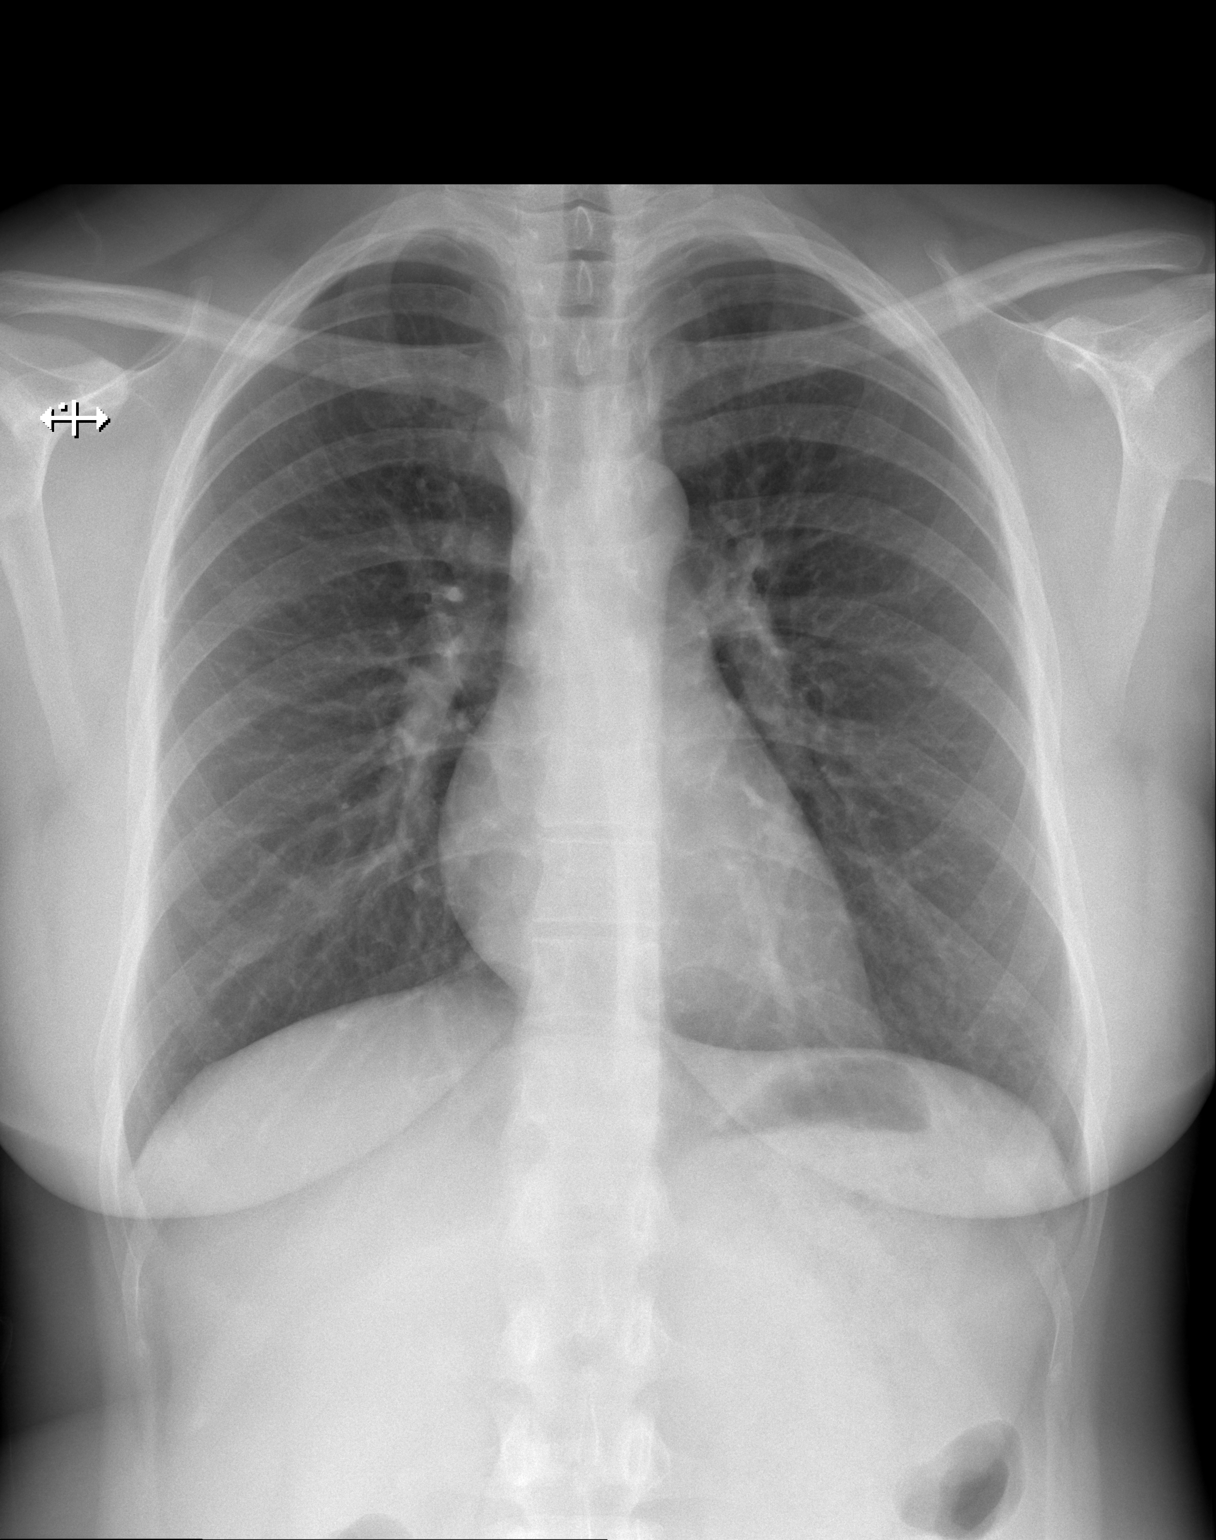

[1 of 1 positions shown; findings below may reference images not displayed]

FINDINGS: The heart size and mediastinal contours are within normal limits.
Both lungs are clear. The visualized skeletal structures are
unremarkable.
IMPRESSION: No active disease.

## 2023-04-01 ENCOUNTER — Other Ambulatory Visit: Payer: Self-pay

## 2023-05-03 ENCOUNTER — Ambulatory Visit: Payer: Medicaid Other | Admitting: Obstetrics and Gynecology

## 2023-05-03 ENCOUNTER — Other Ambulatory Visit: Payer: Medicaid Other

## 2023-05-03 ENCOUNTER — Encounter: Payer: Self-pay | Admitting: Obstetrics and Gynecology

## 2023-05-03 VITALS — BP 105/69 | HR 76 | Ht 64.0 in | Wt 167.0 lb

## 2023-05-03 DIAGNOSIS — Z603 Acculturation difficulty: Secondary | ICD-10-CM

## 2023-05-03 DIAGNOSIS — D62 Acute posthemorrhagic anemia: Secondary | ICD-10-CM

## 2023-05-03 DIAGNOSIS — Z30431 Encounter for routine checking of intrauterine contraceptive device: Secondary | ICD-10-CM | POA: Diagnosis not present

## 2023-05-03 DIAGNOSIS — Z758 Other problems related to medical facilities and other health care: Secondary | ICD-10-CM

## 2023-05-03 DIAGNOSIS — O24415 Gestational diabetes mellitus in pregnancy, controlled by oral hypoglycemic drugs: Secondary | ICD-10-CM

## 2023-05-03 DIAGNOSIS — Z975 Presence of (intrauterine) contraceptive device: Secondary | ICD-10-CM

## 2023-05-03 DIAGNOSIS — Z9889 Other specified postprocedural states: Secondary | ICD-10-CM

## 2023-05-03 DIAGNOSIS — O09299 Supervision of pregnancy with other poor reproductive or obstetric history, unspecified trimester: Secondary | ICD-10-CM

## 2023-05-03 NOTE — Progress Notes (Signed)
Post Partum Visit Note  Kelsey Mcdaniel is a 27 y.o. [id number] female who presents for a postpartum visit. She is 6 weeks postpartum following a primary cesarean section.  I have fully reviewed the prenatal and intrapartum course. The delivery was at 38.0 gestational weeks.  Anesthesia: epidural. Postpartum course has been unremarkable. Baby is doing well. Baby is feeding by breast. Bleeding no bleeding. Bowel function is normal. Bladder function is normal. Patient is sexually active. Contraception method is IUD. Postpartum depression screening: negative.   Upstream - 05/03/23 0915       Pregnancy Intention Screening   Does the patient want to become pregnant in the next year? No    Does the patient's partner want to become pregnant in the next year? No    Would the patient like to discuss contraceptive options today? No      Contraception Wrap Up   Current Method IUD or IUS            The pregnancy intention screening data noted above was reviewed. Potential methods of contraception were discussed. The patient elected to proceed with No data recorded.   Edinburgh Postnatal Depression Scale - 05/03/23 0913       Edinburgh Postnatal Depression Scale:  In the Past 7 Days   I have been able to laugh and see the funny side of things. 0    I have looked forward with enjoyment to things. 0    I have blamed myself unnecessarily when things went wrong. 0    I have been anxious or worried for no good reason. 0    I have felt scared or panicky for no good reason. 0    Things have been getting on top of me. 0    I have been so unhappy that I have had difficulty sleeping. 0    I have felt sad or miserable. 0    I have been so unhappy that I have been crying. 0    The thought of harming myself has occurred to me. 0    Edinburgh Postnatal Depression Scale Total 0             Health Maintenance Due  Topic Date Due   HEMOGLOBIN A1C  Never done   FOOT EXAM  Never done   OPHTHALMOLOGY  EXAM  Never done   Diabetic kidney evaluation - Urine ACR  Never done   COVID-19 Vaccine (3 - 2023-24 season) 02/24/2023    The following portions of the patient's history were reviewed and updated as appropriate: allergies, current medications, past family history, past medical history, past social history, past surgical history, and problem list.  Review of Systems Pertinent items are noted in HPI.  Objective:  BP 105/69   Pulse 76   Ht 5\' 4"  (1.626 m)   Wt 167 lb (75.8 kg)   LMP 06/25/2022 (Exact Date)   Breastfeeding Yes   BMI 28.67 kg/m    General:  alert, cooperative, and appears stated age   Breasts:  not indicated  Lungs: No issues with respiration noted  Heart:  Regular rate  Abdomen: Soft, non-tender, pfannenstiel healing well  Wound well approximated incision  GU exam:  normal, IUD strings trimmed       Assessment:   1. Gestational diabetes mellitus (GDM) controlled on oral hypoglycemic drug, antepartum - Glucose tolerance, 2 hours  2. H/O maternal fourth degree perineal laceration, currently pregnant  3. Postoperative state Doing very well  4. Postpartum  state Doing well  5. Language barrier affecting health care Pashtun interpretor used  6. Acute blood loss anemia CBC today  7. IUD (intrauterine device) in place Trimmed IUD strings   Normal postpartum exam.   Plan:   Essential components of care per ACOG recommendations:  1.  Mood and well being: Patient with negative depression screening today. Reviewed local resources for support.  - Patient tobacco use? No.   - hx of drug use? No.    2. Infant care and feeding:  -Patient currently breastmilk feeding? Yes. Reviewed importance of draining breast regularly to support lactation.  -Social determinants of health (SDOH) reviewed in EPIC. No concerns  3. Sexuality, contraception and birth spacing - Patient does not want a pregnancy in the next year. - Reviewed reproductive life planning.  Reviewed contraceptive methods based on pt preferences and effectiveness.  Patient desired IUD or IUS today.   - Discussed birth spacing of 18 months  4. Sleep and fatigue -Encouraged family/partner/community support of 4 hrs of uninterrupted sleep to help with mood and fatigue  5. Physical Recovery  - Discussed patients delivery and complications. - Patient had a C-section for h/o 4th degree lacteration. Perineal healing reviewed. Patient expressed understanding - Patient has urinary incontinence? No. - Patient is safe to resume physical and sexual activity  6.  Health Maintenance - HM due items addressed Yes - Last pap smear  Diagnosis  Date Value Ref Range Status  09/16/2020   Final   - Negative for intraepithelial lesion or malignancy (NILM)   Pap smear not done at today's visit.  -Breast Cancer screening indicated? No.   7. Chronic Disease/Pregnancy Condition follow up: Anemia  - PCP follow up    [facility] [employer], [facility]

## 2023-05-04 LAB — GLUCOSE TOLERANCE, 2 HOURS
Glucose, 2 hour: 109 mg/dL (ref 70–139)
Glucose, GTT - Fasting: 90 mg/dL (ref 70–99)

## 2023-05-14 NOTE — Progress Notes (Signed)
RTC to pt using Pashto interpreter. Pt informed of normal postpartum GTT. Reminded to follow up with PCP in one year to recheck on blood glucose. Pt verbalized understanding.

## 2023-06-27 ENCOUNTER — Other Ambulatory Visit (HOSPITAL_COMMUNITY)
Admission: RE | Admit: 2023-06-27 | Discharge: 2023-06-27 | Disposition: A | Discharge status: Home / Self Care | Payer: Medicaid Other | Source: Ambulatory Visit | Attending: Family Medicine | Admitting: Family Medicine

## 2023-06-27 ENCOUNTER — Encounter: Payer: Self-pay | Admitting: Family Medicine

## 2023-06-27 ENCOUNTER — Ambulatory Visit: Payer: Medicaid Other | Admitting: Family Medicine

## 2023-06-27 VITALS — BP 110/80 | HR 98 | Ht 64.0 in | Wt 175.6 lb

## 2023-06-27 DIAGNOSIS — N939 Abnormal uterine and vaginal bleeding, unspecified: Secondary | ICD-10-CM | POA: Diagnosis present

## 2023-06-27 LAB — POCT URINE PREGNANCY: Preg Test, Ur: NEGATIVE

## 2023-06-27 LAB — POCT HEMOGLOBIN: Hemoglobin: 12.8 g/dL (ref 11–14.6)

## 2023-06-27 NOTE — Patient Instructions (Addendum)
 It was wonderful to see you today.  Please bring ALL of your medications with you to every visit.   Today we talked about:  For your vaginal bleeding, we checked for infection today. Your hemoglobin is stable, this is good news. Your urine pregnancy test was negative.  Please use condoms until your follow up appointment or abstain from intercourse until your IUD placement.  Thank you for choosing [facility].   Please call [phone] with any questions about today's appointment.  Please arrive at least 15 minutes prior to your scheduled appointments.   If you had blood work today, I will send you a MyChart message or a letter if results are normal. Otherwise, I will give you a call.   If you had a referral placed, they will call you to set up an appointment. Please give us  a call if you don't hear back in the next 2 weeks.   If you need additional refills before your next appointment, please call your pharmacy first.   Rollene Keeling, MD  Family Medicine

## 2023-06-27 NOTE — Progress Notes (Signed)
    SUBJECTIVE:   CHIEF COMPLAINT / HPI:   Vaginal bleeding- had PLTCS at 8 WGA. Has Mirena  IUD placed during PLTCS. EBL 1100 mL during her Cesarean, had acute blood anemia post op and received IV Venofer  x1.  Last Hgb 8.3 in Sept 2024. Denies dizziness, lightheadedness, shortness of breath. Just notes spotting daily for two months since delivery, now coming and going for past month. Her IUD fell out 06/17/23, they found it in toilet, would like to have it replaced. Last intercourse yesterday, a little sore but not terribly painful. No vaginal discharge, no fevers, no chills. No dysuri or blood in urine. Some abdominal cramping. She is exclusively breastfeeding every 2-3 hours. She notes Ramadan starts March 1 and she wants to be done bleeding by then. She would like IUD replaced but does not want to take pills or other form of contraception.  OBJECTIVE:   BP 110/80   Pulse 98   Ht 5' 4 (1.626 m)   Wt 175 lb 9.6 oz (79.7 kg)   SpO2 95%   BMI 30.14 kg/m   General: A&O, NAD HEENT: No sign of trauma, EOM grossly intact Cardiac: RRR, no m/r/g Respiratory: CTAB, normal WOB, no w/c/r GI: Soft, NTTP, non-distended , + bowel sounds, no guarding or rebound, no CVA tenderness Extremities: NTTP, no peripheral edema. Neuro: Normal gait, moves all four extremities appropriately. Psych: Appropriate mood and affect   GU: Normal appearance of labia majora and minora, without lesions. Vagina tissue pink, moist, without lesions or abrasions. Cervix normal appearance, non-friable, without discharge from os. NO bleeding noted. No cervical motion tenderness on my exam.   Harlene Reiter CMA present as chaperone for pelvic exam.    ASSESSMENT/PLAN:   Assessment & Plan Vaginal bleeding POC Hgb today 12.8, reassuring and recovered from delivery prior, asymptomatic without anemia Urine preg negative today, appt scheduled for IUD placement 07/08/23 with myself, discussing using back up contraception vs  abstinence until then offered other forms and they elected for condoms Swabs  collected to rule out infection, no cervical motion tenderness or signs of pelvic infection on exam today Did discuss could still have irregular bleeding with IUD, offered pills or other forms of contraception but she would prefer IUD and does not want to interfere with breastfeeding.     Rollene FORBES Keeling, MD [facility]

## 2023-07-02 LAB — CERVICOVAGINAL ANCILLARY ONLY
Bacterial Vaginitis (gardnerella): NEGATIVE
Candida Glabrata: NEGATIVE
Candida Vaginitis: NEGATIVE
Chlamydia: NEGATIVE
Comment: NEGATIVE
Comment: NEGATIVE
Comment: NEGATIVE
Comment: NEGATIVE
Comment: NEGATIVE
Comment: NORMAL
Neisseria Gonorrhea: NEGATIVE
Trichomonas: NEGATIVE

## 2023-07-08 ENCOUNTER — Telehealth: Payer: Self-pay | Admitting: Family Medicine

## 2023-07-08 ENCOUNTER — Ambulatory Visit: Payer: Medicaid Other | Admitting: Family Medicine

## 2023-07-08 NOTE — Progress Notes (Deleted)
    SUBJECTIVE:   CHIEF COMPLAINT / HPI:   IUD replacement- no strings noted on previous exam and picture of IUD she found that fell out post-operatively in December.  PERTINENT  PMH / PSH: ***  OBJECTIVE:   There were no vitals taken for this visit.  General: A&O, NAD HEENT: No sign of trauma, EOM grossly intact Cardiac: RRR, no m/r/g Respiratory: CTAB, normal WOB, no w/c/r GI: Soft, NTTP, non-distended  Extremities: NTTP, no peripheral edema. Neuro: Normal gait, moves all four extremities appropriately. Psych: Appropriate mood and affect   ASSESSMENT/PLAN:   Assessment & Plan Encounter for IUD insertion      Rollene FORBES Keeling, MD [facility]

## 2023-07-08 NOTE — Telephone Encounter (Signed)
 Called and spoke with patient, < 2 weeks since seen last and need to move IUD appointment in order to be reasonably certain she is not pregnant. She notes she is on her menses now, first day LMP was 07/04/22. She has been using condoms or abstinent since our last appt. Discussed continuing this until follow up appointment on Friday 07/11/22 at 930 AM. Answered all questions.  Rollene Keeling MD

## 2023-07-12 ENCOUNTER — Other Ambulatory Visit (HOSPITAL_COMMUNITY)
Admission: RE | Admit: 2023-07-12 | Discharge: 2023-07-12 | Disposition: A | Discharge status: Home / Self Care | Payer: Medicaid Other | Source: Ambulatory Visit | Attending: Family Medicine | Admitting: Family Medicine

## 2023-07-12 ENCOUNTER — Other Ambulatory Visit: Payer: Self-pay | Admitting: Family Medicine

## 2023-07-12 ENCOUNTER — Ambulatory Visit: Payer: Medicaid Other | Admitting: Family Medicine

## 2023-07-12 VITALS — BP 118/75 | HR 95 | Temp 97.8°F | Ht 64.0 in | Wt 177.0 lb

## 2023-07-12 DIAGNOSIS — Z3009 Encounter for other general counseling and advice on contraception: Secondary | ICD-10-CM

## 2023-07-12 DIAGNOSIS — R058 Other specified cough: Secondary | ICD-10-CM

## 2023-07-12 DIAGNOSIS — Z124 Encounter for screening for malignant neoplasm of cervix: Secondary | ICD-10-CM | POA: Diagnosis not present

## 2023-07-12 DIAGNOSIS — Z30430 Encounter for insertion of intrauterine contraceptive device: Secondary | ICD-10-CM | POA: Diagnosis present

## 2023-07-12 LAB — POCT URINE PREGNANCY: Preg Test, Ur: NEGATIVE

## 2023-07-12 MED ORDER — FAMOTIDINE 40 MG PO TABS
40.0000 mg | ORAL_TABLET | Freq: Every day | ORAL | 1 refills | Status: DC
Start: 2023-07-12 — End: 2023-07-12

## 2023-07-12 MED ORDER — NORETHINDRONE 0.35 MG PO TABS: 1.0000 | ORAL_TABLET | Freq: Every day | ORAL | 11 refills | Status: DC

## 2023-07-12 NOTE — Assessment & Plan Note (Addendum)
Negative upreg and no intercourse since menses can be reasonably certain not pregnant Procedure attempted and discontinued due to patient discomfort with likely sound attempting to pass through cervical os, unable to sound to > 5 cm and thus unable to place IUD today Pt with mild cramping but benign abdominal exam after procedure and feeling otherwise well, monitored for 15 minutes after procedure, discussed other forms of contraception as noted below elected for POPs Strict return precautions discussed including bleeding through heavier than 1 pad per hour, vaginal discharge/fevers/chills, severe abdominal pain, dizziness or lightheadedness

## 2023-07-12 NOTE — Patient Instructions (Addendum)
It was wonderful to see you today.  Please bring ALL of your medications with you to every visit.   Today we talked about:  We tried to place your IUD, and we were unable to get the uterine sound in to be able to safely place it. I have placed a referral to OB GYN to have this replaced.   I sent in birth control pills.  If you start having severe abdominal pain, fevers, chills, vaginal discharge, heavy bleeding going through more than 1-2 pads in an hour, please go to the emergency room  I have sent in famotidine as needed for acid reflux  Please use back up contraception for one week.   Thank you for choosing [facility].   Please call [phone] with any questions about today's appointment.  Please arrive at least 15 minutes prior to your scheduled appointments.   If you had blood work today, I will send you a MyChart message or a letter if results are normal. Otherwise, I will give you a call.   If you had a referral placed, they will call you to set up an appointment. Please give [place] a call if you don't hear back in the next 2 weeks.   If you need additional refills before your next appointment, please call your pharmacy first.   Burley Saver, MD  Family Medicine

## 2023-07-12 NOTE — Progress Notes (Addendum)
SUBJECTIVE:   CHIEF COMPLAINT / HPI:   IUD placement- pt presents for IUD placement today. LMP 1/10-/1/16, was like normal menses, last sexually active prior to menses and has been using condoms. Counseled on risks of irregular bleeding, infection, pain, uterine perforation/migration, risk of expulsion, overall lower risk of pregnancy but increased risk if pregnant of ectopic pregnancy, and pt agreeable to proceed. Discussed other contraceptive options and she declines at this time.  IUD Intrauterine Device Insertion Procedure Note PRE-OP DIAGNOSIS: desired long-term, reversible contraception  POST-OP DIAGNOSIS: failed placement of IUD, pt unable to tolerate PROCEDURE: attempted Mirena IUD placement, procedure not completed due to patient discomfort Performing Physician: Dr Miquel Dunn  IUD type: Mirena  PROCEDURE:  Timeout procedure was performed to ensure right patient and right site.   The speculum was placed. The vagina and cervix was sterilized in the usual manner and sterile technique was maintained throughout the course of the procedure. A single toothed tenaculum was applied to the posterior lip of the cervix and gentle traction applied to straighten and stabilize it. The depth of the uterus was attempted to be sounded, however patient had pain with uterine sound and unable to advance > 5 cm. Pt opted for one further attempt but again was uncomfortable and declined further attempts and procedure was discontinued and IUD not placed.   Follow-up: The patient tolerated the procedure well without complications.  Standard post-procedure care was explained and return precautions were given including bleeding heavier than 1 pad per hour, vaginal discharge/fevers/chills. Counseling provided on other forms of contraception and she prefers progesterone only pills.   Coughing- x 3 weeks, had fevers subjective a few weeks ago, now resolved but continues to cough. Some rib pain with coughing, no chest  pain or pleuritic pain with deep breath and no chest pain or shortness of breath when ambulating. NO leg swelling, no hemoptysis, no sputum production.   OBJECTIVE:   BP 118/75   Pulse 95   Temp 97.8 F (36.6 C)   Ht 5\' 4"  (1.626 m)   Wt 177 lb (80.3 kg)   SpO2 98%   BMI 30.38 kg/m   General: A&O, NAD HEENT: No sign of trauma, EOM grossly intact Cardiac: RRR, no m/r/g Respiratory: CTAB, normal WOB, no w/c/r GI: Soft, NTTP, non-distended , no guarding or rebound Extremities: NTTP, no peripheral edema. Neuro: Normal gait, moves all four extremities appropriately. Psych: Appropriate mood and affect   GU: Normal appearance of labia majora and minora, without lesions. Vagina tissue pink, moist, without lesions or abrasions. Cervix normal appearance, non-friable, without discharge from os. After procedure, tenaculum site noted to be hemostatic.    Gilberto Better CMA present as chaperone for pelvic exam.    ASSESSMENT/PLAN:   Assessment & Plan Encounter for insertion of Mirena IUD Negative upreg and no intercourse since menses can be reasonably certain not pregnant Procedure attempted and discontinued due to patient discomfort with likely sound attempting to pass through cervical os, unable to sound to > 5 cm and thus unable to place IUD today Pt with mild cramping but benign abdominal exam after procedure and feeling otherwise well, monitored for 15 minutes after procedure, discussed other forms of contraception as noted below elected for POPs Strict return precautions discussed including bleeding through heavier than 1 pad per hour, vaginal discharge/fevers/chills, severe abdominal pain, dizziness or lightheadedness Encounter for Papanicolaou smear for cervical cancer screening Pap smear completed today, pt declined STI testing.  Post-viral cough syndrome Pt with lingering  3 weeks cough after viral syndrome 3 weeks ago, had fevers three weeks ago and feeling better now none  since NO chest pain or dyspnea, some pain with coughing but none reproducible on exam today, no leg swelling, sputum or hemoptysis, 98% on room air today, discussed low suspicion for pneumonia Discussed honey and supportive measures, and signs/symptoms to watch for Encounter for counseling regarding contraception Attempted IUD unable to place due to patient pain with sound and unable to sound > 5 cm Elected for progesterone only pills sent to pharmacy, explained importance of taking same time every day, discussed other options including Nexplanon and Depo and she declined at this time   Billey Co, MD [facility]

## 2023-07-18 ENCOUNTER — Encounter: Payer: Self-pay | Admitting: Family Medicine

## 2023-07-18 LAB — CYTOLOGY - PAP: Diagnosis: NEGATIVE

## 2023-07-19 MED ORDER — LEVONORGESTREL 20 MCG/DAY IU IUD
1.0000 | INTRAUTERINE_SYSTEM | Freq: Once | INTRAUTERINE | Status: DC
Start: 2023-07-19 — End: 2023-11-25

## 2023-07-19 NOTE — Addendum Note (Signed)
Addended by: Veronda Prude on: 07/19/2023 04:23 PM   Modules accepted: Orders

## 2023-07-29 ENCOUNTER — Ambulatory Visit: Payer: Medicaid Other | Admitting: Family Medicine

## 2023-11-25 ENCOUNTER — Ambulatory Visit: Admitting: Family Medicine

## 2023-11-25 ENCOUNTER — Encounter: Payer: Self-pay | Admitting: Family Medicine

## 2023-11-25 ENCOUNTER — Ambulatory Visit: Payer: Self-pay | Admitting: Family Medicine

## 2023-11-25 VITALS — BP 100/84 | HR 84 | Ht 64.0 in | Wt 186.0 lb

## 2023-11-25 DIAGNOSIS — K219 Gastro-esophageal reflux disease without esophagitis: Secondary | ICD-10-CM

## 2023-11-25 DIAGNOSIS — M722 Plantar fascial fibromatosis: Secondary | ICD-10-CM | POA: Diagnosis not present

## 2023-11-25 DIAGNOSIS — Z8632 Personal history of gestational diabetes: Secondary | ICD-10-CM

## 2023-11-25 DIAGNOSIS — Z862 Personal history of diseases of the blood and blood-forming organs and certain disorders involving the immune mechanism: Secondary | ICD-10-CM | POA: Diagnosis not present

## 2023-11-25 DIAGNOSIS — L811 Chloasma: Secondary | ICD-10-CM

## 2023-11-25 LAB — POCT GLYCOSYLATED HEMOGLOBIN (HGB A1C): HbA1c, POC (prediabetic range): 5.6 % — AB (ref 5.7–6.4)

## 2023-11-25 MED ORDER — DICLOFENAC SODIUM 1 % EX GEL: 4.0000 g | Freq: Four times a day (QID) | CUTANEOUS | 1 refills | Status: AC | PRN

## 2023-11-25 MED ORDER — FAMOTIDINE 40 MG PO TABS: 40.0000 mg | ORAL_TABLET | Freq: Every day | ORAL | 3 refills | Status: AC

## 2023-11-25 NOTE — Assessment & Plan Note (Addendum)
 A1c today 5.6, prediabetes, discussed diet and exercise

## 2023-11-25 NOTE — Progress Notes (Signed)
    SUBJECTIVE:   CHIEF COMPLAINT / HPI:   History of gestational diabetes- she had normal 2 hr GTT postpartum six months ago. Wanting to be checked again and check her cholesterol as she has gained weight.   Heel pain- for past month, bilateral feet when she gets out of bed are so painful at her heel that she can hardly walk, then improve as she walks some. No trauma or inciting incident. Is barefoot in house or wearing sandals often. Never had before. Has note tried anything for this. Points to pad of heel as site of tenderness. Denies feet turning blue or red. Gets better as the day goes on.  Melasma- on bilateral cheeks, wondering what she can do for this.  GERD- takes pepcid  daily, worse after eating onions. Previously tested for H pylori and was negative.  PERTINENT  PMH / PSH: h/o of gestational diabetes  OBJECTIVE:   BP 100/84   Pulse 84   Ht 5\' 4"  (1.626 m)   Wt 186 lb (84.4 kg)   SpO2 99%   BMI 31.93 kg/m   General: alert & oriented, no apparent distress, well groomed HEENT: normocephalic, atraumatic, EOM grossly intact, oral mucosa moist, neck supple Respiratory: normal respiratory effort GI: non-distended Skin: hyperpigmentation on bilteral cheeks  Psych: appropriate mood and affect MSK: bilateral heel pads mildy TTP, no erythema or swelling, no ankle tenderness   ASSESSMENT/PLAN:   Assessment & Plan History of gestational diabetes A1c today 5.6, prediabetes, discussed diet and exercise Gastroesophageal reflux disease, unspecified whether esophagitis present Refilled pepcid  History of anemia Check CBC and ferritin today Plantar fasciitis, bilateral Discussed supportive footwear including in house, stretches demonstrated and given, PRN diclofenac , using tennis ball or frozen water  bottle to massage bottom of foot F/u in 2-4 weeks if not improved, consider injection with sports med Melasma Mild, discussed sunscreen use     Charmel Cooter, MD [facility]  [facility]

## 2023-11-25 NOTE — Patient Instructions (Addendum)
 It was wonderful to see you today.  Please bring ALL of your medications with you to every visit.   Today we talked about:  Your A1c shows prediaebtes. Continue to work on diet and exercise.  Your heel pain is from plantar fascitis. Can do tennis ball, frozen water  bottle, and stretches I gave you. Also use diclofenac  gel up to four times a day on your heel. Get supportive shoes with thick soles to wear inside the house!  Wear sunscreen for your melasma.  We will check your anemia and iron  levels today.  Thank you for choosing [facility].   Please call [phone] with any questions about today's appointment.  Please arrive at least 15 minutes prior to your scheduled appointments.   If you had blood work today, I will send you a MyChart message or a letter if results are normal. Otherwise, I will give you a call.   If you had a referral placed, they will call you to set up an appointment. Please give us  a call if you don't hear back in the next 2 weeks.   If you need additional refills before your next appointment, please call your pharmacy first.   Avanell Bob, MD  Family Medicine

## 2023-11-26 LAB — CBC
Hematocrit: 39.9 % (ref 34.0–46.6)
Hemoglobin: 12.9 g/dL (ref 11.1–15.9)
MCH: 28.5 pg (ref 26.6–33.0)
MCHC: 32.3 g/dL (ref 31.5–35.7)
MCV: 88 fL (ref 79–97)
Platelets: 335 10*3/uL (ref 150–450)
RBC: 4.52 x10E6/uL (ref 3.77–5.28)
RDW: 12.9 % (ref 11.7–15.4)
WBC: 9.3 10*3/uL (ref 3.4–10.8)

## 2023-11-26 LAB — LIPID PANEL
Chol/HDL Ratio: 4.6 ratio — ABNORMAL HIGH (ref 0.0–4.4)
Cholesterol, Total: 143 mg/dL (ref 100–199)
HDL: 31 mg/dL — ABNORMAL LOW (ref >39)
LDL Chol Calc (NIH): 86 mg/dL (ref 0–99)
Triglycerides: 145 mg/dL (ref 0–149)
VLDL Cholesterol Cal: 26 mg/dL (ref 5–40)

## 2023-11-26 LAB — FERRITIN: Ferritin: 40 ng/mL (ref 15–150)

## 2023-11-29 NOTE — Progress Notes (Signed)
 Called pt with Pashto interpreter. Discussed normal lab results. NO need for iron  or cholesterol medications at this time. Answered all questions and concerns.

## 2024-07-27 ENCOUNTER — Ambulatory Visit: Admitting: Family Medicine
